# Patient Record
Sex: Male | Born: 1955
Health system: Southern US, Community
[De-identification: ages and names within clinical notes are randomized; demographics above are authoritative.]

## PROBLEM LIST (undated history)

## (undated) DIAGNOSIS — C9000 Multiple myeloma not having achieved remission: Secondary | ICD-10-CM

## (undated) DIAGNOSIS — IMO0002 Reserved for concepts with insufficient information to code with codable children: Secondary | ICD-10-CM

## (undated) DIAGNOSIS — M899 Disorder of bone, unspecified: Secondary | ICD-10-CM

## (undated) DIAGNOSIS — M1990 Unspecified osteoarthritis, unspecified site: Secondary | ICD-10-CM

## (undated) HISTORY — DX: Multiple myeloma not having achieved remission: C90.00

## (undated) HISTORY — DX: Disorder of bone, unspecified: M89.9

## (undated) HISTORY — DX: Unspecified osteoarthritis, unspecified site: M19.90

## (undated) HISTORY — DX: Reserved for concepts with insufficient information to code with codable children: IMO0002

## (undated) HISTORY — PX: NECK SURGERY: SHX720

## (undated) HISTORY — PX: OTHER SURGICAL HISTORY: SHX169

---

## 2012-06-13 ENCOUNTER — Ambulatory Visit: Payer: Self-pay

## 2012-06-13 LAB — DOT URINE DIP
Blood: NEGATIVE
Glucose,UR: NEGATIVE mg/dL (ref 0–75)
Specific Gravity: 1.01 (ref 1.003–1.030)

## 2014-10-05 ENCOUNTER — Ambulatory Visit: Payer: Self-pay

## 2015-10-12 ENCOUNTER — Encounter: Payer: Self-pay | Admitting: Podiatry

## 2015-10-12 ENCOUNTER — Ambulatory Visit (INDEPENDENT_AMBULATORY_CARE_PROVIDER_SITE_OTHER): Payer: BLUE CROSS/BLUE SHIELD | Admitting: Podiatry

## 2015-10-12 VITALS — BP 115/72 | HR 62 | Resp 18

## 2015-10-12 DIAGNOSIS — L603 Nail dystrophy: Secondary | ICD-10-CM | POA: Diagnosis not present

## 2015-10-12 DIAGNOSIS — B353 Tinea pedis: Secondary | ICD-10-CM

## 2015-10-12 DIAGNOSIS — B351 Tinea unguium: Secondary | ICD-10-CM

## 2015-10-12 NOTE — Addendum Note (Signed)
Addended by: Cranford Mon R on: 10/12/2015 04:14 PM   Modules accepted: Orders

## 2015-10-12 NOTE — Progress Notes (Signed)
   Subjective:    Patient ID: Kyle Parker, male    DOB: 1956/03/29, 60 y.o.   MRN: [id number]  HPI  60 year old male presents the operative concerns of bilateral big toenail thickening and discoloration. He states the nails are painful with shoe gear and pressure. He's had no previous treatment for this. He does state his of athlete's foot previously has tried some over-the-counter sprays which help. No other complaints at this time.   Review of Systems  All other systems reviewed and are negative.      Objective:   Physical Exam General: AAO x3, NAD  Dermatological: Bilateral hallux nails are hypertrophic, dystrophic, brittle, discolored, elongated 2. There is no swelling erythema or drainage. There is no tenderness palpation at this time. There does appear to be evidence of mild tinea pedis in the first interspace bilaterally. No open lesions.   Vascular: Dorsalis Pedis artery and Posterior Tibial artery pedal pulses are 2/4 bilateral with immedate capillary fill time. Pedal hair growth present. No varicosities and no lower extremity edema present bilateral. There is no pain with calf compression, swelling, warmth, erythema.   Neruologic: Grossly intact via light touch bilateral. Vibratory intact via tuning fork bilateral. Protective threshold with Semmes Wienstein monofilament intact to all pedal sites bilateral. Patellar and Achilles deep tendon reflexes 2+ bilateral. No Babinski or clonus noted bilateral.   Musculoskeletal: No gross boney pedal deformities bilateral. No pain, crepitus, or limitation noted with foot and ankle range of motion bilateral. Muscular strength 5/5 in all groups tested bilateral.  Gait: Unassisted, Nonantalgic.      Assessment & Plan:  60 year old male bilateral hallux onychodystrophy, likely onychomycosis; tinea pedis -Treatment options discussed including all alternatives, risks, and complications -Etiology of symptoms were discussed -Bilateral hallux  and debrided and sent for biopsy. Discussed treatment options. Likely undergo a name so. Discussed with him risks and effects. -Recommended over-the-counter athlete's foot spray/cream for now. -Follow-up after the nail culture.  Celesta Gentile, DPM

## 2015-10-12 NOTE — Patient Instructions (Signed)
Onychomycosis/Fungal Toenails  WHAT IS IT? An infection that lies within the keratin of your nail plate that is caused by a fungus.  WHY ME? Fungal infections affect all ages, sexes, races, and creeds.  There may be many factors that predispose you to a fungal infection such as age, coexisting medical conditions such as diabetes, or an autoimmune disease; stress, medications, fatigue, genetics, etc.  Bottom line: fungus thrives in a warm, moist environment and your shoes offer such a location.  IS IT CONTAGIOUS? Theoretically, yes.  You do not want to share shoes, nail clippers or files with someone who has fungal toenails.  Walking around barefoot in the same room or sleeping in the same bed is unlikely to transfer the organism.  It is important to realize, however, that fungus can spread easily from one nail to the next on the same foot.  HOW DO WE TREAT THIS?  There are several ways to treat this condition.  Treatment may depend on many factors such as age, medications, pregnancy, liver and kidney conditions, etc.  It is best to ask your doctor which options are available to you.  1. No treatment.   Unlike many other medical concerns, you can live with this condition.  However for many people this can be a painful condition and may lead to ingrown toenails or a bacterial infection.  It is recommended that you keep the nails cut short to help reduce the amount of fungal nail. 2. Topical treatment.  These range from herbal remedies to prescription strength nail lacquers.  About 40-50% effective, topicals require twice daily application for approximately 9 to 12 months or until an entirely new nail has grown out.  The most effective topicals are medical grade medications available through physicians offices. 3. Oral antifungal medications.  With an 80-90% cure rate, the most common oral medication requires 3 to 4 months of therapy and stays in your system for a year as the new nail grows out.  Oral  antifungal medications do require blood work to make sure it is a safe drug for you.  A liver function panel will be performed prior to starting the medication and after the first month of treatment.  It is important to have the blood work performed to avoid any harmful side effects.  In general, this medication safe but blood work is required. 4. Laser Therapy.  This treatment is performed by applying a specialized laser to the affected nail plate.  This therapy is noninvasive, fast, and non-painful.  It is not covered by insurance and is therefore, out of pocket.  The results have been very good with a 80-95% cure rate.  [facility] is the only practice in the area to offer this therapy. Permanent Nail Avulsion.  Removing the entire nail so that a new nail will not grow back.Athlete's Foot Athlete's foot (tinea pedis) is a fungal infection of the skin on the feet. It often occurs on the skin between the toes or underneath the toes. It can also occur on the soles of the feet. Athlete's foot is more likely to occur in hot, humid weather. Not washing your feet or changing your socks often enough can contribute to athlete's foot. The infection can spread from person to person (contagious). CAUSES Athlete's foot is caused by a fungus. This fungus thrives in warm, moist places. Most people get athlete's foot by sharing shower stalls, towels, and wet floors with an infected person. People with weakened immune systems, including  those with diabetes, may be more likely to get athlete's foot. SYMPTOMS  5. Itchy areas between the toes or on the soles of the feet. 6. White, flaky, or scaly areas between the toes or on the soles of the feet. 7. Tiny, intensely itchy blisters between the toes or on the soles of the feet. 8. Tiny cuts on the skin. These cuts can develop a bacterial infection. 9. Thick or discolored toenails. DIAGNOSIS  Your caregiver can usually tell what the problem is by doing a physical  exam. Your caregiver may also take a skin sample from the rash area. The skin sample may be examined under a microscope, or it may be tested to see if fungus will grow in the sample. A sample may also be taken from your toenail for testing. TREATMENT  Over-the-counter and prescription medicines can be used to kill the fungus. These medicines are available as powders or creams. Your caregiver can suggest medicines for you. Fungal infections respond slowly to treatment. You may need to continue using your medicine for several weeks. PREVENTION   Do not share towels.  Wear sandals in wet areas, such as shared locker rooms and shared showers.  Keep your feet dry. Wear shoes that allow air to circulate. Wear cotton or wool socks. HOME CARE INSTRUCTIONS   Take medicines as directed by your caregiver. Do not use steroid creams on athlete's foot.  Keep your feet clean and cool. Wash your feet daily and dry them thoroughly, especially between your toes.  Change your socks every day. Wear cotton or wool socks. In hot climates, you may need to change your socks 2 to 3 times per day.  Wear sandals or canvas tennis shoes with good air circulation.  If you have blisters, soak your feet in Burow's solution or Epsom salts for 20 to 30 minutes, 2 times a day to dry out the blisters. Make sure you dry your feet thoroughly afterward. SEEK MEDICAL CARE IF:   You have a fever.  You have swelling, soreness, warmth, or redness in your foot.  You are not getting better after 7 days of treatment.  You are not completely cured after 30 days.  You have any problems caused by your medicines. MAKE SURE YOU:   Understand these instructions.  Will watch your condition.  Will get help right away if you are not doing well or get worse.   This information is not intended to replace advice given to you by your health care provider. Make sure you discuss any questions you have with your health care provider.    Document Released: 07/21/2000 Document Revised: 10/16/2011 Document Reviewed: 01/25/2015 Elsevier Interactive Patient Education 2016 Elsevier Inc. Terbinafine oral granules What is this medicine? TERBINAFINE (TER bin a feen) is an antifungal medicine. It is used to treat certain kinds of fungal or yeast infections. This medicine may be used for other purposes; ask your health care provider or pharmacist if you have questions. What should I tell my health care provider before I take this medicine? They need to know if you have any of these conditions: -drink alcoholic beverages -kidney disease -liver disease -an unusual or allergic reaction to Terbinafine, other medicines, foods, dyes, or preservatives -pregnant or trying to get pregnant -breast-feeding How should I use this medicine? Take this medicine by mouth. Follow the directions on the prescription label. Hold packet with cut line on top. Shake packet gently to settle contents. Tear packet open along cut line, or use scissors  to cut across line. Carefully pour the entire contents of packet onto a spoonful of a soft food, such as pudding or other soft, non-acidic food such as mashed potatoes (do NOT use applesauce or a fruit-based food). If two packets are required for each dose, you may either sprinkle the content of both packets on one spoonful of non-acidic food, or sprinkle the contents of both packets on two spoonfuls of non-acidic food. Make sure that no granules remain in the packet. Swallow the mxiture of the food and granules without chewing. Take your medicine at regular intervals. Do not take it more often than directed. Take all of your medicine as directed even if you think you are better. Do not skip doses or stop your medicine early. Contact your pediatrician or health care professional regarding the use of this medicine in children. While this medicine may be prescribed for children as young as 4 years for selected conditions,  precautions do apply. Overdosage: If you think you have taken too much of this medicine contact a poison control center or emergency room at once. NOTE: This medicine is only for you. Do not share this medicine with others. What if I miss a dose? If you miss a dose, take it as soon as you can. If it is almost time for your next dose, take only that dose. Do not take double or extra doses. What may interact with this medicine? Do not take this medicine with any of the following medications: -thioridazine This medicine may also interact with the following medications: -beta-blockers -caffeine -cimetidine -cyclosporine -MAOIs like Carbex, Eldepryl, Marplan, Nardil, and Parnate -medicines for fungal infections like fluconazole and ketoconazole -medicines for irregular heartbeat like amiodarone, flecainide and propafenone -rifampin -SSRIs like citalopram, escitalopram, fluoxetine, fluvoxamine, paroxetine and sertraline -tricyclic antidepressants like amitriptyline, clomipramine, desipramine, imipramine, nortriptyline, and others -warfarin This list may not describe all possible interactions. Give your health care provider a list of all the medicines, herbs, non-prescription drugs, or dietary supplements you use. Also tell them if you smoke, drink alcohol, or use illegal drugs. Some items may interact with your medicine. What should I watch for while using this medicine? Your doctor may monitor your liver function. Tell your doctor right away if you have nausea or vomiting, loss of appetite, stomach pain on your right upper side, yellow skin, dark urine, light stools, or are over tired. You need to take this medicine for 6 weeks or longer to cure the fungal infection. Take your medicine regularly for as long as your doctor or health care professional tells you to. What side effects may I notice from receiving this medicine? Side effects that you should report to your doctor or health care  professional as soon as possible: -allergic reactions like skin rash or hives, swelling of the face, lips, or tongue -change in vision -dark urine -fever or infection -general ill feeling or flu-like symptoms -light-colored stools -loss of appetite, nausea -redness, blistering, peeling or loosening of the skin, including inside the mouth -right upper belly pain -unusually weak or tired -yellowing of the eyes or skin Side effects that usually do not require medical attention (report to your doctor or health care professional if they continue or are bothersome): -changes in taste -diarrhea -hair loss -muscle or joint pain -stomach upset This list may not describe all possible side effects. Call your doctor for medical advice about side effects. You may report side effects to FDA at 1-800-FDA-1088. Where should I keep my medicine? Keep out  of the reach of children. Store at room temperature between 15 and 30 degrees C (59 and 86 degrees F). Throw away any unused medicine after the expiration date. NOTE: This sheet is a summary. It may not cover all possible information. If you have questions about this medicine, talk to your doctor, pharmacist, or health care provider.    2016, Elsevier/Gold Standard. (2007-10-04 17:25:48)

## 2015-11-12 ENCOUNTER — Encounter: Payer: Self-pay | Admitting: Podiatry

## 2015-12-06 ENCOUNTER — Telehealth: Payer: Self-pay | Admitting: *Deleted

## 2015-12-06 DIAGNOSIS — Z79899 Other long term (current) drug therapy: Secondary | ICD-10-CM

## 2015-12-06 NOTE — Telephone Encounter (Signed)
I discussed with him lamisil. OK to go ahead and do 30 days of lamisil but he needs to come by to pick up lab work for a CBC with diff and liver function tests. He should not start the medication until I call him with the results of the blood work. Follow-up with me 4 weeks after starting lamisil.

## 2015-12-06 NOTE — Telephone Encounter (Addendum)
Pt states he had a fungal culture in March and had not heard the results.  I informed pt's results from Dr. Leigh Aurora review, as + and that pt should schedule for an appt.  Pt states Dr. Jacqualyn Posey said he would not have to come back that Dr. Jacqualyn Posey would prescribe and instruct over the phone.  Pt asked how long the results had been in and I told him 10/30/2015 and explained the results had come into [place] office and had been reviewed there and Dr. Leigh Aurora results and instruction should have been called to me or faxed.  I apologized and told pt I would inform Dr. Jacqualyn Posey and call with instructions.  12/07/2015-I informed pt, Dr. Jacqualyn Posey states have blood work performed and we would call with results and then he may be cleared to begin Lamisil for #30 doses, and will need to be seen in office after 30 doses to discuss pt's status and further orders.  Pt states understanding. Orders for [facility] at [facility] faxed.  12/13/2015-Informed pt of Dr. Leigh Aurora orders.

## 2015-12-09 MED ORDER — TERBINAFINE HCL 250 MG PO TABS: 250.0000 mg | ORAL_TABLET | Freq: Every day | ORAL | Status: DC

## 2015-12-09 NOTE — Addendum Note (Signed)
Addended by: Cranford Mon R on: 12/09/2015 05:06 PM   Modules accepted: Orders

## 2015-12-10 DIAGNOSIS — B351 Tinea unguium: Secondary | ICD-10-CM | POA: Diagnosis not present

## 2015-12-11 LAB — CBC WITH DIFFERENTIAL/PLATELET
BASOS ABS: 0 10*3/uL (ref 0.0–0.2)
Basos: 0 %
EOS (ABSOLUTE): 0.1 10*3/uL (ref 0.0–0.4)
Eos: 2 %
HEMOGLOBIN: 13.4 g/dL (ref 12.6–17.7)
Hematocrit: 38.9 % (ref 37.5–51.0)
IMMATURE GRANS (ABS): 0 10*3/uL (ref 0.0–0.1)
IMMATURE GRANULOCYTES: 0 %
Lymphocytes Absolute: 2.7 10*3/uL (ref 0.7–3.1)
Lymphs: 48 %
MCH: 31.6 pg (ref 26.6–33.0)
MCHC: 34.4 g/dL (ref 31.5–35.7)
MCV: 92 fL (ref 79–97)
MONOCYTES: 10 %
Monocytes Absolute: 0.5 10*3/uL (ref 0.1–0.9)
NEUTROS PCT: 40 %
Neutrophils Absolute: 2.3 10*3/uL (ref 1.4–7.0)
PLATELETS: 224 10*3/uL (ref 150–379)
RBC: 4.24 x10E6/uL (ref 4.14–5.80)
RDW: 14.3 % (ref 12.3–15.4)
WBC: 5.6 10*3/uL (ref 3.4–10.8)

## 2015-12-11 LAB — HEPATIC FUNCTION PANEL
ALK PHOS: 76 IU/L (ref 39–117)
ALT: 14 IU/L (ref 0–44)
AST: 14 IU/L (ref 0–40)
Albumin: 4.3 g/dL (ref 3.5–5.5)
BILIRUBIN, DIRECT: 0.12 mg/dL (ref 0.00–0.40)
Bilirubin Total: 0.4 mg/dL (ref 0.0–1.2)
TOTAL PROTEIN: 7.3 g/dL (ref 6.0–8.5)

## 2015-12-13 NOTE — Telephone Encounter (Signed)
-----   Message from Trula Slade, DPM sent at 12/13/2015  7:22 AM EDT ----- Can you please let him know it is OK to start lamisil.

## 2016-01-20 ENCOUNTER — Encounter: Payer: Self-pay | Admitting: Podiatry

## 2016-01-20 ENCOUNTER — Ambulatory Visit (INDEPENDENT_AMBULATORY_CARE_PROVIDER_SITE_OTHER): Payer: BLUE CROSS/BLUE SHIELD | Admitting: Podiatry

## 2016-01-20 DIAGNOSIS — B351 Tinea unguium: Secondary | ICD-10-CM

## 2016-01-20 DIAGNOSIS — Z79899 Other long term (current) drug therapy: Secondary | ICD-10-CM

## 2016-01-20 MED ORDER — TERBINAFINE HCL 250 MG PO TABS: 250.0000 mg | ORAL_TABLET | Freq: Every day | ORAL | Status: DC

## 2016-01-20 NOTE — Patient Instructions (Signed)

## 2016-01-23 DIAGNOSIS — B351 Tinea unguium: Secondary | ICD-10-CM | POA: Insufficient documentation

## 2016-01-23 NOTE — Progress Notes (Signed)
Patient ID: Kyle Parker, male   DOB: 1956-01-02, 60 y.o.   MRN: [id number]  Subjective: 60 year old male presents the office today 4 weeks after starting Lamisil. He denies any side effects the medicine. He believes that he has noticed some change in the big toenail on the left side. Denies any pain to the toenails and denies any redness or drainage. Denies any systemic complaints such as fevers, chills, nausea, vomiting. No acute changes since last appointment, and no other complaints at this time.   Objective: AAO x3, NAD DP/PT pulses palpable bilaterally, CRT less than 3 seconds Follow hallux nails continue be hypertrophic, dystrophic, brittle, discolored with some evidence of clearing along the proximal nail border the left hallux toenail. There is no tenderness the nails there is no surrounding redness or drainage. No areas of pinpoint bony tenderness or pain with vibratory sensation. MMT 5/5, ROM WNL. No edema, erythema, increase in warmth to bilateral lower extremities.  No open lesions or pre-ulcerative lesions.  No pain with calf compression, swelling, warmth, erythema  Assessment: Onychomycosis bilateral hallux  Plan: -All treatment options discussed with the patient including all alternatives, risks, complications.  -Nails were debrided 2 without complications or bleeding. -Refilled Lamisil today. Continue for 60 more days. This was refilled today. Repeat blood work today. Follow-up in 2 months and may continue for fourth month if needed. Monitor side effects to call the office should any occur. -Patient encouraged to call the office with any questions, concerns, change in symptoms.   Celesta Gentile, DPM

## 2016-02-07 ENCOUNTER — Ambulatory Visit (INDEPENDENT_AMBULATORY_CARE_PROVIDER_SITE_OTHER): Payer: BLUE CROSS/BLUE SHIELD | Admitting: Family Medicine

## 2016-02-07 VITALS — BP 116/62 | HR 67 | Temp 98.3°F | Resp 16 | Ht 73.0 in | Wt 289.2 lb

## 2016-02-07 DIAGNOSIS — S46811A Strain of other muscles, fascia and tendons at shoulder and upper arm level, right arm, initial encounter: Secondary | ICD-10-CM

## 2016-02-07 DIAGNOSIS — H938X3 Other specified disorders of ear, bilateral: Secondary | ICD-10-CM

## 2016-02-07 DIAGNOSIS — H9313 Tinnitus, bilateral: Secondary | ICD-10-CM

## 2016-02-07 DIAGNOSIS — Z79899 Other long term (current) drug therapy: Secondary | ICD-10-CM | POA: Diagnosis not present

## 2016-02-07 MED ORDER — CYCLOBENZAPRINE HCL 10 MG PO TABS: 10.0000 mg | ORAL_TABLET | Freq: Every day | ORAL | Status: DC

## 2016-02-07 MED ORDER — PSEUDOEPHEDRINE HCL 30 MG PO TABS: 30.0000 mg | ORAL_TABLET | Freq: Three times a day (TID) | ORAL | Status: DC | PRN

## 2016-02-07 MED ORDER — FLUTICASONE PROPIONATE 50 MCG/ACT NA SUSP: 2.0000 | Freq: Every day | NASAL | Status: DC

## 2016-02-07 NOTE — Patient Instructions (Signed)
Neck pain: I think you have pulled a muscle. Take 3 OTC advil 2-3 times daily for about 2 weeks to help with pain. Take your muscle relaxant flexeril at night to help with pain. Apply heat and massage to area to help with comfort.   Ear fullness: Could be congestion or fluid in ear. Try taking sudafed evry 8 hours for a day or 2 to see if that helps with congestion. Start flonase 2 sprays into the nose daily to help with ear fullness. Do this for about 2 weeks to see if it helps.

## 2016-02-07 NOTE — Progress Notes (Signed)
Subjective:    Patient ID: Kyle Parker, male    DOB: 07-19-1956, 60 y.o.   MRN: [id number]  HPI: Kyle Parker is a 60 y.o. male presenting on 02/07/2016 for Neck Pain   HPI  Neck pain x 2-3 weeks. R sided pain starting mid shoulder and radiating up neck. Bothers him at night. Sometimes the head hurts. Aggravated by golf. Home treatment: Used icy hot- mild relief. Took ibuprofen yesterday.  Pt also reporting ears feel clogged. Ringing in both ears. Present for many years. Has been evaluated by ENT before.   No past medical history on file.  Current Outpatient Prescriptions on File Prior to Visit  Medication Sig  . terbinafine (LAMISIL) 250 MG tablet Take 1 tablet (250 mg total) by mouth daily.   No current facility-administered medications on file prior to visit.    Review of Systems  Constitutional: Negative for fever and chills.  HENT: Positive for tinnitus.        Ear congestion.  Respiratory: Negative for chest tightness, shortness of breath and wheezing.   Cardiovascular: Negative for chest pain, palpitations and leg swelling.  Gastrointestinal: Negative for nausea, vomiting and abdominal pain.  Endocrine: Negative.   Genitourinary: Negative for dysuria, urgency, discharge, penile pain and testicular pain.  Musculoskeletal: Positive for myalgias and neck pain. Negative for back pain, joint swelling, arthralgias and neck stiffness.  Skin: Negative.   Neurological: Negative for dizziness, weakness, numbness and headaches.  Psychiatric/Behavioral: Negative for sleep disturbance and dysphoric mood.   Per HPI unless specifically indicated above     Objective:    BP 116/62 mmHg  Pulse 67  Temp(Src) 98.3 F (36.8 C) (Oral)  Resp 16  Ht 6\' 1"  (1.854 m)  Wt 289 lb 3.2 oz (131.18 kg)  BMI 38.16 kg/m2  Wt Readings from Last 3 Encounters:  02/07/16 289 lb 3.2 oz (131.18 kg)    Physical Exam  Constitutional: He appears well-developed and well-nourished. No distress.    HENT:  Head: Normocephalic and atraumatic.  Right Ear: Hearing normal. No swelling or tenderness. Tympanic membrane is not erythematous, not retracted and not bulging. A middle ear effusion is present.  Left Ear: Hearing normal. Tympanic membrane is scarred. Tympanic membrane is not erythematous, not retracted and not bulging.  Nose: Right sinus exhibits no maxillary sinus tenderness and no frontal sinus tenderness. Left sinus exhibits no maxillary sinus tenderness and no frontal sinus tenderness.  Boggy turbinates.   Neck: Neck supple. Muscular tenderness present. No spinous process tenderness present. No erythema present. No Brudzinski's sign and no Kernig's sign noted.  Full ROM but pain is reported when turning the the R and full extension of the neck.   Cardiovascular: Normal rate and regular rhythm.  Exam reveals no gallop and no friction rub.   No murmur heard. Pulmonary/Chest: Effort normal and breath sounds normal.  Musculoskeletal: He exhibits no edema.       Right shoulder: He exhibits tenderness. He exhibits normal range of motion, no pain and no spasm.       Arms: Skin: Skin is warm. No rash noted. He is not diaphoretic. No erythema.  Psychiatric: He has a normal mood and affect. His behavior is normal. Judgment and thought content normal.   Results for orders placed or performed in visit on 10/12/15  CBC with Differential/Platelet  Result Value Ref Range   WBC 5.6 3.4 - 10.8 x10E3/uL   RBC 4.24 4.14 - 5.80 x10E6/uL   Hemoglobin 13.4 12.6 -  17.7 g/dL   Hematocrit 38.9 37.5 - 51.0 %   MCV 92 79 - 97 fL   MCH 31.6 26.6 - 33.0 pg   MCHC 34.4 31.5 - 35.7 g/dL   RDW 14.3 12.3 - 15.4 %   Platelets 224 150 - 379 x10E3/uL   Neutrophils 40 %   Lymphs 48 %   Monocytes 10 %   Eos 2 %   Basos 0 %   Neutrophils Absolute 2.3 1.4 - 7.0 x10E3/uL   Lymphocytes Absolute 2.7 0.7 - 3.1 x10E3/uL   Monocytes Absolute 0.5 0.1 - 0.9 x10E3/uL   EOS (ABSOLUTE) 0.1 0.0 - 0.4 x10E3/uL    Basophils Absolute 0.0 0.0 - 0.2 x10E3/uL   Immature Granulocytes 0 %   Immature Grans (Abs) 0.0 0.0 - 0.1 x10E3/uL  Hepatic Function Panel  Result Value Ref Range   Total Protein 7.3 6.0 - 8.5 g/dL   Albumin 4.3 3.5 - 5.5 g/dL   Bilirubin Total 0.4 0.0 - 1.2 mg/dL   Bilirubin, Direct 0.12 0.00 - 0.40 mg/dL   Alkaline Phosphatase 76 39 - 117 IU/L   AST 14 0 - 40 IU/L   ALT 14 0 - 44 IU/L      Assessment & Plan:   Problem List Items Addressed This Visit      Other   Tinnitus of both ears    Chronic issues. Not exacerbated today. Pt has been seen by ENT in the past. Does not want further evaluation at this time.        Other Visit Diagnoses    Ear fullness, bilateral    -  Primary    Likely 2/2 sinus congestion- will try treating with flonase and sudafed to determine if it helps symptoms.     Relevant Medications    pseudoephedrine (SUDAFED) 30 MG tablet    Trapezius muscle strain, right, initial encounter        Muscle strain. Supportive care at home. Pt prefers to use OTC advil. Heat and massage. Flexeril PRN for severe pain. Return if not improving.     Relevant Medications    cyclobenzaprine (FLEXERIL) 10 MG tablet       Meds ordered this encounter  Medications  . fluticasone (FLONASE) 50 MCG/ACT nasal spray    Sig: Place 2 sprays into both nostrils daily.    Dispense:  16 g    Refill:  11  . pseudoephedrine (SUDAFED) 30 MG tablet    Sig: Take 1 tablet (30 mg total) by mouth every 8 (eight) hours as needed for congestion.    Dispense:  20 tablet    Refill:  0  . cyclobenzaprine (FLEXERIL) 10 MG tablet    Sig: Take 1 tablet (10 mg total) by mouth at bedtime.    Dispense:  30 tablet    Refill:  0      Follow up plan: Return if symptoms worsen or fail to improve.

## 2016-02-07 NOTE — Assessment & Plan Note (Signed)
Chronic issues. Not exacerbated today. Pt has been seen by ENT in the past. Does not want further evaluation at this time.

## 2016-02-08 LAB — CBC WITH DIFFERENTIAL
BASOS: 0 %
Basophils Absolute: 0 10*3/uL (ref 0.0–0.2)
EOS (ABSOLUTE): 0.1 10*3/uL (ref 0.0–0.4)
Eos: 3 %
Hematocrit: 36.7 % — ABNORMAL LOW (ref 37.5–51.0)
Hemoglobin: 12.7 g/dL (ref 12.6–17.7)
Immature Grans (Abs): 0 10*3/uL (ref 0.0–0.1)
Immature Granulocytes: 0 %
LYMPHS ABS: 2.2 10*3/uL (ref 0.7–3.1)
Lymphs: 48 %
MCH: 32.2 pg (ref 26.6–33.0)
MCHC: 34.6 g/dL (ref 31.5–35.7)
MCV: 93 fL (ref 79–97)
MONOS ABS: 0.5 10*3/uL (ref 0.1–0.9)
Monocytes: 11 %
NEUTROS PCT: 38 %
Neutrophils Absolute: 1.8 10*3/uL (ref 1.4–7.0)
RBC: 3.95 x10E6/uL — AB (ref 4.14–5.80)
RDW: 14.3 % (ref 12.3–15.4)
WBC: 4.6 10*3/uL (ref 3.4–10.8)

## 2016-02-08 LAB — HEPATIC FUNCTION PANEL
ALT: 16 IU/L (ref 0–44)
AST: 21 IU/L (ref 0–40)
Albumin: 4.1 g/dL (ref 3.5–5.5)
Alkaline Phosphatase: 73 IU/L (ref 39–117)
Bilirubin Total: 0.3 mg/dL (ref 0.0–1.2)
Bilirubin, Direct: 0.08 mg/dL (ref 0.00–0.40)
Total Protein: 7.7 g/dL (ref 6.0–8.5)

## 2016-02-14 ENCOUNTER — Telehealth: Payer: Self-pay | Admitting: *Deleted

## 2016-02-14 NOTE — Telephone Encounter (Addendum)
-----   Message from Trula Slade, DPM sent at 02/14/2016  7:05 AM EDT ----- Please let him know his blood work is normal and can continue lamisil followup in 2 months. Left message informing pt of Dr. Leigh Aurora orders.

## 2016-02-22 DIAGNOSIS — M531 Cervicobrachial syndrome: Secondary | ICD-10-CM | POA: Diagnosis not present

## 2016-02-22 DIAGNOSIS — M9901 Segmental and somatic dysfunction of cervical region: Secondary | ICD-10-CM | POA: Diagnosis not present

## 2016-02-22 DIAGNOSIS — M436 Torticollis: Secondary | ICD-10-CM | POA: Diagnosis not present

## 2016-02-29 DIAGNOSIS — M9901 Segmental and somatic dysfunction of cervical region: Secondary | ICD-10-CM | POA: Diagnosis not present

## 2016-02-29 DIAGNOSIS — M531 Cervicobrachial syndrome: Secondary | ICD-10-CM | POA: Diagnosis not present

## 2016-02-29 DIAGNOSIS — M436 Torticollis: Secondary | ICD-10-CM | POA: Diagnosis not present

## 2016-02-29 DIAGNOSIS — M25512 Pain in left shoulder: Secondary | ICD-10-CM | POA: Diagnosis not present

## 2016-03-07 ENCOUNTER — Ambulatory Visit (INDEPENDENT_AMBULATORY_CARE_PROVIDER_SITE_OTHER): Payer: BLUE CROSS/BLUE SHIELD | Admitting: Family Medicine

## 2016-03-07 ENCOUNTER — Encounter: Payer: Self-pay | Admitting: Family Medicine

## 2016-03-07 DIAGNOSIS — M6248 Contracture of muscle, other site: Secondary | ICD-10-CM | POA: Diagnosis not present

## 2016-03-07 DIAGNOSIS — M62838 Other muscle spasm: Secondary | ICD-10-CM | POA: Insufficient documentation

## 2016-03-07 MED ORDER — MELOXICAM 15 MG PO TABS: 15.0000 mg | ORAL_TABLET | Freq: Every day | ORAL | 3 refills | Status: DC

## 2016-03-07 MED ORDER — METAXALONE 800 MG PO TABS: 800.0000 mg | ORAL_TABLET | Freq: Three times a day (TID) | ORAL | 3 refills | Status: DC

## 2016-03-07 NOTE — Progress Notes (Signed)
Name: Kyle Parker   MRN: [id number]    DOB: 1956/02/25   Date:03/07/2016       Progress Note  Subjective  Chief Complaint  Chief Complaint  Patient presents with  . Neck Pain    HPI Her for f/u of neck pain.  Started in R shoulder trap and now in bilat shoulder  Traps and radiates to bilateral neck traps with certain movements.  Chiropractic not help.  Flexeril cannot take during day due to drowsiness.  Takes sub-therapeutric dose of Ibuprofen  No problem-specific Assessment & Plan notes found for this encounter.   History reviewed. No pertinent past medical history.  History reviewed. No pertinent surgical history.  History reviewed. No pertinent family history.  Social History   Social History  . Marital status: Married    Spouse name: N/A  . Number of children: N/A  . Years of education: N/A   Occupational History  . Not on file.   Social History Main Topics  . Smoking status: Never Smoker  . Smokeless tobacco: Never Used  . Alcohol use No  . Drug use: No  . Sexual activity: Not on file   Other Topics Concern  . Not on file   Social History Narrative  . No narrative on file     Current Outpatient Prescriptions:  .  cyclobenzaprine (FLEXERIL) 10 MG tablet, Take 1 tablet (10 mg total) by mouth at bedtime., Disp: 30 tablet, Rfl: 0 .  pseudoephedrine (SUDAFED) 30 MG tablet, Take 1 tablet (30 mg total) by mouth every 8 (eight) hours as needed for congestion., Disp: 20 tablet, Rfl: 0 .  terbinafine (LAMISIL) 250 MG tablet, Take 1 tablet (250 mg total) by mouth daily., Disp: 60 tablet, Rfl: 0 .  meloxicam (MOBIC) 15 MG tablet, Take 1 tablet (15 mg total) by mouth daily., Disp: 30 tablet, Rfl: 3 .  metaxalone (SKELAXIN) 800 MG tablet, Take 1 tablet (800 mg total) by mouth 3 (three) times daily. For muscle spasm, Disp: 30 tablet, Rfl: 3  Not on File   Review of Systems  Constitutional: Negative for chills, fever, malaise/fatigue and weight loss.  HENT: Negative  for hearing loss.   Eyes: Negative for blurred vision and double vision.  Respiratory: Negative for cough, shortness of breath and wheezing.   Cardiovascular: Negative for chest pain, palpitations and leg swelling.  Gastrointestinal: Negative for abdominal pain, blood in stool and heartburn.  Genitourinary: Negative for dysuria, frequency and urgency.  Musculoskeletal: Positive for neck pain.  Skin: Negative for rash.  Neurological: Negative for dizziness, tremors, weakness and headaches.      Objective  Vitals:   03/07/16 1557  BP: (!) 119/53  Pulse: 75  Resp: 16  Temp: 98.5 F (36.9 C)  TempSrc: Oral  Weight: 289 lb (131.1 kg)  Height: 6\' 1"  (1.854 m)    Physical Exam  Constitutional: He is oriented to person, place, and time and well-developed, well-nourished, and in no distress. No distress.  HENT:  Head: Normocephalic and atraumatic.  Neck: Neck supple. Muscular tenderness present. Spinous process tenderness: bilateral neck and shoulder traps. Carotid bruit is not present. No thyromegaly present.    Cardiovascular: Normal rate, regular rhythm and normal heart sounds.   Pulmonary/Chest: Effort normal and breath sounds normal.  Lymphadenopathy:    He has cervical adenopathy.  Neurological: He is alert and oriented to person, place, and time.  Vitals reviewed.      Recent Results (from the past 2160 hour(s))  CBC with  Differential/Platelet     Status: None   Collection Time: 12/10/15  1:59 PM  Result Value Ref Range   WBC 5.6 3.4 - 10.8 x10E3/uL   RBC 4.24 4.14 - 5.80 x10E6/uL   Hemoglobin 13.4 12.6 - 17.7 g/dL   Hematocrit 38.9 37.5 - 51.0 %   MCV 92 79 - 97 fL   MCH 31.6 26.6 - 33.0 pg   MCHC 34.4 31.5 - 35.7 g/dL   RDW 14.3 12.3 - 15.4 %   Platelets 224 150 - 379 x10E3/uL   Neutrophils 40 %   Lymphs 48 %   Monocytes 10 %   Eos 2 %   Basos 0 %   Neutrophils Absolute 2.3 1.4 - 7.0 x10E3/uL   Lymphocytes Absolute 2.7 0.7 - 3.1 x10E3/uL   Monocytes  Absolute 0.5 0.1 - 0.9 x10E3/uL   EOS (ABSOLUTE) 0.1 0.0 - 0.4 x10E3/uL   Basophils Absolute 0.0 0.0 - 0.2 x10E3/uL   Immature Granulocytes 0 %   Immature Grans (Abs) 0.0 0.0 - 0.1 x10E3/uL  Hepatic Function Panel     Status: None   Collection Time: 12/10/15  1:59 PM  Result Value Ref Range   Total Protein 7.3 6.0 - 8.5 g/dL   Albumin 4.3 3.5 - 5.5 g/dL   Bilirubin Total 0.4 0.0 - 1.2 mg/dL   Bilirubin, Direct 0.12 0.00 - 0.40 mg/dL   Alkaline Phosphatase 76 39 - 117 IU/L   AST 14 0 - 40 IU/L   ALT 14 0 - 44 IU/L  CBC With Differential     Status: Abnormal   Collection Time: 02/07/16  9:50 AM  Result Value Ref Range   WBC 4.6 3.4 - 10.8 x10E3/uL   RBC 3.95 (L) 4.14 - 5.80 x10E6/uL   Hemoglobin 12.7 12.6 - 17.7 g/dL   Hematocrit 36.7 (L) 37.5 - 51.0 %   MCV 93 79 - 97 fL   MCH 32.2 26.6 - 33.0 pg   MCHC 34.6 31.5 - 35.7 g/dL   RDW 14.3 12.3 - 15.4 %   Neutrophils 38 %   Lymphs 48 %   Monocytes 11 %   Eos 3 %   Basos 0 %   Neutrophils Absolute 1.8 1.4 - 7.0 x10E3/uL   Lymphocytes Absolute 2.2 0.7 - 3.1 x10E3/uL   Monocytes Absolute 0.5 0.1 - 0.9 x10E3/uL   EOS (ABSOLUTE) 0.1 0.0 - 0.4 x10E3/uL   Basophils Absolute 0.0 0.0 - 0.2 x10E3/uL   Immature Granulocytes 0 %   Immature Grans (Abs) 0.0 0.0 - 0.1 x10E3/uL  Hepatic function panel     Status: None   Collection Time: 02/07/16  9:50 AM  Result Value Ref Range   Total Protein 7.7 6.0 - 8.5 g/dL   Albumin 4.1 3.5 - 5.5 g/dL   Bilirubin Total 0.3 0.0 - 1.2 mg/dL   Bilirubin, Direct 0.08 0.00 - 0.40 mg/dL   Alkaline Phosphatase 73 39 - 117 IU/L   AST 21 0 - 40 IU/L   ALT 16 0 - 44 IU/L     Assessment & Plan  Problem List Items Addressed This Visit      Musculoskeletal and Integument   Muscle spasms of neck   Relevant Medications   meloxicam (MOBIC) 15 MG tablet   metaxalone (SKELAXIN) 800 MG tablet   Other Relevant Orders   Ambulatory referral to Physical Therapy    Other Visit Diagnoses   None.     Meds  ordered this encounter  Medications  .  meloxicam (MOBIC) 15 MG tablet    Sig: Take 1 tablet (15 mg total) by mouth daily.    Dispense:  30 tablet    Refill:  3  . metaxalone (SKELAXIN) 800 MG tablet    Sig: Take 1 tablet (800 mg total) by mouth 3 (three) times daily. For muscle spasm    Dispense:  30 tablet    Refill:  3   1. Muscle spasms of neck  - meloxicam (MOBIC) 15 MG tablet; Take 1 tablet (15 mg total) by mouth daily.  Dispense: 30 tablet; Refill: 3 - metaxalone (SKELAXIN) 800 MG tablet; Take 1 tablet (800 mg total) by mouth 3 (three) times daily. For muscle spasm  Dispense: 30 tablet; Refill: 3 - Ambulatory referral to Physical Therapy

## 2016-03-12 ENCOUNTER — Emergency Department: Payer: BLUE CROSS/BLUE SHIELD

## 2016-03-12 ENCOUNTER — Emergency Department
Admission: EM | Admit: 2016-03-12 | Discharge: 2016-03-12 | Disposition: A | Discharge status: Home / Self Care | Payer: BLUE CROSS/BLUE SHIELD | Attending: Emergency Medicine | Admitting: Emergency Medicine

## 2016-03-12 ENCOUNTER — Encounter: Payer: Self-pay | Admitting: Emergency Medicine

## 2016-03-12 DIAGNOSIS — S129XXA Fracture of neck, unspecified, initial encounter: Secondary | ICD-10-CM | POA: Diagnosis not present

## 2016-03-12 DIAGNOSIS — S12200A Unspecified displaced fracture of third cervical vertebra, initial encounter for closed fracture: Secondary | ICD-10-CM | POA: Insufficient documentation

## 2016-03-12 DIAGNOSIS — W01198A Fall on same level from slipping, tripping and stumbling with subsequent striking against other object, initial encounter: Secondary | ICD-10-CM | POA: Insufficient documentation

## 2016-03-12 DIAGNOSIS — Y9389 Activity, other specified: Secondary | ICD-10-CM | POA: Insufficient documentation

## 2016-03-12 DIAGNOSIS — F17290 Nicotine dependence, other tobacco product, uncomplicated: Secondary | ICD-10-CM | POA: Insufficient documentation

## 2016-03-12 DIAGNOSIS — Y929 Unspecified place or not applicable: Secondary | ICD-10-CM | POA: Diagnosis not present

## 2016-03-12 DIAGNOSIS — M542 Cervicalgia: Secondary | ICD-10-CM | POA: Diagnosis not present

## 2016-03-12 DIAGNOSIS — Y999 Unspecified external cause status: Secondary | ICD-10-CM | POA: Insufficient documentation

## 2016-03-12 LAB — BASIC METABOLIC PANEL
ANION GAP: 6 (ref 5–15)
BUN: 14 mg/dL (ref 6–20)
CALCIUM: 9.3 mg/dL (ref 8.9–10.3)
CO2: 22 mmol/L (ref 22–32)
Chloride: 109 mmol/L (ref 101–111)
Creatinine, Ser: 0.96 mg/dL (ref 0.61–1.24)
GFR calc non Af Amer: >60 mL/min (ref >60)
GLUCOSE: 123 mg/dL — AB (ref 65–99)
POTASSIUM: 4 mmol/L (ref 3.5–5.1)
Sodium: 137 mmol/L (ref 135–145)

## 2016-03-12 MED ORDER — HYDROMORPHONE HCL 1 MG/ML IJ SOLN
1.0000 mg | Freq: Once | INTRAMUSCULAR | Status: AC
  Administered 2016-03-12: 1 mg via INTRAVENOUS
  Filled 2016-03-12: qty 1

## 2016-03-12 MED ORDER — HYDROCODONE-ACETAMINOPHEN 5-325 MG PO TABS: 1.0000 | ORAL_TABLET | Freq: Four times a day (QID) | ORAL | 0 refills | Status: DC | PRN

## 2016-03-12 MED ORDER — HYDROCODONE-ACETAMINOPHEN 5-325 MG PO TABS
1.0000 | ORAL_TABLET | Freq: Once | ORAL | Status: AC
Start: 2016-03-12 — End: 2016-03-12
  Administered 2016-03-12: 1 via ORAL

## 2016-03-12 MED ORDER — HYDROCODONE-ACETAMINOPHEN 5-325 MG PO TABS
ORAL_TABLET | ORAL | Status: AC
  Filled 2016-03-12: qty 1

## 2016-03-12 MED ORDER — GADOBENATE DIMEGLUMINE 529 MG/ML IV SOLN
20.0000 mL | Freq: Once | INTRAVENOUS | Status: AC | PRN
  Administered 2016-03-12: 20 mL via INTRAVENOUS
  Filled 2016-03-12: qty 20

## 2016-03-12 NOTE — ED Provider Notes (Signed)
[facility]  I accepted care from Randel Pigg PA  I examined this patient myself. Patient is here for neck pain.  Physical Exam: No acute distress Head normocephalic and atraumatic. Normal extraocular movements. Posterior cervical spine pain to palpation. No respiratory distress, no retractions. Obese and nontender abdomen. Neuro:  No weakness or numbness. Alert and oriented 3.  ____________________________________________   RADIOLOGY All xrays were viewed by me. Imaging interpreted by radiologist.  MRI cervical spine with and without contrast:  IMPRESSION: Cervical and clival lesions consistent with malignant/metastatic process. Large C3 body deposit with pathologic fracture and C3-4 right foraminal infiltration. Posterior bowing of the C3 body contacts the cord without mass effect.  ____________________________________________   PROCEDURES  Procedure(s) performed: None  Critical Care performed: None  ____________________________________________   INITIAL IMPRESSION / ASSESSMENT AND PLAN / ED COURSE   Pertinent labs & imaging results that were available during my care of the patient were reviewed by me and considered in my medical decision making (see chart for details).  I accepted care for this patient from [facility]. Awaiting MRI results.  I did see this patient myself.  I did phone call from the radiologist indicating likely metastatic cancer with bony metastasis and pathologic fracture at C3 abutting the spinal cord, but no mass effect to the spinal cord.  I examined the patient myself, no neurologic deficits on exam or by history.  I spoke with both the patient, his wife, and his sister, and gave them this news about the pathologic fracture and likely tumor which is likely metastatic from an unknown primary.  I did write for norco for pain control for acute cervical fracture  After speaking with the neurosurgeon at [facility], who  also reviewed the images, this current fracture appears to be stable. The patient is not having any neurologic symptoms. He was recommended to wear a cervical collar. He was recommended to follow up at [facility] neurosurgery.   CONSULTATIONS: 1:  Dr. Cyndy Freeze, neurosurgeon at [facility] who was able to review the MRI scan and the clinical history with me. He recommended no indication of acute neurosurgical intervention at this point time, however if it became surgical, the patient would likely need to be at a tertiary/academic center and recommends [facility] for the patient to follow up as an outpatient. He did recommend for the patient to go ahead and preventatively where an aspen collar.  2.  Oncology, Dr. Rogue Bussing, will see patient in the next 3 days in the office. Patient is to call and make an appointment for Tuesday or Wednesday for evaluation and workup    Patient / Family / Caregiver informed of clinical course, medical decision-making process, and agree with plan.   I discussed return precautions, follow-up instructions, and discharged instructions with patient and/or family.     ____________________________________________   FINAL CLINICAL IMPRESSION(S) / ED DIAGNOSES  Final diagnoses:  C3 cervical fracture, closed, initial encounter        Lisa Roca, MD 03/12/16 2003

## 2016-03-12 NOTE — Discharge Instructions (Signed)
You were found to have fracture of the C3 vertebrae in your neck, along with mass suspected to be tumor in the same C3 vertebrae. Please work collar at all times to help prevent any worsening of the fracture.  Return to the emergency department immediately for any worsening pain, any weakness or numbness, or any trouble breathing. Return for any altered mental status or confusion, seizure, fever, or any other symptoms concerning to you.  As we discussed you need to call Oncologist Office for Dr. Rogue Bussing  tomorrow for appointment Tuesday or Wednesday.  They will help coordinate additional blood work, as well as scans which might include a CT scan and PET scan, amongst other diagnostic testing. He will also need to be seen by [facility] neurosurgery, you may call yourself, or discussed with the oncologist arrangement for this referral.

## 2016-03-12 NOTE — ED Triage Notes (Signed)
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 BIB EMS from home. Pt reports neck pain off and on for about 4-5 weeks. Sees a Restaurant manager, fast food. Plans to start physical therapy on Thursday. Given Toradol 30mg  IV by EMS at 1305. Rates pain in neck 4/ 10 in neck

## 2016-03-12 NOTE — ED Notes (Signed)
Phila collar applied   Dr Reita Cliche in to speak to pt and family

## 2016-03-12 NOTE — ED Notes (Signed)
Back from MRI.

## 2016-03-12 NOTE — ED Provider Notes (Signed)
[facility] Emergency Department Provider Note   ____________________________________________   First MD Initiated Contact with Patient 03/12/16 1342     (approximate)  I have reviewed the triage vital signs and the nursing notes.   HISTORY  Chief Complaint Neck Pain    HPI Kyle Parker is a 60 y.o. male patient complaining of neck pain for 4-5 weeks. Patient thought it was muscular in nature and went to see a chiropractor 4 days ago.Patient state he is scheduled for therapy early next week. 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 Lateral movement of the neck increases his pain. Patient arrived via EMS and was given 30 mg of Toradol IV which stated has not helped his pain. Patient is concerned because no imaging done prior to being scheduled physical therapy. Patient relates no recent injury but stated approximately 2 years ago he was currently large pipe behind his neck. Patient say slipped and fell and hit his head. Patient said of headache but no loss of consciousness. Patient had no neck pain from the incident. Patient is currently rates his pain as a 4/10. Patient denies any radicular component to this pain. Patient points to the area of C4 complain of pain. No other palliative measures for this complaint.  History reviewed. No pertinent past medical history.  Patient Active Problem List   Diagnosis Date Noted  . Muscle spasms of neck 03/07/2016  . Tinnitus of both ears 02/07/2016  . Onychomycosis 01/23/2016    History reviewed. No pertinent surgical history.  Prior to Admission medications   Medication Sig Start Date End Date Taking? Authorizing Provider  cyclobenzaprine (FLEXERIL) 10 MG tablet Take 1 tablet (10 mg total) by mouth at bedtime. 02/07/16   Amy Overton Mam, NP  meloxicam (MOBIC) 15 MG tablet Take 1 tablet (15 mg total) by mouth daily. 03/07/16   Arlis Porta., MD  metaxalone (SKELAXIN) 800 MG tablet  Take 1 tablet (800 mg total) by mouth 3 (three) times daily. For muscle spasm 03/07/16   Arlis Porta., MD  pseudoephedrine (SUDAFED) 30 MG tablet Take 1 tablet (30 mg total) by mouth every 8 (eight) hours as needed for congestion. 02/07/16   Amy Overton Mam, NP  terbinafine (LAMISIL) 250 MG tablet Take 1 tablet (250 mg total) by mouth daily. 01/20/16   Trula Slade, DPM    Allergies Review of patient's allergies indicates no known allergies.  History reviewed. No pertinent family history.  Social History Social History  Substance Use Topics  . Smoking status: Current Every Day Smoker  . Smokeless tobacco: Current User    Types: Chew  . Alcohol use No    Review of Systems Constitutional: No fever/chills Eyes: No visual changes. ENT: No sore throat. Cardiovascular: Denies chest pain. Respiratory: Denies shortness of breath. Gastrointestinal: No abdominal pain.  No nausea, no vomiting.  No diarrhea.  No constipation. Genitourinary: Negative for dysuria. Musculoskeletal: Positive for neck pain  Skin: Negative for rash. Neurological: Negative for headaches, focal weakness or numbness.    ____________________________________________   PHYSICAL EXAM:  VITAL SIGNS: ED Triage Vitals  Enc Vitals Group     BP 03/12/16 1327 (!) 149/81     Pulse Rate 03/12/16 1327 76     Resp 03/12/16 1327 20     Temp 03/12/16 1327 97.8 F (36.6 C)     Temp Source 03/12/16 1327 Oral     SpO2 03/12/16 1327 96 %  Weight 03/12/16 1327 280 lb (127 kg)     Height 03/12/16 1327 6' (1.829 m)     Head Circumference --      Peak Flow --      Pain Score 03/12/16 1331 4     Pain Loc --      Pain Edu? --      Excl. in [place]? --     Constitutional: Alert and oriented. Well appearing and in no acute distress. Eyes: Conjunctivae are normal. PERRL. EOMI. Head: Atraumatic. Nose: No congestion/rhinnorhea. Mouth/Throat: Mucous membranes are moist.  Oropharynx non-erythematous. Neck: No stridor.   No cervical spine tenderness to palpation. Hematological/Lymphatic/Immunilogical: No cervical lymphadenopathy. Cardiovascular: Normal rate, regular rhythm. Grossly normal heart sounds.  Good peripheral circulation. Respiratory: Normal respiratory effort.  No retractions. Lungs CTAB. Gastrointestinal: Soft and nontender. No distention. No abdominal bruits. No CVA tenderness. Musculoskeletal: No obvious deformity to cervical spine. Patient has decreased range of motion with flexion and lateral movements. Extension seems to be full and equal. Patient has some moderate guarding palpation C3 and 4.  Neurologic:  Normal speech and language. No gross focal neurologic deficits are appreciated. No gait instability. Skin:  Skin is warm, dry and intact. No rash noted. Psychiatric: Mood and affect are normal. Speech and behavior are normal.  ____________________________________________   LABS (all labs ordered are listed, but only abnormal results are displayed)  Labs Reviewed  BASIC METABOLIC PANEL   ____________________________________________  EKG   ____________________________________________  RADIOLOGY  L-spine x-ray shows age indeterminate lucency and flattening of C3 vertebral body. Radial status might be chronic in nature but cannot rule out a pathological fracture. This imaging with follow-up with a contrast enhanced cervical spine MRI. ____________________________________________   PROCEDURES  Procedure(s) performed: None  Procedures  Critical Care performed: No  ____________________________________________   INITIAL IMPRESSION / ASSESSMENT AND PLAN / ED COURSE  Pertinent labs & imaging results that were available during my care of the patient were reviewed by me and considered in my medical decision making (see chart for details).  Acute neck pain. Patient care will return to over to Dr. Reita Cliche.  Clinical Course      ____________________________________________   FINAL CLINICAL IMPRESSION(S) / ED DIAGNOSES  Final diagnoses:  None      NEW MEDICATIONS STARTED DURING THIS VISIT:  New Prescriptions   No medications on file     Note:  This document was prepared using Dragon voice recognition software and may include unintentional dictation errors.    Sable Feil, PA-C 03/12/16 [place], PA-C 03/12/16 1520    Lavonia Drafts, MD 03/13/16 [id number]

## 2016-03-13 ENCOUNTER — Other Ambulatory Visit: Payer: Self-pay | Admitting: Internal Medicine

## 2016-03-14 ENCOUNTER — Inpatient Hospital Stay: Payer: BLUE CROSS/BLUE SHIELD

## 2016-03-14 ENCOUNTER — Encounter: Payer: Self-pay | Admitting: Internal Medicine

## 2016-03-14 ENCOUNTER — Inpatient Hospital Stay: Payer: BLUE CROSS/BLUE SHIELD | Attending: Internal Medicine | Admitting: Internal Medicine

## 2016-03-14 DIAGNOSIS — M129 Arthropathy, unspecified: Secondary | ICD-10-CM | POA: Diagnosis not present

## 2016-03-14 DIAGNOSIS — Z8051 Family history of malignant neoplasm of kidney: Secondary | ICD-10-CM | POA: Insufficient documentation

## 2016-03-14 DIAGNOSIS — R221 Localized swelling, mass and lump, neck: Secondary | ICD-10-CM | POA: Diagnosis not present

## 2016-03-14 DIAGNOSIS — C801 Malignant (primary) neoplasm, unspecified: Secondary | ICD-10-CM

## 2016-03-14 DIAGNOSIS — Z79899 Other long term (current) drug therapy: Secondary | ICD-10-CM | POA: Diagnosis not present

## 2016-03-14 DIAGNOSIS — M4852XS Collapsed vertebra, not elsewhere classified, cervical region, sequela of fracture: Secondary | ICD-10-CM | POA: Insufficient documentation

## 2016-03-14 DIAGNOSIS — M50322 Other cervical disc degeneration at C5-C6 level: Secondary | ICD-10-CM | POA: Diagnosis not present

## 2016-03-14 DIAGNOSIS — M542 Cervicalgia: Secondary | ICD-10-CM | POA: Diagnosis not present

## 2016-03-14 DIAGNOSIS — N888 Other specified noninflammatory disorders of cervix uteri: Secondary | ICD-10-CM

## 2016-03-14 LAB — LACTATE DEHYDROGENASE: LDH: 207 U/L — AB (ref 98–192)

## 2016-03-14 MED ORDER — DEXAMETHASONE 4 MG PO TABS: 4.0000 mg | ORAL_TABLET | Freq: Three times a day (TID) | ORAL | 0 refills | Status: DC

## 2016-03-14 MED ORDER — OXYCODONE-ACETAMINOPHEN 10-325 MG PO TABS: 1.0000 | ORAL_TABLET | ORAL | 0 refills | Status: DC | PRN

## 2016-03-14 NOTE — Progress Notes (Signed)
Patient here today as new evaluation regarding bone lesion.  Referred by Dr. Reita Cliche - ED.

## 2016-03-14 NOTE — Progress Notes (Signed)
[place] NOTE  Patient Care Team: Arlis Porta., MD as PCP - General (Family Medicine)  CHIEF COMPLAINTS/PURPOSE OF CONSULTATION: Neck mass  #   No history exists.     HISTORY OF PRESENTING ILLNESS:  Kyle Parker 60 y.o.  male with no significant past medical history- noted to have pain in his neck in the last 4-6 weeks; especially exacerbated by movement-while driving and playing golf. It was progressively getting worse. Patient denies any weakness in his legs or arms.  He was in the emergency room- had a MRI of the neck that showed pathologic compression fracture of C3/soft tissue mass- other findings as discussed above. He has been referred to [place] for further evaluation and recommendations.   Patient denies any weight loss. Denies any unusual shortness of breath. Denies any chest pain. Denies any cough. Denies any constipation or blood in stools black red stools. He does not remember having PSA drawn; he has not had a screening colonoscopy.   Patient's pain is currently not well controlled on Percocet 7.5. Our  ROS: A complete 10 point review of system is done which is negative except mentioned above in history of present illness  MEDICAL HISTORY:  Past Medical History:  Diagnosis Date  . Arthritis     SURGICAL HISTORY: No past surgical history on file.  SOCIAL HISTORY: lives in [place]; shows tobacco smoking. No alcohol. Lives with his wife at home. Works for [employer]  . Marital status: Married    Spouse name: N/A  . Number of children: N/A  . Years of education: N/A   Occupational History  . Not on file.   Social History Main Topics  . Smoking status: Never Smoker  . Smokeless tobacco: Current User    Types: Chew  . Alcohol use No  . Drug use: Unknown  . Sexual activity: Not on file   Other Topics Concern  . Not on file   Social History Narrative  . No narrative on file     FAMILY HISTORY: No family history on file. Father had kidney cancer.  ALLERGIES:  has No Known Allergies.  MEDICATIONS:  Current Outpatient Prescriptions  Medication Sig Dispense Refill  . cyclobenzaprine (FLEXERIL) 10 MG tablet Take 1 tablet (10 mg total) by mouth at bedtime. 30 tablet 0  . HYDROcodone-acetaminophen (NORCO/VICODIN) 5-325 MG tablet Take 1-2 tablets by mouth every 6 (six) hours as needed for moderate pain. 15 tablet 0  . meloxicam (MOBIC) 15 MG tablet Take 1 tablet (15 mg total) by mouth daily. 30 tablet 3  . metaxalone (SKELAXIN) 800 MG tablet Take 1 tablet (800 mg total) by mouth 3 (three) times daily. For muscle spasm 30 tablet 3  . pseudoephedrine (SUDAFED) 30 MG tablet Take 1 tablet (30 mg total) by mouth every 8 (eight) hours as needed for congestion. 20 tablet 0  . terbinafine (LAMISIL) 250 MG tablet Take 1 tablet (250 mg total) by mouth daily. 60 tablet 0  . dexamethasone (DECADRON) 4 MG tablet Take 1 tablet (4 mg total) by mouth 3 (three) times daily. Take it with food. 20 tablet 0  . oxyCODONE-acetaminophen (PERCOCET) 10-325 MG tablet Take 1 tablet by mouth every 4 (four) hours as needed for pain. 60 tablet 0   No current facility-administered medications for this visit.       Marland Kitchen  PHYSICAL EXAMINATION: ECOG PERFORMANCE STATUS: 1 - Symptomatic but completely ambulatory  Vitals:  03/14/16 1138  BP: (!) 146/80  Pulse: 76  Resp: 18  Temp: 98 F (36.7 C)   Filed Weights   03/14/16 1138  Weight: 290 lb 2 oz (131.6 kg)    GENERAL: Well-nourished well-developed; Alert, no distress and comfortable.  He is accompanied by his wife. EYES: no pallor or icterus OROPHARYNX: no thrush or ulceration; good dentition  NECK: supple, no masses felt LYMPH:  no palpable lymphadenopathy in the cervical, axillary or inguinal regions LUNGS: clear to auscultation and  No wheeze or crackles HEART/CVS: regular rate & rhythm and no murmurs; No lower extremity  edema ABDOMEN: abdomen soft, non-tender and normal bowel sounds Musculoskeletal:no cyanosis of digits and no clubbing  PSYCH: alert & oriented x 3 with fluent speech NEURO: no focal motor/sensory deficits SKIN:  no rashes or significant lesions  LABORATORY DATA:  I have reviewed the data as listed Lab Results  Component Value Date   WBC 4.6 02/07/2016   HCT 36.7 (L) 02/07/2016   MCV 93 02/07/2016   PLT 224 12/10/2015    Recent Labs  12/10/15 1359 02/07/16 0950 03/12/16 1454  NA  --   --  137  K  --   --  4.0  CL  --   --  109  CO2  --   --  22  GLUCOSE  --   --  123*  BUN  --   --  14  CREATININE  --   --  0.96  CALCIUM  --   --  9.3  GFRNONAA  --   --  >60  GFRAA  --   --  >60  PROT 7.3 7.7  --   ALBUMIN 4.3 4.1  --   AST 14 21  --   ALT 14 16  --   ALKPHOS 76 73  --   BILITOT 0.4 0.3  --   BILIDIR 0.12 0.08  --     RADIOGRAPHIC STUDIES: I have personally reviewed the radiological images as listed and agreed with the findings in the report. Dg Cervical Spine Complete  Result Date: 03/12/2016 CLINICAL DATA:  Patient with neck tightness and pain with neck extension. Initial encounter. No known injury. EXAM: CERVICAL SPINE - COMPLETE 4+ VIEW COMPARISON:  None. FINDINGS: There is lucency and flattening of the C3 vertebral body. This is age indeterminate. Straightening of the normal cervical lordosis. Degenerative disc disease most pronounced C5-6. Lung apices are clear. Lateral masses articulate appropriately with the dens. IMPRESSION: Age-indeterminate lucency and flattening of the C3 vertebral body which may potentially be chronic in etiology however lesion with associated pathologic fracture is a consideration. This needs dedicated evaluation with contrast-enhanced cervical spine MRI. These results were called by telephone at the time of interpretation on 03/12/2016 at 2:43 pm to Dr. Marcene Brawn, who verbally acknowledged these results. Electronically Signed   By: Lovey Newcomer M.D.    On: 03/12/2016 14:44   Mr Cervical Spine W Wo Contrast  Result Date: 03/12/2016 CLINICAL DATA:  Neck pain. Abnormal radiography with concern for pathologic lesion. EXAM: MRI CERVICAL SPINE WITHOUT AND WITH CONTRAST TECHNIQUE: Multiplanar and multiecho pulse sequences of the cervical spine, to include the craniocervical junction and cervicothoracic junction, were obtained without and with intravenous contrast. CONTRAST:  49mL MULTIHANCE GADOBENATE DIMEGLUMINE 529 MG/ML IV SOLN COMPARISON:  Radiography from earlier today FINDINGS: Alignment: Physiologic. Vertebrae: The marrow is diffusely abnormally hypointense on T1 weighted imaging. Discrete enhancing lesions also seen in the C3 body, C5 posterior body, clivus,and  right T1 articular process. Pathologic fracture at the C3 level with rightward extraosseous tumor infiltrating the inferior right C3-4 foramen and prevertebral musculature. The right vertebral artery is displaced but patent. There is posterior bowing of the cortex at this level which contacts the ventral cord without compression. Maximal height loss at C3 is up to 50%. Cord: Normal signal and morphology. No abnormal intrathecal enhancement. Posterior Fossa, vertebral arteries, paraspinal tissues: Negative for adenopathy. Tumoral mass effect on the right vertebral artery without evidence of narrowing. Disc levels: C6-7 degenerative disc narrowing and bulging with mild bilateral foraminal stenosis. IMPRESSION: Cervical and clival lesions consistent with malignant/metastatic process. Large C3 body deposit with pathologic fracture and C3-4 right foraminal infiltration. Posterior bowing of the C3 body contacts the cord without mass effect. Electronically Signed   By: Monte Fantasia M.D.   On: 03/12/2016 17:47    ASSESSMENT & PLAN:   Cancer with unknown primary site ([place]) C3 vertebral compression fracture/soft tissue mass; and other cervical vertebral lesions- highly concerning for malignancy. The  etiology is unclear.  # Check CBC CMP and LDH tumor markers- CEA, CA-19-9; beta hCG; AFP. Check CT of the chest abdomen pelvis ASAP with contrast. Also recommend myeloma workup.  # Neck pain- from cervical mass- no neurologic deficits. Recommend dexamethasone 4 mg 3 times a day; and also start patient on percocet 10 every 4 hours as needed.   # also has appt at [facility] in 2 days.   # Discussed Biopsy will be needed to confirm the diagnosis; once diagnosis available we will go over the plan. Encourage the patient to take copies of the images/CD to appt at [facility].  # Patient follow-up with me in approximately 1 week to review the above workup is sooner based upon the results.  Thank you Dr. Reita Cliche for allowing me to participate in the care of your pleasant patient. Please do not hesitate to contact me with questions or concerns in the interim.     All questions were answered. The patient knows to call the clinic with any problems, questions or concerns.       Cammie Sickle, MD 03/14/2016 3:12 PM

## 2016-03-14 NOTE — Assessment & Plan Note (Addendum)
C3 vertebral compression fracture/soft tissue mass; and other cervical vertebral lesions- highly concerning for malignancy. The etiology is unclear.  # Check CBC CMP and LDH tumor markers- CEA, CA-19-9; beta hCG; AFP. Check CT of the chest abdomen pelvis ASAP with contrast. Also recommend myeloma workup.  # Neck pain- from cervical mass- no neurologic deficits. Recommend dexamethasone 4 mg 3 times a day; and also start patient on percocet 10 every 4 hours as needed.   # also has appt at [facility] in 2 days.   # Discussed Biopsy will be needed to confirm the diagnosis; once diagnosis available we will go over the plan. Encourage the patient to take copies of the images/CD to appt at [facility].  # Patient follow-up with me in approximately 1 week to review the above workup is sooner based upon the results.  Thank you Dr. Reita Cliche for allowing me to participate in the care of your pleasant patient. Please do not hesitate to contact me with questions or concerns in the interim.

## 2016-03-15 ENCOUNTER — Telehealth: Payer: Self-pay | Admitting: Internal Medicine

## 2016-03-15 ENCOUNTER — Encounter: Payer: Self-pay | Admitting: *Deleted

## 2016-03-15 ENCOUNTER — Ambulatory Visit
Admission: RE | Admit: 2016-03-15 | Discharge: 2016-03-15 | Disposition: A | Discharge status: Home / Self Care | Payer: BLUE CROSS/BLUE SHIELD | Source: Ambulatory Visit | Attending: Internal Medicine | Admitting: Internal Medicine

## 2016-03-15 ENCOUNTER — Other Ambulatory Visit: Payer: Self-pay | Admitting: Internal Medicine

## 2016-03-15 DIAGNOSIS — C801 Malignant (primary) neoplasm, unspecified: Secondary | ICD-10-CM

## 2016-03-15 DIAGNOSIS — I700 Atherosclerosis of aorta: Secondary | ICD-10-CM | POA: Insufficient documentation

## 2016-03-15 DIAGNOSIS — I2510 Atherosclerotic heart disease of native coronary artery without angina pectoris: Secondary | ICD-10-CM | POA: Diagnosis not present

## 2016-03-15 DIAGNOSIS — M899 Disorder of bone, unspecified: Secondary | ICD-10-CM | POA: Diagnosis not present

## 2016-03-15 DIAGNOSIS — R918 Other nonspecific abnormal finding of lung field: Secondary | ICD-10-CM | POA: Diagnosis not present

## 2016-03-15 DIAGNOSIS — R1909 Other intra-abdominal and pelvic swelling, mass and lump: Secondary | ICD-10-CM | POA: Insufficient documentation

## 2016-03-15 DIAGNOSIS — R590 Localized enlarged lymph nodes: Secondary | ICD-10-CM | POA: Diagnosis not present

## 2016-03-15 DIAGNOSIS — N888 Other specified noninflammatory disorders of cervix uteri: Secondary | ICD-10-CM

## 2016-03-15 DIAGNOSIS — K5730 Diverticulosis of large intestine without perforation or abscess without bleeding: Secondary | ICD-10-CM | POA: Insufficient documentation

## 2016-03-15 LAB — BETA HCG QUANT (REF LAB)

## 2016-03-15 LAB — PSA: PSA: 0.36 ng/mL (ref 0.00–4.00)

## 2016-03-15 LAB — AFP TUMOR MARKER: AFP TUMOR MARKER: 2.4 ng/mL (ref 0.0–8.3)

## 2016-03-15 LAB — CEA: CEA: 0.5 ng/mL (ref 0.0–4.7)

## 2016-03-15 LAB — CANCER ANTIGEN 19-9: CA 19-9: 16 U/mL (ref 0–35)

## 2016-03-15 MED ORDER — IOPAMIDOL (ISOVUE-370) INJECTION 76%
150.0000 mL | Freq: Once | INTRAVENOUS | Status: AC | PRN
  Administered 2016-03-15: 125 mL via INTRAVENOUS

## 2016-03-15 NOTE — Telephone Encounter (Signed)
Patient needs letter confirming he was here for an appointment yesterday (03/14/16) and today for scan and that he will need to be out of this week for his appts. Can you please write this letter for him? If you can do it and fax it to [facility] I might be able to get it to them before they leave from scan. Thank you!

## 2016-03-15 NOTE — Telephone Encounter (Signed)
Spoke to patient's wife/pt regarding the results of the CT scan- high suspicion for multiple myeloma.recommend to keep appt at [facility].  Recommend having blood work done in the morning/ to come to the [facility] in [place] at 8:30.   Heather- please make sure pt has labs drawn tomorrow morning.

## 2016-03-16 ENCOUNTER — Inpatient Hospital Stay: Payer: BLUE CROSS/BLUE SHIELD

## 2016-03-16 DIAGNOSIS — M129 Arthropathy, unspecified: Secondary | ICD-10-CM | POA: Diagnosis not present

## 2016-03-16 DIAGNOSIS — R221 Localized swelling, mass and lump, neck: Secondary | ICD-10-CM | POA: Diagnosis not present

## 2016-03-16 DIAGNOSIS — M4852XS Collapsed vertebra, not elsewhere classified, cervical region, sequela of fracture: Secondary | ICD-10-CM | POA: Diagnosis not present

## 2016-03-16 DIAGNOSIS — Z8051 Family history of malignant neoplasm of kidney: Secondary | ICD-10-CM | POA: Diagnosis not present

## 2016-03-16 DIAGNOSIS — C801 Malignant (primary) neoplasm, unspecified: Secondary | ICD-10-CM

## 2016-03-16 DIAGNOSIS — M542 Cervicalgia: Secondary | ICD-10-CM | POA: Diagnosis not present

## 2016-03-16 DIAGNOSIS — M899 Disorder of bone, unspecified: Secondary | ICD-10-CM | POA: Diagnosis not present

## 2016-03-16 DIAGNOSIS — Z79899 Other long term (current) drug therapy: Secondary | ICD-10-CM | POA: Diagnosis not present

## 2016-03-16 DIAGNOSIS — M50322 Other cervical disc degeneration at C5-C6 level: Secondary | ICD-10-CM | POA: Diagnosis not present

## 2016-03-16 LAB — COMPREHENSIVE METABOLIC PANEL
ALK PHOS: 65 U/L (ref 38–126)
ALT: 15 U/L — ABNORMAL LOW (ref 17–63)
ANION GAP: 6 (ref 5–15)
AST: 29 U/L (ref 15–41)
Albumin: 4.3 g/dL (ref 3.5–5.0)
BUN: 21 mg/dL — ABNORMAL HIGH (ref 6–20)
CALCIUM: 8.9 mg/dL (ref 8.9–10.3)
CO2: 26 mmol/L (ref 22–32)
Chloride: 103 mmol/L (ref 101–111)
Creatinine, Ser: 0.92 mg/dL (ref 0.61–1.24)
GFR calc non Af Amer: >60 mL/min (ref >60)
Glucose, Bld: 92 mg/dL (ref 65–99)
POTASSIUM: 3.6 mmol/L (ref 3.5–5.1)
SODIUM: 135 mmol/L (ref 135–145)
Total Bilirubin: 0.5 mg/dL (ref 0.3–1.2)
Total Protein: 9 g/dL — ABNORMAL HIGH (ref 6.5–8.1)

## 2016-03-16 LAB — CBC WITH DIFFERENTIAL/PLATELET
BASOS PCT: 0 %
Basophils Absolute: 0 10*3/uL (ref 0–0.1)
Eosinophils Absolute: 0 10*3/uL (ref 0–0.7)
Eosinophils Relative: 0 %
HEMATOCRIT: 35.6 % — AB (ref 40.0–52.0)
Hemoglobin: 12.7 g/dL — ABNORMAL LOW (ref 13.0–18.0)
Lymphocytes Relative: 24 %
Lymphs Abs: 1.2 10*3/uL (ref 1.0–3.6)
MCH: 33.3 pg (ref 26.0–34.0)
MCHC: 35.6 g/dL (ref 32.0–36.0)
MCV: 93.4 fL (ref 80.0–100.0)
MONO ABS: 0.5 10*3/uL (ref 0.2–1.0)
MONOS PCT: 10 %
NEUTROS ABS: 3.5 10*3/uL (ref 1.4–6.5)
Neutrophils Relative %: 66 %
PLATELETS: 183 10*3/uL (ref 150–440)
RBC: 3.81 MIL/uL — ABNORMAL LOW (ref 4.40–5.90)
RDW: 15 % — AB (ref 11.5–14.5)
WBC: 5.2 10*3/uL (ref 3.8–10.6)

## 2016-03-16 NOTE — Telephone Encounter (Signed)
msg sent to sch. Team to add lab encounter for today.

## 2016-03-17 ENCOUNTER — Other Ambulatory Visit: Payer: Self-pay | Admitting: Podiatry

## 2016-03-17 LAB — MULTIPLE MYELOMA PANEL, SERUM
ALBUMIN/GLOB SERPL: 0.9 (ref 0.7–1.7)
ALPHA 1: 0.2 g/dL (ref 0.0–0.4)
ALPHA2 GLOB SERPL ELPH-MCNC: 0.8 g/dL (ref 0.4–1.0)
Albumin SerPl Elph-Mcnc: 3.8 g/dL (ref 2.9–4.4)
B-GLOBULIN SERPL ELPH-MCNC: 1 g/dL (ref 0.7–1.3)
GAMMA GLOB SERPL ELPH-MCNC: 2.4 g/dL — AB (ref 0.4–1.8)
GLOBULIN, TOTAL: 4.5 g/dL — AB (ref 2.2–3.9)
IGG (IMMUNOGLOBIN G), SERUM: 2884 mg/dL — AB (ref 700–1600)
IgA: 135 mg/dL (ref 90–386)
IgM, Serum: 28 mg/dL (ref 20–172)
M PROTEIN SERPL ELPH-MCNC: 2.1 g/dL — AB
Total Protein ELP: 8.3 g/dL (ref 6.0–8.5)

## 2016-03-17 LAB — BETA 2 MICROGLOBULIN, SERUM: Beta-2 Microglobulin: 2.1 mg/L (ref 0.6–2.4)

## 2016-03-17 LAB — KAPPA/LAMBDA LIGHT CHAINS
KAPPA FREE LGHT CHN: 207.6 mg/L — AB (ref 3.3–19.4)
Kappa, lambda light chain ratio: 26.96 — ABNORMAL HIGH (ref 0.26–1.65)
Lambda free light chains: 7.7 mg/L (ref 5.7–26.3)

## 2016-03-21 ENCOUNTER — Ambulatory Visit (INDEPENDENT_AMBULATORY_CARE_PROVIDER_SITE_OTHER): Payer: BLUE CROSS/BLUE SHIELD | Admitting: Podiatry

## 2016-03-21 ENCOUNTER — Encounter: Payer: Self-pay | Admitting: Podiatry

## 2016-03-21 ENCOUNTER — Inpatient Hospital Stay (HOSPITAL_BASED_OUTPATIENT_CLINIC_OR_DEPARTMENT_OTHER): Payer: BLUE CROSS/BLUE SHIELD | Admitting: Internal Medicine

## 2016-03-21 VITALS — BP 144/90 | HR 69 | Temp 97.3°F | Resp 18 | Wt 289.7 lb

## 2016-03-21 DIAGNOSIS — M4852XS Collapsed vertebra, not elsewhere classified, cervical region, sequela of fracture: Secondary | ICD-10-CM

## 2016-03-21 DIAGNOSIS — M542 Cervicalgia: Secondary | ICD-10-CM

## 2016-03-21 DIAGNOSIS — B351 Tinea unguium: Secondary | ICD-10-CM | POA: Diagnosis not present

## 2016-03-21 DIAGNOSIS — R221 Localized swelling, mass and lump, neck: Secondary | ICD-10-CM | POA: Diagnosis not present

## 2016-03-21 DIAGNOSIS — M129 Arthropathy, unspecified: Secondary | ICD-10-CM

## 2016-03-21 DIAGNOSIS — Z8051 Family history of malignant neoplasm of kidney: Secondary | ICD-10-CM

## 2016-03-21 DIAGNOSIS — C9000 Multiple myeloma not having achieved remission: Secondary | ICD-10-CM | POA: Insufficient documentation

## 2016-03-21 DIAGNOSIS — Z79899 Other long term (current) drug therapy: Secondary | ICD-10-CM

## 2016-03-21 DIAGNOSIS — M50322 Other cervical disc degeneration at C5-C6 level: Secondary | ICD-10-CM

## 2016-03-21 MED ORDER — ONDANSETRON HCL 8 MG PO TABS: 8.0000 mg | ORAL_TABLET | Freq: Two times a day (BID) | ORAL | 1 refills | Status: DC | PRN

## 2016-03-21 MED ORDER — OXYCODONE-ACETAMINOPHEN 10-325 MG PO TABS: 1.0000 | ORAL_TABLET | Freq: Four times a day (QID) | ORAL | 0 refills | Status: DC | PRN

## 2016-03-21 MED ORDER — LENALIDOMIDE 25 MG PO CAPS: ORAL_CAPSULE | ORAL | 3 refills | Status: DC

## 2016-03-21 MED ORDER — PROCHLORPERAZINE MALEATE 10 MG PO TABS: 10.0000 mg | ORAL_TABLET | Freq: Four times a day (QID) | ORAL | 1 refills | Status: DC | PRN

## 2016-03-21 MED ORDER — OXYCODONE-ACETAMINOPHEN 10-325 MG PO TABS: 1.0000 | ORAL_TABLET | ORAL | 0 refills | Status: DC | PRN

## 2016-03-21 MED ORDER — ACYCLOVIR 400 MG PO TABS: 400.0000 mg | ORAL_TABLET | Freq: Two times a day (BID) | ORAL | 3 refills | Status: DC

## 2016-03-21 MED ORDER — DEXAMETHASONE 4 MG PO TABS: ORAL_TABLET | ORAL | 3 refills | Status: DC

## 2016-03-21 MED ORDER — DEXAMETHASONE 4 MG PO TABS: 4.0000 mg | ORAL_TABLET | Freq: Three times a day (TID) | ORAL | 0 refills | Status: DC

## 2016-03-21 NOTE — Progress Notes (Signed)
Patient states he has intermittent right shoulder pain when he tries to get up from a sitting position. (2/10) - Noticed this over the past week.

## 2016-03-21 NOTE — Assessment & Plan Note (Addendum)
Multiple myeloma- based upon positive monoclonal protein- IgG kappa; abnormal Kappa lambda light chain ratio; with the presence of multiple lytic lesions in the bone as on the CT scan. Recommend a bone marrow biopsy/plan at [facility]- for cytogenetics/tissue confirmation.  # Long discussion with patient and his wife this is incurable- however we will treated/ and would expect a life expectancy in many years. Also discussed multiple treatment options available for multiple myeloma.  # Patient is a transplant candidate; given the absence of significant comorbidities. I would recommend RVD chemotherapy regimen for the patient. Discussed the potential side effects. Recommend starting in the next 7-10 days. I would recommend evaluation at [facility] for myeloma transplant.   # Neck pain/neck mass- I will speak to [facility] regarding his recent neurosurgical evaluation. Given the absence of any Organ dysfunction/crisis- recommend surgical option for his neck mass. Steroids prescription refill. Continue Percocet.   # The above plan of care was discussed with the patient and his wife in detail.   # 40 minutes face-to-face with the patient discussing the above plan of care; more than 50% of time spent on prognosis/ natural history; counseling and coordination.  # Follow-up in one week with labs to discuss initiation of above treatment.

## 2016-03-21 NOTE — Progress Notes (Signed)
[place] NOTE  Patient Care Team: Arlis Porta., MD as PCP - General (Family Medicine)  CHIEF COMPLAINTS/PURPOSE OF CONSULTATION: Neck mass  #  Oncology History   # July- AUG 2017- MULTIPLE MYELOMA [place]- M- 2.1gm; K:L=207/7=29]; Beta-2 micro-2.1 STAGE I- BMBx-pending  # MULTIPLE LYTIC BONE LESIONS; C3 path Fx/soft tissue mass [eval at [facility]]     Multiple myeloma without remission ([place])   03/21/2016 Initial Diagnosis    Multiple myeloma without remission ([place])       HISTORY OF PRESENTING ILLNESS:  Kyle Parker 60 y.o.  male neck pain /MRI of the neck that showed pathologic compression fracture of C3/soft tissue mass- is here to review the results of the blood work/ordered for multiple lytic lesions.  Patient in the interim had a evaluation with neurosurgery at [facility]. He is awaiting further workup including a bone marrow biopsy is planned for August 17.  Patient noted to have significant improvement of his neck pain since being on steroids- dexamethasone 4 mg 3 times a day. He still needing to take Percocet as needed. Patient continues to wear his neck brace.  Patient denies any weight loss. Denies any unusual shortness of breath. Denies any chest pain. Denies any cough.  ROS: A complete 10 point review of system is done which is negative except mentioned above in history of present illness  MEDICAL HISTORY:  Past Medical History:  Diagnosis Date  . Arthritis     SURGICAL HISTORY: No past surgical history on file.  SOCIAL HISTORY: lives in [place]; shows tobacco smoking. No alcohol. Lives with his wife at home. Works for [employer]  . Marital status: Married    Spouse name: N/A  . Number of children: N/A  . Years of education: N/A   Occupational History  . Not on file.   Social History Main Topics  . Smoking status: Never Smoker  . Smokeless tobacco: Current User    Types: Chew  . Alcohol  use No  . Drug use: Unknown  . Sexual activity: Not on file   Other Topics Concern  . Not on file   Social History Narrative  . No narrative on file    FAMILY HISTORY: No family history on file. Father had kidney cancer.  ALLERGIES:  has No Known Allergies.  MEDICATIONS:  Current Outpatient Prescriptions  Medication Sig Dispense Refill  . cyclobenzaprine (FLEXERIL) 10 MG tablet Take 1 tablet (10 mg total) by mouth at bedtime. 30 tablet 0  . HYDROcodone-acetaminophen (NORCO/VICODIN) 5-325 MG tablet Take 1-2 tablets by mouth every 6 (six) hours as needed for moderate pain. 15 tablet 0  . meloxicam (MOBIC) 15 MG tablet Take 1 tablet (15 mg total) by mouth daily. 30 tablet 3  . metaxalone (SKELAXIN) 800 MG tablet Take 1 tablet (800 mg total) by mouth 3 (three) times daily. For muscle spasm 30 tablet 3  . oxyCODONE-acetaminophen (PERCOCET) 10-325 MG tablet Take 1 tablet by mouth every 4 (four) hours as needed for pain. 60 tablet 0  . oxyCODONE-acetaminophen (PERCOCET) 10-325 MG tablet Take 1 tablet by mouth every 6 (six) hours as needed for pain. 60 tablet 0  . pseudoephedrine (SUDAFED) 30 MG tablet Take 1 tablet (30 mg total) by mouth every 8 (eight) hours as needed for congestion. 20 tablet 0  . terbinafine (LAMISIL) 250 MG tablet Take 1 tablet (250 mg total) by mouth daily. 60 tablet 0  . acyclovir (ZOVIRAX)  400 MG tablet Take 1 tablet (400 mg total) by mouth 2 (two) times daily. 60 tablet 3  . dexamethasone (DECADRON) 4 MG tablet Take 1 tablet (4 mg total) by mouth 3 (three) times daily. Take it with food. 30 tablet 0  . dexamethasone (DECADRON) 4 MG tablet Take 10 tablets (40 mg) on days 1, 8, and 15 of chemo. Repeat every 21 days. 30 tablet 3  . lenalidomide (REVLIMID) 25 MG capsule Take one capsule daily on days 1-14 every 21 days. 14 capsule 3  . ondansetron (ZOFRAN) 8 MG tablet Take 1 tablet (8 mg total) by mouth 2 (two) times daily as needed (Nausea or vomiting). 30 tablet 1  .  prochlorperazine (COMPAZINE) 10 MG tablet Take 1 tablet (10 mg total) by mouth every 6 (six) hours as needed (Nausea or vomiting). 30 tablet 1   No current facility-administered medications for this visit.       Marland Kitchen  PHYSICAL EXAMINATION: ECOG PERFORMANCE STATUS: 1 - Symptomatic but completely ambulatory  Vitals:   03/21/16 0839  BP: (!) 144/90  Pulse: 69  Resp: 18  Temp: 97.3 F (36.3 C)   Filed Weights   03/21/16 0839  Weight: 289 lb 11 oz (131.4 kg)    GENERAL: Well-nourished well-developed; Alert, no distress and comfortable.  He is accompanied by his wife. EYES: no pallor or icterus OROPHARYNX: no thrush or ulceration; good dentition  NECK: supple, no masses felt; in neck brace. LYMPH:  no palpable lymphadenopathy in the cervical, axillary or inguinal regions LUNGS: clear to auscultation and  No wheeze or crackles HEART/CVS: regular rate & rhythm and no murmurs; No lower extremity edema ABDOMEN: abdomen soft, non-tender and normal bowel sounds Musculoskeletal:no cyanosis of digits and no clubbing  PSYCH: alert & oriented x 3 with fluent speech NEURO: no focal motor/sensory deficits SKIN:  no rashes or significant lesions  LABORATORY DATA:  I have reviewed the data as listed Lab Results  Component Value Date   WBC 5.2 03/16/2016   HGB 12.7 (L) 03/16/2016   HCT 35.6 (L) 03/16/2016   MCV 93.4 03/16/2016   PLT 183 03/16/2016    Recent Labs  12/10/15 1359 02/07/16 0950 03/12/16 1454 03/16/16 0838  NA  --   --  137 135  K  --   --  4.0 3.6  CL  --   --  109 103  CO2  --   --  22 26  GLUCOSE  --   --  123* 92  BUN  --   --  14 21*  CREATININE  --   --  0.96 0.92  CALCIUM  --   --  9.3 8.9  GFRNONAA  --   --  >60 >60  GFRAA  --   --  >60 >60  PROT 7.3 7.7  --  9.0*  ALBUMIN 4.3 4.1  --  4.3  AST 14 21  --  29  ALT 14 16  --  15*  ALKPHOS 76 73  --  65  BILITOT 0.4 0.3  --  0.5  BILIDIR 0.12 0.08  --   --     RADIOGRAPHIC STUDIES: I have personally  reviewed the radiological images as listed and agreed with the findings in the report. Dg Cervical Spine Complete  Result Date: 03/12/2016 CLINICAL DATA:  Patient with neck tightness and pain with neck extension. Initial encounter. No known injury. EXAM: CERVICAL SPINE - COMPLETE 4+ VIEW COMPARISON:  None. FINDINGS: There is lucency  and flattening of the C3 vertebral body. This is age indeterminate. Straightening of the normal cervical lordosis. Degenerative disc disease most pronounced C5-6. Lung apices are clear. Lateral masses articulate appropriately with the dens. IMPRESSION: Age-indeterminate lucency and flattening of the C3 vertebral body which may potentially be chronic in etiology however lesion with associated pathologic fracture is a consideration. This needs dedicated evaluation with contrast-enhanced cervical spine MRI. These results were called by telephone at the time of interpretation on 03/12/2016 at 2:43 pm to Dr. Marcene Brawn, who verbally acknowledged these results. Electronically Signed   By: Lovey Newcomer M.D.   On: 03/12/2016 14:44   Ct Chest W Contrast  Result Date: 03/15/2016 CLINICAL DATA:  60 year old male with recent diagnosis of pathologic C3 vertebral body fracture and additional suspicious bone lesions in the C5 vertebral body and clivus on recent cervical spine MRI. Metastatic / malignant bone lesions are suspected. Patient presents for staging evaluation. EXAM: CT CHEST, ABDOMEN, AND PELVIS WITH CONTRAST TECHNIQUE: Multidetector CT imaging of the chest, abdomen and pelvis was performed following the standard protocol during bolus administration of intravenous contrast. CONTRAST:  125 cc Isovue 370 IV. COMPARISON:  None. FINDINGS: CT CHEST FINDINGS Mediastinum/Nodes: Normal heart size. No significant pericardial fluid/thickening. Left anterior descending, left circumflex and right coronary atherosclerosis. Great vessels are normal in course and caliber. No central pulmonary emboli. No  discrete thyroid nodules. Unremarkable esophagus. No pathologically enlarged axillary, mediastinal or hilar lymph nodes. Lungs/Pleura: No pneumothorax. No pleural effusion. No acute consolidative airspace disease lung masses. There are tiny 3 mm subpleural pulmonary nodules in the basilar right upper lobe (series 5/image 78) and peripheral left lower lobe (series 5/ image 111). No additional significant pulmonary nodules. Musculoskeletal: There are innumerable small scattered lytic bone lesions throughout the thoracic vertebral bodies (largest at T10 on series 603/image 88), sternum, bilateral ribs and bilateral shoulder girdles. No appreciable extraosseous soft tissue components to the lytic bone lesions. CT ABDOMEN PELVIS FINDINGS Hepatobiliary: Normal liver with no liver mass. Normal gallbladder with no radiopaque cholelithiasis. No biliary ductal dilatation. Pancreas: Normal, with no mass or duct dilation. Spleen: Normal size. No mass. Adrenals/Urinary Tract: Normal adrenals. No hydronephrosis. Simple renal cysts are present in both kidneys measuring up to 8.7 cm in the lower right kidney and 6.7 cm in the posterior interpolar left kidney. Additional too small to characterize subcentimeter hypodense renal cortical lesions throughout both kidneys, which do not require further follow-up. Normal bladder. Stomach/Bowel: Grossly normal stomach. Normal caliber small bowel with no small bowel wall thickening. Normal appendix. Mild sigmoid diverticulosis, with no large bowel wall thickening or pericolonic fat stranding. Vascular/Lymphatic: Mildly atherosclerotic nonaneurysmal abdominal aorta. Patent portal, splenic, hepatic and renal veins. No pathologically enlarged lymph nodes in the abdomen. Mildly enlarged 1.1 cm left external iliac node (series 2/image 121). No additional pathologically enlarged pelvic nodes. Reproductive: Normal size prostate. Other: No pneumoperitoneum, ascites or focal fluid collection.  Musculoskeletal: There are several scattered lytic bone lesions throughout the lumbar vertebral bodies, largest in the anterior L1 vertebral body (series 603/ image 87). Tiny lytic bone lesions are seen scattered in the sacrum, bilateral iliac bones and proximal femora. IMPRESSION: 1. Innumerable scattered small lytic bone lesions throughout the axial and proximal appendicular skeleton. No primary neoplasm is evident in the chest, abdomen or pelvis. Considerations include multiple myeloma or lytic osseous metastases from an occult primary. 2. Solitary mildly enlarged left external iliac lymph node. No additional sites of lymphadenopathy. 3. Two tiny 3 mm subpleural pulmonary nodules,  probably benign. Consider attention on follow-up chest CT in 3-6 months depending on the results of the work-up for the bone lesions. 4. Aortic atherosclerosis.  Three-vessel coronary atherosclerosis. 5. Mild sigmoid diverticulosis. Electronically Signed   By: Ilona Sorrel M.D.   On: 03/15/2016 13:08   Mr Cervical Spine W Wo Contrast  Result Date: 03/12/2016 CLINICAL DATA:  Neck pain. Abnormal radiography with concern for pathologic lesion. EXAM: MRI CERVICAL SPINE WITHOUT AND WITH CONTRAST TECHNIQUE: Multiplanar and multiecho pulse sequences of the cervical spine, to include the craniocervical junction and cervicothoracic junction, were obtained without and with intravenous contrast. CONTRAST:  45m MULTIHANCE GADOBENATE DIMEGLUMINE 529 MG/ML IV SOLN COMPARISON:  Radiography from earlier today FINDINGS: Alignment: Physiologic. Vertebrae: The marrow is diffusely abnormally hypointense on T1 weighted imaging. Discrete enhancing lesions also seen in the C3 body, C5 posterior body, clivus,and right T1 articular process. Pathologic fracture at the C3 level with rightward extraosseous tumor infiltrating the inferior right C3-4 foramen and prevertebral musculature. The right vertebral artery is displaced but patent. There is posterior  bowing of the cortex at this level which contacts the ventral cord without compression. Maximal height loss at C3 is up to 50%. Cord: Normal signal and morphology. No abnormal intrathecal enhancement. Posterior Fossa, vertebral arteries, paraspinal tissues: Negative for adenopathy. Tumoral mass effect on the right vertebral artery without evidence of narrowing. Disc levels: C6-7 degenerative disc narrowing and bulging with mild bilateral foraminal stenosis. IMPRESSION: Cervical and clival lesions consistent with malignant/metastatic process. Large C3 body deposit with pathologic fracture and C3-4 right foraminal infiltration. Posterior bowing of the C3 body contacts the cord without mass effect. Electronically Signed   By: JMonte FantasiaM.D.   On: 03/12/2016 17:47   Ct Abdomen Pelvis W Contrast  Result Date: 03/15/2016 CLINICAL DATA:  60year old male with recent diagnosis of pathologic C3 vertebral body fracture and additional suspicious bone lesions in the C5 vertebral body and clivus on recent cervical spine MRI. Metastatic / malignant bone lesions are suspected. Patient presents for staging evaluation. EXAM: CT CHEST, ABDOMEN, AND PELVIS WITH CONTRAST TECHNIQUE: Multidetector CT imaging of the chest, abdomen and pelvis was performed following the standard protocol during bolus administration of intravenous contrast. CONTRAST:  125 cc Isovue 370 IV. COMPARISON:  None. FINDINGS: CT CHEST FINDINGS Mediastinum/Nodes: Normal heart size. No significant pericardial fluid/thickening. Left anterior descending, left circumflex and right coronary atherosclerosis. Great vessels are normal in course and caliber. No central pulmonary emboli. No discrete thyroid nodules. Unremarkable esophagus. No pathologically enlarged axillary, mediastinal or hilar lymph nodes. Lungs/Pleura: No pneumothorax. No pleural effusion. No acute consolidative airspace disease lung masses. There are tiny 3 mm subpleural pulmonary nodules in the  basilar right upper lobe (series 5/image 78) and peripheral left lower lobe (series 5/ image 111). No additional significant pulmonary nodules. Musculoskeletal: There are innumerable small scattered lytic bone lesions throughout the thoracic vertebral bodies (largest at T10 on series 603/image 88), sternum, bilateral ribs and bilateral shoulder girdles. No appreciable extraosseous soft tissue components to the lytic bone lesions. CT ABDOMEN PELVIS FINDINGS Hepatobiliary: Normal liver with no liver mass. Normal gallbladder with no radiopaque cholelithiasis. No biliary ductal dilatation. Pancreas: Normal, with no mass or duct dilation. Spleen: Normal size. No mass. Adrenals/Urinary Tract: Normal adrenals. No hydronephrosis. Simple renal cysts are present in both kidneys measuring up to 8.7 cm in the lower right kidney and 6.7 cm in the posterior interpolar left kidney. Additional too small to characterize subcentimeter hypodense renal cortical lesions throughout both  kidneys, which do not require further follow-up. Normal bladder. Stomach/Bowel: Grossly normal stomach. Normal caliber small bowel with no small bowel wall thickening. Normal appendix. Mild sigmoid diverticulosis, with no large bowel wall thickening or pericolonic fat stranding. Vascular/Lymphatic: Mildly atherosclerotic nonaneurysmal abdominal aorta. Patent portal, splenic, hepatic and renal veins. No pathologically enlarged lymph nodes in the abdomen. Mildly enlarged 1.1 cm left external iliac node (series 2/image 121). No additional pathologically enlarged pelvic nodes. Reproductive: Normal size prostate. Other: No pneumoperitoneum, ascites or focal fluid collection. Musculoskeletal: There are several scattered lytic bone lesions throughout the lumbar vertebral bodies, largest in the anterior L1 vertebral body (series 603/ image 87). Tiny lytic bone lesions are seen scattered in the sacrum, bilateral iliac bones and proximal femora. IMPRESSION: 1.  Innumerable scattered small lytic bone lesions throughout the axial and proximal appendicular skeleton. No primary neoplasm is evident in the chest, abdomen or pelvis. Considerations include multiple myeloma or lytic osseous metastases from an occult primary. 2. Solitary mildly enlarged left external iliac lymph node. No additional sites of lymphadenopathy. 3. Two tiny 3 mm subpleural pulmonary nodules, probably benign. Consider attention on follow-up chest CT in 3-6 months depending on the results of the work-up for the bone lesions. 4. Aortic atherosclerosis.  Three-vessel coronary atherosclerosis. 5. Mild sigmoid diverticulosis. Electronically Signed   By: Ilona Sorrel M.D.   On: 03/15/2016 13:08    ASSESSMENT & PLAN:   Multiple myeloma without remission ([facility]) Multiple myeloma- based upon positive monoclonal protein- IgG kappa; abnormal Kappa lambda light chain ratio; with the presence of multiple lytic lesions in the bone as on the CT scan. Recommend a bone marrow biopsy/plan at [facility]- for cytogenetics/tissue confirmation.  # Long discussion with patient and his wife this is incurable- however we will treated/ and would expect a life expectancy in many years. Also discussed multiple treatment options available for multiple myeloma.  # Patient is a transplant candidate; given the absence of significant comorbidities. I would recommend RVD chemotherapy regimen for the patient. Discussed the potential side effects. Recommend starting in the next 7-10 days. I would recommend evaluation at [facility] for myeloma transplant.   # Neck pain/neck mass- I will speak to [facility] regarding his recent neurosurgical evaluation. Given the absence of any Organ dysfunction/crisis- recommend surgical option for his neck mass. Steroids prescription refill. Continue Percocet.   # The above plan of care was discussed with the patient and his wife in detail.   # 40 minutes face-to-face with the patient discussing the above plan  of care; more than 50% of time spent on prognosis/ natural history; counseling and coordination.   All questions were answered. The patient knows to call the clinic with any problems, questions or concerns.     Cammie Sickle, MD 03/21/2016 7:08 PM

## 2016-03-22 ENCOUNTER — Telehealth: Payer: Self-pay | Admitting: *Deleted

## 2016-03-22 NOTE — Progress Notes (Signed)
Patient ID: Kyle Parker, male   DOB: Apr 24, 1956, 60 y.o.   MRN: [id number]  Subjective: 60 year old male presents the office today after completing Lamisil. He states his nails are significantly better. He still some discoloration to the end of the toenails but they're much better. Denies any drainage or redness or swelling. He is also continue with Fungi-Nail. Last appointment he has been diagnosed with multiple myeloma.  Objective: AAO x3, NAD DP/PT pulses palpable bilaterally, CRT less than 3 seconds Bilateral hallux nails appear to be significantly improved. There is slight discoloration and dystrophy to the distal aspect of the toenail but evidence of clearing proximally. No edema, erythema, drainage or any signs of infection. No open lesions or pre-ulcerative lesions. No edema, erythema, increase in warmth to bilateral lower extremities.  No open lesions or pre-ulcerative lesions.  No pain with calf compression, swelling, warmth, erythema  Assessment: Onychomycosis bilateral hallux, resolving   Plan: -All treatment options discussed with the patient including all alternatives, risks, complications.  -At this time continue with topical treatment. Continue with the toenails corral. The nails by the hallux were debrided without complications or bleeding. Follow up in a couple months if there is not any improvement or if there is any reoccurrence.  Celesta Gentile, DPM

## 2016-03-22 NOTE — Telephone Encounter (Signed)
Called patient and LVM that FMLA paperwork is ready to be picked up.

## 2016-03-23 ENCOUNTER — Telehealth: Payer: Self-pay | Admitting: Internal Medicine

## 2016-03-23 DIAGNOSIS — M899 Disorder of bone, unspecified: Secondary | ICD-10-CM | POA: Diagnosis not present

## 2016-03-23 DIAGNOSIS — C9030 Solitary plasmacytoma not having achieved remission: Secondary | ICD-10-CM | POA: Diagnosis not present

## 2016-03-23 DIAGNOSIS — C801 Malignant (primary) neoplasm, unspecified: Secondary | ICD-10-CM | POA: Diagnosis not present

## 2016-03-23 HISTORY — PX: OTHER SURGICAL HISTORY: SHX169

## 2016-03-23 NOTE — Telephone Encounter (Signed)
Spoke to [place] - neurosurgery at [facility]; await Bmbx planned on 8/17 at [facility]; will be in touch re: next plan of care. If Multiple myeloma plan from NSx would be screws/ to support the neck/ plan RT; and proceed with subsequent systemic therapy. Will also inform Dr.Chrystal from radiation oncology.

## 2016-03-28 ENCOUNTER — Other Ambulatory Visit: Payer: Self-pay

## 2016-03-28 ENCOUNTER — Inpatient Hospital Stay: Payer: BLUE CROSS/BLUE SHIELD

## 2016-03-28 ENCOUNTER — Inpatient Hospital Stay (HOSPITAL_BASED_OUTPATIENT_CLINIC_OR_DEPARTMENT_OTHER): Payer: BLUE CROSS/BLUE SHIELD | Admitting: Internal Medicine

## 2016-03-28 ENCOUNTER — Other Ambulatory Visit: Payer: Self-pay | Admitting: Internal Medicine

## 2016-03-28 ENCOUNTER — Encounter: Payer: Self-pay | Admitting: Internal Medicine

## 2016-03-28 VITALS — BP 129/77 | HR 74 | Temp 97.9°F | Resp 17 | Ht 72.0 in | Wt 293.2 lb

## 2016-03-28 DIAGNOSIS — M542 Cervicalgia: Secondary | ICD-10-CM | POA: Diagnosis not present

## 2016-03-28 DIAGNOSIS — Z8051 Family history of malignant neoplasm of kidney: Secondary | ICD-10-CM

## 2016-03-28 DIAGNOSIS — M50322 Other cervical disc degeneration at C5-C6 level: Secondary | ICD-10-CM | POA: Diagnosis not present

## 2016-03-28 DIAGNOSIS — R221 Localized swelling, mass and lump, neck: Secondary | ICD-10-CM | POA: Diagnosis not present

## 2016-03-28 DIAGNOSIS — C9000 Multiple myeloma not having achieved remission: Secondary | ICD-10-CM

## 2016-03-28 DIAGNOSIS — Z79899 Other long term (current) drug therapy: Secondary | ICD-10-CM

## 2016-03-28 DIAGNOSIS — M129 Arthropathy, unspecified: Secondary | ICD-10-CM | POA: Diagnosis not present

## 2016-03-28 DIAGNOSIS — M4852XS Collapsed vertebra, not elsewhere classified, cervical region, sequela of fracture: Secondary | ICD-10-CM

## 2016-03-28 LAB — COMPREHENSIVE METABOLIC PANEL
ALT: 20 U/L (ref 17–63)
ANION GAP: 7 (ref 5–15)
AST: 13 U/L — ABNORMAL LOW (ref 15–41)
Albumin: 3.5 g/dL (ref 3.5–5.0)
Alkaline Phosphatase: 75 U/L (ref 38–126)
BUN: 25 mg/dL — ABNORMAL HIGH (ref 6–20)
CHLORIDE: 107 mmol/L (ref 101–111)
CO2: 24 mmol/L (ref 22–32)
Calcium: 8.6 mg/dL — ABNORMAL LOW (ref 8.9–10.3)
Creatinine, Ser: 0.79 mg/dL (ref 0.61–1.24)
GFR calc non Af Amer: >60 mL/min (ref >60)
Glucose, Bld: 99 mg/dL (ref 65–99)
Potassium: 4.3 mmol/L (ref 3.5–5.1)
SODIUM: 138 mmol/L (ref 135–145)
Total Bilirubin: 0.8 mg/dL (ref 0.3–1.2)
Total Protein: 6.5 g/dL (ref 6.5–8.1)

## 2016-03-28 LAB — CBC WITH DIFFERENTIAL/PLATELET
BASOS PCT: 0 %
Basophils Absolute: 0 10*3/uL (ref 0–0.1)
EOS ABS: 0 10*3/uL (ref 0–0.7)
Eosinophils Relative: 0 %
HCT: 42.3 % (ref 40.0–52.0)
HEMOGLOBIN: 14.4 g/dL (ref 13.0–18.0)
LYMPHS ABS: 1 10*3/uL (ref 1.0–3.6)
Lymphocytes Relative: 9 %
MCH: 33 pg (ref 26.0–34.0)
MCHC: 34.1 g/dL (ref 32.0–36.0)
MCV: 96.8 fL (ref 80.0–100.0)
MONO ABS: 1.2 10*3/uL — AB (ref 0.2–1.0)
MONOS PCT: 11 %
NEUTROS ABS: 9.2 10*3/uL — AB (ref 1.4–6.5)
Neutrophils Relative %: 80 %
PLATELETS: 237 10*3/uL (ref 150–440)
RBC: 4.37 MIL/uL — ABNORMAL LOW (ref 4.40–5.90)
RDW: 16.2 % — AB (ref 11.5–14.5)
WBC: 11.5 10*3/uL — ABNORMAL HIGH (ref 3.8–10.6)

## 2016-03-28 MED ORDER — OXYCODONE-ACETAMINOPHEN 10-325 MG PO TABS: 1.0000 | ORAL_TABLET | ORAL | 0 refills | Status: DC | PRN

## 2016-03-28 NOTE — Progress Notes (Signed)
[place] NOTE  Patient Care Team: Arlis Porta., MD as PCP - General (Family Medicine)  CHIEF COMPLAINTS/PURPOSE OF CONSULTATION: Neck mass  #  Oncology History   # July- AUG 2017- MULTIPLE MYELOMA Lucas- M- 2.1gm; K:L=207/7=29]; Beta-2 micro-2.1 STAGE I- BMBx-pending  # MULTIPLE LYTIC BONE LESIONS; C3 path Fx/soft tissue mass [eval at [facility]]     Multiple myeloma without remission ([place])   03/21/2016 Initial Diagnosis    Multiple myeloma without remission ([place])        HISTORY OF PRESENTING ILLNESS:  Kyle Parker 60 y.o.  male  most likely multiple myeloma involving the C3/compression fracture and multiple bone lesions is here for follow-up.  In the interim patient underwent a CT scan of the neck at [facility]; and also a biopsy of the vertebral body lesion-at [facility] awaiting pathology.  Patient noted to have significant improvement of his neck pain since being on steroids- dexamethasone 4 mg 3 times a day. He still needing to take Percocet as needed. Patient continues to wear his neck brace. He is also on muscle relaxant/and Mobic.  Patient denies any weight loss. Denies any unusual shortness of breath. Denies any chest pain. Denies any cough.  ROS: A complete 10 point review of system is done which is negative except mentioned above in history of present illness  MEDICAL HISTORY:  Past Medical History:  Diagnosis Date  . Arthritis     SURGICAL HISTORY: No past surgical history on file.  SOCIAL HISTORY: lives in [place]; shows tobacco smoking. No alcohol. Lives with his wife at home. Works for [employer]  . Marital status: Married    Spouse name: N/A  . Number of children: N/A  . Years of education: N/A   Occupational History  . Not on file.   Social History Main Topics  . Smoking status: Never Smoker  . Smokeless tobacco: Current User    Types: Chew  . Alcohol use No  . Drug use: No  . Sexual  activity: Not on file   Other Topics Concern  . Not on file   Social History Narrative  . No narrative on file    FAMILY HISTORY: No family history on file. Father had kidney cancer.  ALLERGIES:  has No Known Allergies.  MEDICATIONS:  Current Outpatient Prescriptions  Medication Sig Dispense Refill  . cyclobenzaprine (FLEXERIL) 10 MG tablet Take 1 tablet (10 mg total) by mouth at bedtime. 30 tablet 0  . meloxicam (MOBIC) 15 MG tablet Take 1 tablet (15 mg total) by mouth daily. 30 tablet 3  . metaxalone (SKELAXIN) 800 MG tablet Take 1 tablet (800 mg total) by mouth 3 (three) times daily. For muscle spasm 30 tablet 3  . acyclovir (ZOVIRAX) 400 MG tablet Take 1 tablet (400 mg total) by mouth 2 (two) times daily. (Patient not taking: Reported on 03/28/2016) 60 tablet 3  . dexamethasone (DECADRON) 4 MG tablet Take 1 tablet (4 mg total) by mouth 3 (three) times daily. Take it with food. 30 tablet 0  . dexamethasone (DECADRON) 4 MG tablet Take 10 tablets (40 mg) on days 1, 8, and 15 of chemo. Repeat every 21 days. 30 tablet 3  . HYDROcodone-acetaminophen (NORCO/VICODIN) 5-325 MG tablet Take 1-2 tablets by mouth every 6 (six) hours as needed for moderate pain. (Patient not taking: Reported on 03/28/2016) 15 tablet 0  . lenalidomide (REVLIMID) 25 MG capsule Take one capsule daily on days  1-14 every 21 days. (Patient not taking: Reported on 03/28/2016) 14 capsule 3  . ondansetron (ZOFRAN) 8 MG tablet Take 1 tablet (8 mg total) by mouth 2 (two) times daily as needed (Nausea or vomiting). (Patient not taking: Reported on 03/28/2016) 30 tablet 1  . oxyCODONE-acetaminophen (PERCOCET) 10-325 MG tablet Take 1 tablet by mouth every 4 (four) hours as needed for pain. 60 tablet 0  . prochlorperazine (COMPAZINE) 10 MG tablet Take 1 tablet (10 mg total) by mouth every 6 (six) hours as needed (Nausea or vomiting). (Patient not taking: Reported on 03/28/2016) 30 tablet 1   No current facility-administered  medications for this visit.       Marland Kitchen  PHYSICAL EXAMINATION: ECOG PERFORMANCE STATUS: 1 - Symptomatic but completely ambulatory  Vitals:   03/28/16 1445  BP: 129/77  Pulse: 74  Resp: 17  Temp: 97.9 F (36.6 C)   Filed Weights   03/28/16 1445  Weight: 293 lb 3.4 oz (133 kg)    GENERAL: Well-nourished well-developed; Alert, no distress and comfortable.  He is accompanied by his wife. EYES: no pallor or icterus OROPHARYNX: no thrush or ulceration; good dentition  NECK: supple, no masses felt; in neck brace. LYMPH:  no palpable lymphadenopathy in the cervical, axillary or inguinal regions LUNGS: clear to auscultation and  No wheeze or crackles HEART/CVS: regular rate & rhythm and no murmurs; No lower extremity edema ABDOMEN: abdomen soft, non-tender and normal bowel sounds Musculoskeletal:no cyanosis of digits and no clubbing  PSYCH: alert & oriented x 3 with fluent speech NEURO: no focal motor/sensory deficits SKIN:  no rashes or significant lesions  LABORATORY DATA:  I have reviewed the data as listed Lab Results  Component Value Date   WBC 11.5 (H) 03/28/2016   HGB 14.4 03/28/2016   HCT 42.3 03/28/2016   MCV 96.8 03/28/2016   PLT 237 03/28/2016    Recent Labs  12/10/15 1359 02/07/16 0950 03/12/16 1454 03/16/16 0838 03/28/16 1435  NA  --   --  137 135 138  K  --   --  4.0 3.6 4.3  CL  --   --  109 103 107  CO2  --   --  22 26 24   GLUCOSE  --   --  123* 92 99  BUN  --   --  14 21* 25*  CREATININE  --   --  0.96 0.92 0.79  CALCIUM  --   --  9.3 8.9 8.6*  GFRNONAA  --   --  >60 >60 >60  GFRAA  --   --  >60 >60 >60  PROT 7.3 7.7  --  9.0* 6.5  ALBUMIN 4.3 4.1  --  4.3 3.5  AST 14 21  --  29 13*  ALT 14 16  --  15* 20  ALKPHOS 76 73  --  65 75  BILITOT 0.4 0.3  --  0.5 0.8  BILIDIR 0.12 0.08  --   --   --     RADIOGRAPHIC STUDIES: I have personally reviewed the radiological images as listed and agreed with the findings in the report. Dg Cervical Spine  Complete  Result Date: 03/12/2016 CLINICAL DATA:  Patient with neck tightness and pain with neck extension. Initial encounter. No known injury. EXAM: CERVICAL SPINE - COMPLETE 4+ VIEW COMPARISON:  None. FINDINGS: There is lucency and flattening of the C3 vertebral body. This is age indeterminate. Straightening of the normal cervical lordosis. Degenerative disc disease most pronounced C5-6. Lung  apices are clear. Lateral masses articulate appropriately with the dens. IMPRESSION: Age-indeterminate lucency and flattening of the C3 vertebral body which may potentially be chronic in etiology however lesion with associated pathologic fracture is a consideration. This needs dedicated evaluation with contrast-enhanced cervical spine MRI. These results were called by telephone at the time of interpretation on 03/12/2016 at 2:43 pm to Dr. Marcene Brawn, who verbally acknowledged these results. Electronically Signed   By: Lovey Newcomer M.D.   On: 03/12/2016 14:44   Ct Chest W Contrast  Result Date: 03/15/2016 CLINICAL DATA:  60 year old male with recent diagnosis of pathologic C3 vertebral body fracture and additional suspicious bone lesions in the C5 vertebral body and clivus on recent cervical spine MRI. Metastatic / malignant bone lesions are suspected. Patient presents for staging evaluation. EXAM: CT CHEST, ABDOMEN, AND PELVIS WITH CONTRAST TECHNIQUE: Multidetector CT imaging of the chest, abdomen and pelvis was performed following the standard protocol during bolus administration of intravenous contrast. CONTRAST:  125 cc Isovue 370 IV. COMPARISON:  None. FINDINGS: CT CHEST FINDINGS Mediastinum/Nodes: Normal heart size. No significant pericardial fluid/thickening. Left anterior descending, left circumflex and right coronary atherosclerosis. Great vessels are normal in course and caliber. No central pulmonary emboli. No discrete thyroid nodules. Unremarkable esophagus. No pathologically enlarged axillary, mediastinal or hilar  lymph nodes. Lungs/Pleura: No pneumothorax. No pleural effusion. No acute consolidative airspace disease lung masses. There are tiny 3 mm subpleural pulmonary nodules in the basilar right upper lobe (series 5/image 78) and peripheral left lower lobe (series 5/ image 111). No additional significant pulmonary nodules. Musculoskeletal: There are innumerable small scattered lytic bone lesions throughout the thoracic vertebral bodies (largest at T10 on series 603/image 88), sternum, bilateral ribs and bilateral shoulder girdles. No appreciable extraosseous soft tissue components to the lytic bone lesions. CT ABDOMEN PELVIS FINDINGS Hepatobiliary: Normal liver with no liver mass. Normal gallbladder with no radiopaque cholelithiasis. No biliary ductal dilatation. Pancreas: Normal, with no mass or duct dilation. Spleen: Normal size. No mass. Adrenals/Urinary Tract: Normal adrenals. No hydronephrosis. Simple renal cysts are present in both kidneys measuring up to 8.7 cm in the lower right kidney and 6.7 cm in the posterior interpolar left kidney. Additional too small to characterize subcentimeter hypodense renal cortical lesions throughout both kidneys, which do not require further follow-up. Normal bladder. Stomach/Bowel: Grossly normal stomach. Normal caliber small bowel with no small bowel wall thickening. Normal appendix. Mild sigmoid diverticulosis, with no large bowel wall thickening or pericolonic fat stranding. Vascular/Lymphatic: Mildly atherosclerotic nonaneurysmal abdominal aorta. Patent portal, splenic, hepatic and renal veins. No pathologically enlarged lymph nodes in the abdomen. Mildly enlarged 1.1 cm left external iliac node (series 2/image 121). No additional pathologically enlarged pelvic nodes. Reproductive: Normal size prostate. Other: No pneumoperitoneum, ascites or focal fluid collection. Musculoskeletal: There are several scattered lytic bone lesions throughout the lumbar vertebral bodies, largest in  the anterior L1 vertebral body (series 603/ image 87). Tiny lytic bone lesions are seen scattered in the sacrum, bilateral iliac bones and proximal femora. IMPRESSION: 1. Innumerable scattered small lytic bone lesions throughout the axial and proximal appendicular skeleton. No primary neoplasm is evident in the chest, abdomen or pelvis. Considerations include multiple myeloma or lytic osseous metastases from an occult primary. 2. Solitary mildly enlarged left external iliac lymph node. No additional sites of lymphadenopathy. 3. Two tiny 3 mm subpleural pulmonary nodules, probably benign. Consider attention on follow-up chest CT in 3-6 months depending on the results of the work-up for the bone lesions. 4. Aortic  atherosclerosis.  Three-vessel coronary atherosclerosis. 5. Mild sigmoid diverticulosis. Electronically Signed   By: Ilona Sorrel M.D.   On: 03/15/2016 13:08   Mr Cervical Spine W Wo Contrast  Result Date: 03/12/2016 CLINICAL DATA:  Neck pain. Abnormal radiography with concern for pathologic lesion. EXAM: MRI CERVICAL SPINE WITHOUT AND WITH CONTRAST TECHNIQUE: Multiplanar and multiecho pulse sequences of the cervical spine, to include the craniocervical junction and cervicothoracic junction, were obtained without and with intravenous contrast. CONTRAST:  79m MULTIHANCE GADOBENATE DIMEGLUMINE 529 MG/ML IV SOLN COMPARISON:  Radiography from earlier today FINDINGS: Alignment: Physiologic. Vertebrae: The marrow is diffusely abnormally hypointense on T1 weighted imaging. Discrete enhancing lesions also seen in the C3 body, C5 posterior body, clivus,and right T1 articular process. Pathologic fracture at the C3 level with rightward extraosseous tumor infiltrating the inferior right C3-4 foramen and prevertebral musculature. The right vertebral artery is displaced but patent. There is posterior bowing of the cortex at this level which contacts the ventral cord without compression. Maximal height loss at C3 is up  to 50%. Cord: Normal signal and morphology. No abnormal intrathecal enhancement. Posterior Fossa, vertebral arteries, paraspinal tissues: Negative for adenopathy. Tumoral mass effect on the right vertebral artery without evidence of narrowing. Disc levels: C6-7 degenerative disc narrowing and bulging with mild bilateral foraminal stenosis. IMPRESSION: Cervical and clival lesions consistent with malignant/metastatic process. Large C3 body deposit with pathologic fracture and C3-4 right foraminal infiltration. Posterior bowing of the C3 body contacts the cord without mass effect. Electronically Signed   By: JMonte FantasiaM.D.   On: 03/12/2016 17:47   Ct Abdomen Pelvis W Contrast  Result Date: 03/15/2016 CLINICAL DATA:  60year old male with recent diagnosis of pathologic C3 vertebral body fracture and additional suspicious bone lesions in the C5 vertebral body and clivus on recent cervical spine MRI. Metastatic / malignant bone lesions are suspected. Patient presents for staging evaluation. EXAM: CT CHEST, ABDOMEN, AND PELVIS WITH CONTRAST TECHNIQUE: Multidetector CT imaging of the chest, abdomen and pelvis was performed following the standard protocol during bolus administration of intravenous contrast. CONTRAST:  125 cc Isovue 370 IV. COMPARISON:  None. FINDINGS: CT CHEST FINDINGS Mediastinum/Nodes: Normal heart size. No significant pericardial fluid/thickening. Left anterior descending, left circumflex and right coronary atherosclerosis. Great vessels are normal in course and caliber. No central pulmonary emboli. No discrete thyroid nodules. Unremarkable esophagus. No pathologically enlarged axillary, mediastinal or hilar lymph nodes. Lungs/Pleura: No pneumothorax. No pleural effusion. No acute consolidative airspace disease lung masses. There are tiny 3 mm subpleural pulmonary nodules in the basilar right upper lobe (series 5/image 78) and peripheral left lower lobe (series 5/ image 111). No additional  significant pulmonary nodules. Musculoskeletal: There are innumerable small scattered lytic bone lesions throughout the thoracic vertebral bodies (largest at T10 on series 603/image 88), sternum, bilateral ribs and bilateral shoulder girdles. No appreciable extraosseous soft tissue components to the lytic bone lesions. CT ABDOMEN PELVIS FINDINGS Hepatobiliary: Normal liver with no liver mass. Normal gallbladder with no radiopaque cholelithiasis. No biliary ductal dilatation. Pancreas: Normal, with no mass or duct dilation. Spleen: Normal size. No mass. Adrenals/Urinary Tract: Normal adrenals. No hydronephrosis. Simple renal cysts are present in both kidneys measuring up to 8.7 cm in the lower right kidney and 6.7 cm in the posterior interpolar left kidney. Additional too small to characterize subcentimeter hypodense renal cortical lesions throughout both kidneys, which do not require further follow-up. Normal bladder. Stomach/Bowel: Grossly normal stomach. Normal caliber small bowel with no small bowel wall thickening. Normal  appendix. Mild sigmoid diverticulosis, with no large bowel wall thickening or pericolonic fat stranding. Vascular/Lymphatic: Mildly atherosclerotic nonaneurysmal abdominal aorta. Patent portal, splenic, hepatic and renal veins. No pathologically enlarged lymph nodes in the abdomen. Mildly enlarged 1.1 cm left external iliac node (series 2/image 121). No additional pathologically enlarged pelvic nodes. Reproductive: Normal size prostate. Other: No pneumoperitoneum, ascites or focal fluid collection. Musculoskeletal: There are several scattered lytic bone lesions throughout the lumbar vertebral bodies, largest in the anterior L1 vertebral body (series 603/ image 87). Tiny lytic bone lesions are seen scattered in the sacrum, bilateral iliac bones and proximal femora. IMPRESSION: 1. Innumerable scattered small lytic bone lesions throughout the axial and proximal appendicular skeleton. No primary  neoplasm is evident in the chest, abdomen or pelvis. Considerations include multiple myeloma or lytic osseous metastases from an occult primary. 2. Solitary mildly enlarged left external iliac lymph node. No additional sites of lymphadenopathy. 3. Two tiny 3 mm subpleural pulmonary nodules, probably benign. Consider attention on follow-up chest CT in 3-6 months depending on the results of the work-up for the bone lesions. 4. Aortic atherosclerosis.  Three-vessel coronary atherosclerosis. 5. Mild sigmoid diverticulosis. Electronically Signed   By: Ilona Sorrel M.D.   On: 03/15/2016 13:08    ASSESSMENT & PLAN:   Multiple myeloma without remission ([facility]) Multiple myeloma- monoclonal protein- IgG kappa; abnormal Kappa lambda light chain ratio; with the presence of multiple lytic lesions in the bone as on the CT scan. Bone biopsy/aspiration done at [facility] as is pending.  # Patient is a transplant candidate; given the absence of significant comorbidities. I would recommend RVD chemotherapy regimen for the patient. I discussed the multiple side effects with of each drug.   # Plan Zometa in 2 weeks/next visit  # Neck pain/neck mass- recommend dex 4 mg one/day; until surgery; and then stop post surgery. Recommend avoiding NSAIDs. New prescription for Percocet given.  # spoke to Dr.Goodwin; still awaiting on Biopsy results; planned surgery/stabilization of the neck later this week for next week. We'll also need radiation. We'll discuss with Dr. Donella Stade.  # Follow-up in 2 week with labs to discuss initiation of above treatment/ plan to start Zometa.   All questions were answered. The patient knows to call the clinic with any problems, questions or concerns.    Cammie Sickle, MD 03/28/2016 4:20 PM

## 2016-03-28 NOTE — Assessment & Plan Note (Addendum)
Multiple myeloma- monoclonal protein- IgG kappa; abnormal Kappa lambda light chain ratio; with the presence of multiple lytic lesions in the bone as on the CT scan. Bone biopsy/aspiration done at [facility] as is pending.  # Patient is a transplant candidate; given the absence of significant comorbidities. I would recommend RVD chemotherapy regimen for the patient. I discussed the multiple side effects with of each drug.   # Plan Zometa in 2 weeks/next visit  # Neck pain/neck mass- recommend dex 4 mg one/day; until surgery; and then stop post surgery. Recommend avoiding NSAIDs. New prescription for Percocet given.  # spoke to Dr.Goodwin; still awaiting on Biopsy results; planned surgery/stabilization of the neck later this week for next week. We'll also need radiation. We'll discuss with Dr. Donella Stade.  # Follow-up in 2 week with labs to discuss initiation of above treatment/ plan to start Zometa.

## 2016-04-03 ENCOUNTER — Telehealth: Payer: Self-pay | Admitting: Internal Medicine

## 2016-04-03 NOTE — Telephone Encounter (Signed)
Can he get replacement pads for his neck brace? They don't know what the procedure would be to get these. She went on website and it said they don't sell to pts directly and have to get them through provider. PleaseDS:[id number].

## 2016-04-04 NOTE — Telephone Encounter (Signed)
Rn spoke with wife. She will go to the drug store, [employer], [employer] or local [facility] to determine if the cervical collar foam would fit in her husband's cervical collar.  If she is unable to size this direction, she will go to the local medical supply store to determine the size she needs to pick up that would properly fit the collar size.  She will call our clinic back at a later time if she needs any additional assistance with this.

## 2016-04-05 DIAGNOSIS — I517 Cardiomegaly: Secondary | ICD-10-CM | POA: Diagnosis not present

## 2016-04-07 DIAGNOSIS — M4802 Spinal stenosis, cervical region: Secondary | ICD-10-CM | POA: Diagnosis not present

## 2016-04-07 DIAGNOSIS — C9020 Extramedullary plasmacytoma not having achieved remission: Secondary | ICD-10-CM | POA: Diagnosis not present

## 2016-04-07 DIAGNOSIS — D497 Neoplasm of unspecified behavior of endocrine glands and other parts of nervous system: Secondary | ICD-10-CM | POA: Diagnosis not present

## 2016-04-07 DIAGNOSIS — F17220 Nicotine dependence, chewing tobacco, uncomplicated: Secondary | ICD-10-CM | POA: Diagnosis not present

## 2016-04-07 DIAGNOSIS — C9030 Solitary plasmacytoma not having achieved remission: Secondary | ICD-10-CM | POA: Diagnosis not present

## 2016-04-07 DIAGNOSIS — M8458XA Pathological fracture in neoplastic disease, other specified site, initial encounter for fracture: Secondary | ICD-10-CM | POA: Diagnosis not present

## 2016-04-11 ENCOUNTER — Encounter: Payer: Self-pay | Admitting: *Deleted

## 2016-04-11 ENCOUNTER — Inpatient Hospital Stay: Payer: BLUE CROSS/BLUE SHIELD

## 2016-04-11 ENCOUNTER — Inpatient Hospital Stay (HOSPITAL_BASED_OUTPATIENT_CLINIC_OR_DEPARTMENT_OTHER): Payer: BLUE CROSS/BLUE SHIELD | Admitting: Internal Medicine

## 2016-04-11 ENCOUNTER — Inpatient Hospital Stay: Payer: BLUE CROSS/BLUE SHIELD | Attending: Internal Medicine

## 2016-04-11 VITALS — BP 111/87 | HR 103 | Temp 98.1°F | Resp 16 | Ht 72.0 in | Wt 297.3 lb

## 2016-04-11 DIAGNOSIS — N281 Cyst of kidney, acquired: Secondary | ICD-10-CM | POA: Diagnosis not present

## 2016-04-11 DIAGNOSIS — M8448XS Pathological fracture, other site, sequela: Secondary | ICD-10-CM | POA: Insufficient documentation

## 2016-04-11 DIAGNOSIS — C9000 Multiple myeloma not having achieved remission: Secondary | ICD-10-CM

## 2016-04-11 DIAGNOSIS — I700 Atherosclerosis of aorta: Secondary | ICD-10-CM | POA: Diagnosis not present

## 2016-04-11 DIAGNOSIS — M129 Arthropathy, unspecified: Secondary | ICD-10-CM | POA: Insufficient documentation

## 2016-04-11 DIAGNOSIS — Z79899 Other long term (current) drug therapy: Secondary | ICD-10-CM

## 2016-04-11 DIAGNOSIS — K5730 Diverticulosis of large intestine without perforation or abscess without bleeding: Secondary | ICD-10-CM | POA: Diagnosis not present

## 2016-04-11 DIAGNOSIS — C9030 Solitary plasmacytoma not having achieved remission: Secondary | ICD-10-CM | POA: Insufficient documentation

## 2016-04-11 DIAGNOSIS — Z8051 Family history of malignant neoplasm of kidney: Secondary | ICD-10-CM

## 2016-04-11 DIAGNOSIS — I2510 Atherosclerotic heart disease of native coronary artery without angina pectoris: Secondary | ICD-10-CM

## 2016-04-11 LAB — CBC WITH DIFFERENTIAL/PLATELET
BASOS PCT: 1 %
Basophils Absolute: 0 10*3/uL (ref 0–0.1)
Eosinophils Absolute: 0.2 10*3/uL (ref 0–0.7)
Eosinophils Relative: 2 %
HEMATOCRIT: 42.9 % (ref 40.0–52.0)
HEMOGLOBIN: 14.4 g/dL (ref 13.0–18.0)
LYMPHS ABS: 2 10*3/uL (ref 1.0–3.6)
LYMPHS PCT: 23 %
MCH: 33.1 pg (ref 26.0–34.0)
MCHC: 33.6 g/dL (ref 32.0–36.0)
MCV: 98.7 fL (ref 80.0–100.0)
MONO ABS: 0.8 10*3/uL (ref 0.2–1.0)
MONOS PCT: 10 %
NEUTROS ABS: 5.4 10*3/uL (ref 1.4–6.5)
NEUTROS PCT: 64 %
Platelets: 152 10*3/uL (ref 150–440)
RBC: 4.34 MIL/uL — ABNORMAL LOW (ref 4.40–5.90)
RDW: 15.6 % — AB (ref 11.5–14.5)
WBC: 8.4 10*3/uL (ref 3.8–10.6)

## 2016-04-11 LAB — COMPREHENSIVE METABOLIC PANEL
ALBUMIN: 3.3 g/dL — AB (ref 3.5–5.0)
ALK PHOS: 78 U/L (ref 38–126)
ALT: 18 U/L (ref 17–63)
ANION GAP: 6 (ref 5–15)
AST: 14 U/L — ABNORMAL LOW (ref 15–41)
BILIRUBIN TOTAL: 0.4 mg/dL (ref 0.3–1.2)
BUN: 19 mg/dL (ref 6–20)
CALCIUM: 8.7 mg/dL — AB (ref 8.9–10.3)
CO2: 30 mmol/L (ref 22–32)
Chloride: 102 mmol/L (ref 101–111)
Creatinine, Ser: 1.23 mg/dL (ref 0.61–1.24)
GFR calc Af Amer: >60 mL/min (ref >60)
GLUCOSE: 95 mg/dL (ref 65–99)
POTASSIUM: 3.9 mmol/L (ref 3.5–5.1)
Sodium: 138 mmol/L (ref 135–145)
TOTAL PROTEIN: 6.3 g/dL — AB (ref 6.5–8.1)

## 2016-04-11 MED ORDER — ZOLEDRONIC ACID 4 MG/100ML IV SOLN
4.0000 mg | Freq: Once | INTRAVENOUS | Status: AC
  Administered 2016-04-11: 4 mg via INTRAVENOUS
  Filled 2016-04-11: qty 100

## 2016-04-11 MED ORDER — SODIUM CHLORIDE 0.9 % IV SOLN
Freq: Once | INTRAVENOUS | Status: AC
  Administered 2016-04-11: 12:00:00 via INTRAVENOUS
  Filled 2016-04-11: qty 1000

## 2016-04-11 NOTE — Progress Notes (Signed)
celgene consent and revlimid consent signed in clinic today. Pt provided with verbal and written patient education on revlmiid and velcade. Pt given copy of consent form and literature from [facility]/cele[employer] [facility] program.  Reviewed side effects of revlimid, when to contact md, drug interactions, reviewed mail order pharmacy ordering process. I asked patient to take the revlimid at regular intervals and not to take it more often than directed. I asked him not to stop taking except on the doctor's advice and to report any missed dosages.  I reviewed with patient never to share drug with anyone, never donate blood while taking this drug, to use latex condom protection during sexual intercourse. He is not sexually active currently with anyone that could be potentially pregnant. Discussed that drug could cause birth defects.

## 2016-04-11 NOTE — Patient Instructions (Signed)
Please notify the doctor if you have any dental or jaw pain.

## 2016-04-11 NOTE — Progress Notes (Signed)
Since last visit had neck surgery.. No concerns, reports healing well although continues to have pain at incision site.

## 2016-04-11 NOTE — Patient Instructions (Signed)
Bortezomib injection What is this medicine? BORTEZOMIB (bor TEZ oh mib) is a medicine that targets proteins in cancer cells and stops the cancer cells from growing. It is used to treat multiple myeloma and mantle-cell lymphoma. This medicine may be used for other purposes; ask your health care provider or pharmacist if you have questions. What should I tell my health care provider before I take this medicine? They need to know if you have any of these conditions: -diabetes -heart disease -irregular heartbeat -liver disease -on hemodialysis -low blood counts, like low white blood cells, platelets, or hemoglobin -peripheral neuropathy -taking medicine for blood pressure -an unusual or allergic reaction to bortezomib, mannitol, boron, other medicines, foods, dyes, or preservatives -pregnant or trying to get pregnant -breast-feeding How should I use this medicine? This medicine is for injection into a vein or for injection under the skin. It is given by a health care professional in a hospital or clinic setting. Talk to your pediatrician regarding the use of this medicine in children. Special care may be needed. Overdosage: If you think you have taken too much of this medicine contact a poison control center or emergency room at once. NOTE: This medicine is only for you. Do not share this medicine with others. What if I miss a dose? It is important not to miss your dose. Call your doctor or health care professional if you are unable to keep an appointment. What may interact with this medicine? This medicine may interact with the following medications: -ketoconazole -rifampin -ritonavir -St. John's Wort This list may not describe all possible interactions. Give your health care provider a list of all the medicines, herbs, non-prescription drugs, or dietary supplements you use. Also tell them if you smoke, drink alcohol, or use illegal drugs. Some items may interact with your medicine. What  should I watch for while using this medicine? Visit your doctor for checks on your progress. This drug may make you feel generally unwell. This is not uncommon, as chemotherapy can affect healthy cells as well as cancer cells. Report any side effects. Continue your course of treatment even though you feel ill unless your doctor tells you to stop. You may get drowsy or dizzy. Do not drive, use machinery, or do anything that needs mental alertness until you know how this medicine affects you. Do not stand or sit up quickly, especially if you are an older patient. This reduces the risk of dizzy or fainting spells. In some cases, you may be given additional medicines to help with side effects. Follow all directions for their use. Call your doctor or health care professional for advice if you get a fever, chills or sore throat, or other symptoms of a cold or flu. Do not treat yourself. This drug decreases your body's ability to fight infections. Try to avoid being around people who are sick. This medicine may increase your risk to bruise or bleed. Call your doctor or health care professional if you notice any unusual bleeding. You may need blood work done while you are taking this medicine. In some patients, this medicine may cause a serious brain infection that may cause death. If you have any problems seeing, thinking, speaking, walking, or standing, tell your doctor right away. If you cannot reach your doctor, urgently seek other source of medical care. Do not become pregnant while taking this medicine. Women should inform their doctor if they wish to become pregnant or think they might be pregnant. There is a potential for serious  side effects to an unborn child. Talk to your health care professional or pharmacist for more information. Do not breast-feed an infant while taking this medicine. Check with your doctor or health care professional if you get an attack of severe diarrhea, nausea and vomiting, or if  you sweat a lot. The loss of too much body fluid can make it dangerous for you to take this medicine. What side effects may I notice from receiving this medicine? Side effects that you should report to your doctor or health care professional as soon as possible: -allergic reactions like skin rash, itching or hives, swelling of the face, lips, or tongue -breathing problems -changes in hearing -changes in vision -fast, irregular heartbeat -feeling faint or lightheaded, falls -pain, tingling, numbness in the hands or feet -right upper belly pain -seizures -swelling of the ankles, feet, hands -unusual bleeding or bruising -unusually weak or tired -vomiting -yellowing of the eyes or skin Side effects that usually do not require medical attention (report to your doctor or health care professional if they continue or are bothersome): -changes in emotions or moods -constipation -diarrhea -loss of appetite -headache -irritation at site where injected -nausea This list may not describe all possible side effects. Call your doctor for medical advice about side effects. You may report side effects to FDA at 1-800-FDA-1088. Where should I keep my medicine? This drug is given in a hospital or clinic and will not be stored at home. NOTE: This sheet is a summary. It may not cover all possible information. If you have questions about this medicine, talk to your doctor, pharmacist, or health care provider.    2016, [employer]. (2014-09-22 14:47:04)       Lenalidomide Oral Capsules What is this medicine? LENALIDOMIDE (len a LID oh mide) is a chemotherapy drug that targets specific proteins within cancer cells and stops the cancer cell from growing. It is used to treat multiple myeloma, mantle cell lymphoma, and some myelodysplastic syndromes that cause severe anemia requiring blood transfusions. This medicine may be used for other purposes; ask your health care provider or pharmacist  if you have questions. What should I tell my health care provider before I take this medicine? They need to know if you have any of these conditions: -blood clots in the legs or the lungs -high blood pressure -high cholesterol -infection -irregular monthly periods or menstrual cycles -kidney disease -liver disease -smoke tobacco -thyroid disease -an unusual or allergic reaction to lenalidomide, other medicines, foods, dyes, or preservatives -pregnant or trying to get pregnant -breast-feeding How should I use this medicine? Take this medicine by mouth with a glass of water. Follow the directions on the prescription label. Do not cut, crush, or chew this medicine. Take your medicine at regular intervals. Do not take it more often than directed. Do not stop taking except on your doctor's advice. A MedGuide will be given with each prescription and refill. Read this guide carefully each time. The MedGuide may change frequently. Talk to your pediatrician regarding the use of this medicine in children. Special care may be needed. Overdosage: If you think you have taken too much of this medicine contact a poison control center or emergency room at once. NOTE: This medicine is only for you. Do not share this medicine with others. What if I miss a dose? If you miss a dose, take it as soon as you can. If your next dose is to be taken in less than 12 hours, then do not take  the missed dose. Take the next dose at your regular time. Do not take double or extra doses. What may interact with this medicine? This medicine may interact with the following medications: -digoxin -medicines that increase the risk of thrombosis like estrogens or erythropoietic agents (e.g., epoetin alfa and darbepoetin alfa) -warfarin This list may not describe all possible interactions. Give your health care provider a list of all the medicines, herbs, non-prescription drugs, or dietary supplements you use. Also tell them if  you smoke, drink alcohol, or use illegal drugs. Some items may interact with your medicine. What should I watch for while using this medicine? Visit your doctor for regular check ups. Tell your doctor or healthcare professional if your symptoms do not start to get better or if they get worse. You will need to have important blood work done while you are taking this medicine. This medicine is available only through a special program. Doctors, pharmacies, and patients must meet all of the conditions of the program. Your health care provider will help you get signed up with the program if you need this medicine. Through the program you will only receive up to a 28 day supply of the medicine at one time. You will need a new prescription for each refill. This medicine can cause birth defects. Do not get pregnant while taking this drug. Females with child-bearing potential will need to have 2 negative pregnancy tests before starting this medicine. Pregnancy testing must be done every 2 to 4 weeks as directed while taking this medicine. Use 2 reliable forms of birth control together while you are taking this medicine and for 1 month after you stop taking this medicine. If you think that you might be pregnant talk to your doctor right away. Men must use a latex condom during sexual contact with a woman while taking this medicine and for 28 days after you stop taking this medicine. A latex condom is needed even if you have had a vasectomy. Contact your doctor right away if your partner becomes pregnant. Do not donate sperm while taking this medicine and for 28 days after you stop taking this medicine. Do not give blood while taking the medicine and for 1 month after completion of treatment to avoid exposing pregnant women to the medicine through the donated blood. Talk to your doctor about your risk of cancer. You may be more at risk for certain types of cancers if you take this medicine. What side effects may I  notice from receiving this medicine? Side effects that you should report to your doctor or health care professional as soon as possible: -allergic reactions like skin rash, itching or hives, swelling of the face, lips, or tongue -breathing problems -chest pain or tightness -fast, irregular heartbeat -low blood counts - this medicine may decrease the number of white blood cells, red blood cells and platelets. You may be at increased risk for infections and bleeding. -seizures -signs and symptoms of bleeding such as bloody or black, tarry stools; red or dark-brown urine; spitting up blood or brown material that looks like coffee grounds; red spots on the skin; unusual bruising or bleeding from the eye, gums, or nose -signs and symptoms of a blood clot such as breathing problems; changes in vision; chest pain; severe, sudden headache; pain, swelling, warmth in the leg; trouble speaking; sudden numbness or weakness of the face, arm or leg -signs and symptoms of liver injury like dark yellow or brown urine; general ill feeling or flu-like symptoms;  light-colored stools; loss of appetite; nausea; right upper belly pain; unusually weak or tired; yellowing of the eyes or skin -signs and symptoms of a stroke like changes in vision; confusion; trouble speaking or understanding; severe headaches; sudden numbness or weakness of the face, arm or leg; trouble walking; dizziness; loss of balance or coordination -sweating -vomiting Side effects that usually do not require medical attention (report to your doctor or health care professional if they continue or are bothersome): -constipation -cough -diarrhea -tiredness This list may not describe all possible side effects. Call your doctor for medical advice about side effects. You may report side effects to FDA at 1-800-FDA-1088. Where should I keep my medicine? Keep out of the reach of children. Store at room temperature between 15 and 30 degrees C (59 and 86  degrees F). Throw away any unused medicine after the expiration date. NOTE: This sheet is a summary. It may not cover all possible information. If you have questions about this medicine, talk to your doctor, pharmacist, or health care provider.    2016, [employer]. (2013-10-28 18:30:01)

## 2016-04-11 NOTE — Progress Notes (Signed)
[place] NOTE  Patient Care Team: Arlis Porta., MD as PCP - General (Family Medicine)  CHIEF COMPLAINTS/PURPOSE OF CONSULTATION: Neck mass  #  Oncology History   # July- AUG 2017- MULTIPLE MYELOMA [place]- M- 2.1gm; K:L=207/7=29]; Beta-2 micro-2.1 STAGE I-Lumbar spine lesion- Bx- "ANAPLASTIC PLASMCYTOMA" [no cyto/fish]  # MULTIPLE LYTIC BONE LESIONS; C3 path Fx/soft tissue mass [Sep 1st 2017 s/p cervical spine fusion; Dr.Goodwin; [facility]]     Multiple myeloma without remission ([place])   03/21/2016 Initial Diagnosis    Multiple myeloma without remission ([place])        HISTORY OF PRESENTING ILLNESS:  Kyle Parker 60 y.o.  male  most likely multiple myeloma involving the C3/compression fracture and multiple bone lesions is here for follow-up  Cervical spine fusion/stabilization at [facility].  He just got discharged yesterday.   Patient recuperating well from He has mild  From his recent surgery.  Is currently not on steroids. He still needing to take Percocet as needed. Patient continues to wear his neck brace.   Patient denies any weight loss. Denies any unusual shortness of breath. Denies any chest pain. Denies any cough.  No nausea no vomiting.  ROS: A complete 10 point review of system is done which is negative except mentioned above in history of present illness  MEDICAL HISTORY:  Past Medical History:  Diagnosis Date  . Arthritis   . Compression fracture of cervical spine at C2-C3 level   . Lytic bone lesions on xray   . Multiple myeloma without remission ([place])     SURGICAL HISTORY: Past Surgical History:  Procedure Laterality Date  . biopsy of the vertebral body     . CT GUIDED BONE BIOPSY  03/23/2016    SOCIAL HISTORY: lives in [place]; shows tobacco smoking. No alcohol. Lives with his wife at home. Works for [employer]  . Marital status: Married    Spouse name: N/A  . Number of children: N/A  .  Years of education: N/A   Occupational History  . Not on file.   Social History Main Topics  . Smoking status: Never Smoker  . Smokeless tobacco: Current User    Types: Chew  . Alcohol use No  . Drug use: No  . Sexual activity: Not on file   Other Topics Concern  . Not on file   Social History Narrative  . No narrative on file    FAMILY HISTORY: Family History  Problem Relation Age of Onset  . Heart attack Father   . Renal cancer Father   . CAD Mother   . Diabetes Mellitus II Mother    Father had kidney cancer.  ALLERGIES:  has No Known Allergies.  MEDICATIONS:  Current Outpatient Prescriptions  Medication Sig Dispense Refill  . acetaminophen (TYLENOL) 325 MG tablet Take by mouth.    . cyclobenzaprine (FLEXERIL) 10 MG tablet Take 1 tablet (10 mg total) by mouth at bedtime. 30 tablet 0  . metaxalone (SKELAXIN) 800 MG tablet Take 1 tablet (800 mg total) by mouth 3 (three) times daily. For muscle spasm 30 tablet 3  . oxyCODONE (OXY IR/ROXICODONE) 5 MG immediate release tablet Take by mouth.    . polyethylene glycol (MIRALAX / GLYCOLAX) packet Take by mouth.    . senna-docusate (SENOKOT-S) 8.6-50 MG tablet Take by mouth.    Marland Kitchen acyclovir (ZOVIRAX) 400 MG tablet Take 1 tablet (400 mg total) by mouth 2 (two) times daily. (  Patient not taking: Reported on 03/28/2016) 60 tablet 3  . dexamethasone (DECADRON) 4 MG tablet Take 1 tablet (4 mg total) by mouth 3 (three) times daily. Take it with food. (Patient not taking: Reported on 04/11/2016) 30 tablet 0  . dexamethasone (DECADRON) 4 MG tablet Take 10 tablets (40 mg) on days 1, 8, and 15 of chemo. Repeat every 21 days. (Patient not taking: Reported on 04/11/2016) 30 tablet 3  . lenalidomide (REVLIMID) 25 MG capsule Take one capsule daily on days 1-14 every 21 days. (Patient not taking: Reported on 03/28/2016) 14 capsule 3  . ondansetron (ZOFRAN) 8 MG tablet Take 1 tablet (8 mg total) by mouth 2 (two) times daily as needed (Nausea or  vomiting). (Patient not taking: Reported on 03/28/2016) 30 tablet 1  . oxyCODONE-acetaminophen (PERCOCET) 10-325 MG tablet Take 1 tablet by mouth every 4 (four) hours as needed for pain. (Patient not taking: Reported on 04/11/2016) 60 tablet 0  . prochlorperazine (COMPAZINE) 10 MG tablet Take 1 tablet (10 mg total) by mouth every 6 (six) hours as needed (Nausea or vomiting). (Patient not taking: Reported on 03/28/2016) 30 tablet 1   No current facility-administered medications for this visit.       Marland Kitchen  PHYSICAL EXAMINATION: ECOG PERFORMANCE STATUS: 1 - Symptomatic but completely ambulatory  Vitals:   04/11/16 1033  BP: 111/87  Pulse: (!) 103  Resp: 16  Temp: 98.1 F (36.7 C)   Filed Weights   04/11/16 1033  Weight: 297 lb 4.6 oz (134.8 kg)    GENERAL: Well-nourished well-developed; Alert, no distress and comfortable.  He is accompanied by his wife. EYES: no pallor or icterus OROPHARYNX: no thrush or ulceration; good dentition  NECK: supple, no masses felt; in neck brace. LYMPH:  no palpable lymphadenopathy in the cervical, axillary or inguinal regions LUNGS: clear to auscultation and  No wheeze or crackles HEART/CVS: regular rate & rhythm and no murmurs; No lower extremity edema ABDOMEN: abdomen soft, non-tender and normal bowel sounds Musculoskeletal:no cyanosis of digits and no clubbing  PSYCH: alert & oriented x 3 with fluent speech NEURO: no focal motor/sensory deficits SKIN:  no rashes or significant lesions  LABORATORY DATA:  I have reviewed the data as listed Lab Results  Component Value Date   WBC 8.4 04/11/2016   HGB 14.4 04/11/2016   HCT 42.9 04/11/2016   MCV 98.7 04/11/2016   PLT 152 04/11/2016    Recent Labs  12/10/15 1359 02/07/16 0950  03/16/16 0838 03/28/16 1435 04/11/16 1016  NA  --   --   < > 135 138 138  K  --   --   < > 3.6 4.3 3.9  CL  --   --   < > 103 107 102  CO2  --   --   < > 26 24 30   GLUCOSE  --   --   < > 92 99 95  BUN  --   --   <  > 21* 25* 19  CREATININE  --   --   < > 0.92 0.79 1.23  CALCIUM  --   --   < > 8.9 8.6* 8.7*  GFRNONAA  --   --   < > >60 >60 >60  GFRAA  --   --   < > >60 >60 >60  PROT 7.3 7.7  --  9.0* 6.5 6.3*  ALBUMIN 4.3 4.1  --  4.3 3.5 3.3*  AST 14 21  --  29  13* 14*  ALT 14 16  --  15* 20 18  ALKPHOS 76 73  --  65 75 78  BILITOT 0.4 0.3  --  0.5 0.8 0.4  BILIDIR 0.12 0.08  --   --   --   --   < > = values in this interval not displayed.  RADIOGRAPHIC STUDIES: I have personally reviewed the radiological images as listed and agreed with the findings in the report. Ct Chest W Contrast  Result Date: 03/15/2016 CLINICAL DATA:  60 year old male with recent diagnosis of pathologic C3 vertebral body fracture and additional suspicious bone lesions in the C5 vertebral body and clivus on recent cervical spine MRI. Metastatic / malignant bone lesions are suspected. Patient presents for staging evaluation. EXAM: CT CHEST, ABDOMEN, AND PELVIS WITH CONTRAST TECHNIQUE: Multidetector CT imaging of the chest, abdomen and pelvis was performed following the standard protocol during bolus administration of intravenous contrast. CONTRAST:  125 cc Isovue 370 IV. COMPARISON:  None. FINDINGS: CT CHEST FINDINGS Mediastinum/Nodes: Normal heart size. No significant pericardial fluid/thickening. Left anterior descending, left circumflex and right coronary atherosclerosis. Great vessels are normal in course and caliber. No central pulmonary emboli. No discrete thyroid nodules. Unremarkable esophagus. No pathologically enlarged axillary, mediastinal or hilar lymph nodes. Lungs/Pleura: No pneumothorax. No pleural effusion. No acute consolidative airspace disease lung masses. There are tiny 3 mm subpleural pulmonary nodules in the basilar right upper lobe (series 5/image 78) and peripheral left lower lobe (series 5/ image 111). No additional significant pulmonary nodules. Musculoskeletal: There are innumerable small scattered lytic bone  lesions throughout the thoracic vertebral bodies (largest at T10 on series 603/image 88), sternum, bilateral ribs and bilateral shoulder girdles. No appreciable extraosseous soft tissue components to the lytic bone lesions. CT ABDOMEN PELVIS FINDINGS Hepatobiliary: Normal liver with no liver mass. Normal gallbladder with no radiopaque cholelithiasis. No biliary ductal dilatation. Pancreas: Normal, with no mass or duct dilation. Spleen: Normal size. No mass. Adrenals/Urinary Tract: Normal adrenals. No hydronephrosis. Simple renal cysts are present in both kidneys measuring up to 8.7 cm in the lower right kidney and 6.7 cm in the posterior interpolar left kidney. Additional too small to characterize subcentimeter hypodense renal cortical lesions throughout both kidneys, which do not require further follow-up. Normal bladder. Stomach/Bowel: Grossly normal stomach. Normal caliber small bowel with no small bowel wall thickening. Normal appendix. Mild sigmoid diverticulosis, with no large bowel wall thickening or pericolonic fat stranding. Vascular/Lymphatic: Mildly atherosclerotic nonaneurysmal abdominal aorta. Patent portal, splenic, hepatic and renal veins. No pathologically enlarged lymph nodes in the abdomen. Mildly enlarged 1.1 cm left external iliac node (series 2/image 121). No additional pathologically enlarged pelvic nodes. Reproductive: Normal size prostate. Other: No pneumoperitoneum, ascites or focal fluid collection. Musculoskeletal: There are several scattered lytic bone lesions throughout the lumbar vertebral bodies, largest in the anterior L1 vertebral body (series 603/ image 87). Tiny lytic bone lesions are seen scattered in the sacrum, bilateral iliac bones and proximal femora. IMPRESSION: 1. Innumerable scattered small lytic bone lesions throughout the axial and proximal appendicular skeleton. No primary neoplasm is evident in the chest, abdomen or pelvis. Considerations include multiple myeloma or  lytic osseous metastases from an occult primary. 2. Solitary mildly enlarged left external iliac lymph node. No additional sites of lymphadenopathy. 3. Two tiny 3 mm subpleural pulmonary nodules, probably benign. Consider attention on follow-up chest CT in 3-6 months depending on the results of the work-up for the bone lesions. 4. Aortic atherosclerosis.  Three-vessel coronary atherosclerosis. 5. Mild  sigmoid diverticulosis. Electronically Signed   By: Ilona Sorrel M.D.   On: 03/15/2016 13:08   Ct Abdomen Pelvis W Contrast  Result Date: 03/15/2016 CLINICAL DATA:  60 year old male with recent diagnosis of pathologic C3 vertebral body fracture and additional suspicious bone lesions in the C5 vertebral body and clivus on recent cervical spine MRI. Metastatic / malignant bone lesions are suspected. Patient presents for staging evaluation. EXAM: CT CHEST, ABDOMEN, AND PELVIS WITH CONTRAST TECHNIQUE: Multidetector CT imaging of the chest, abdomen and pelvis was performed following the standard protocol during bolus administration of intravenous contrast. CONTRAST:  125 cc Isovue 370 IV. COMPARISON:  None. FINDINGS: CT CHEST FINDINGS Mediastinum/Nodes: Normal heart size. No significant pericardial fluid/thickening. Left anterior descending, left circumflex and right coronary atherosclerosis. Great vessels are normal in course and caliber. No central pulmonary emboli. No discrete thyroid nodules. Unremarkable esophagus. No pathologically enlarged axillary, mediastinal or hilar lymph nodes. Lungs/Pleura: No pneumothorax. No pleural effusion. No acute consolidative airspace disease lung masses. There are tiny 3 mm subpleural pulmonary nodules in the basilar right upper lobe (series 5/image 78) and peripheral left lower lobe (series 5/ image 111). No additional significant pulmonary nodules. Musculoskeletal: There are innumerable small scattered lytic bone lesions throughout the thoracic vertebral bodies (largest at T10 on  series 603/image 88), sternum, bilateral ribs and bilateral shoulder girdles. No appreciable extraosseous soft tissue components to the lytic bone lesions. CT ABDOMEN PELVIS FINDINGS Hepatobiliary: Normal liver with no liver mass. Normal gallbladder with no radiopaque cholelithiasis. No biliary ductal dilatation. Pancreas: Normal, with no mass or duct dilation. Spleen: Normal size. No mass. Adrenals/Urinary Tract: Normal adrenals. No hydronephrosis. Simple renal cysts are present in both kidneys measuring up to 8.7 cm in the lower right kidney and 6.7 cm in the posterior interpolar left kidney. Additional too small to characterize subcentimeter hypodense renal cortical lesions throughout both kidneys, which do not require further follow-up. Normal bladder. Stomach/Bowel: Grossly normal stomach. Normal caliber small bowel with no small bowel wall thickening. Normal appendix. Mild sigmoid diverticulosis, with no large bowel wall thickening or pericolonic fat stranding. Vascular/Lymphatic: Mildly atherosclerotic nonaneurysmal abdominal aorta. Patent portal, splenic, hepatic and renal veins. No pathologically enlarged lymph nodes in the abdomen. Mildly enlarged 1.1 cm left external iliac node (series 2/image 121). No additional pathologically enlarged pelvic nodes. Reproductive: Normal size prostate. Other: No pneumoperitoneum, ascites or focal fluid collection. Musculoskeletal: There are several scattered lytic bone lesions throughout the lumbar vertebral bodies, largest in the anterior L1 vertebral body (series 603/ image 87). Tiny lytic bone lesions are seen scattered in the sacrum, bilateral iliac bones and proximal femora. IMPRESSION: 1. Innumerable scattered small lytic bone lesions throughout the axial and proximal appendicular skeleton. No primary neoplasm is evident in the chest, abdomen or pelvis. Considerations include multiple myeloma or lytic osseous metastases from an occult primary. 2. Solitary mildly  enlarged left external iliac lymph node. No additional sites of lymphadenopathy. 3. Two tiny 3 mm subpleural pulmonary nodules, probably benign. Consider attention on follow-up chest CT in 3-6 months depending on the results of the work-up for the bone lesions. 4. Aortic atherosclerosis.  Three-vessel coronary atherosclerosis. 5. Mild sigmoid diverticulosis. Electronically Signed   By: Ilona Sorrel M.D.   On: 03/15/2016 13:08    ASSESSMENT & PLAN:   Multiple myeloma without remission ([facility]) Multiple myeloma- monoclonal protein- IgG kappa; abnormal Kappa lambda light chain ratio; with the presence of multiple lytic lesions in the bone as on the CT scan. Lumbar fusion biopsy-[facility]  anaplastic plasmacytoma. Discussed with Dr. Mina Marble- cytogenetics FISH not done.  # Patient is a transplant candidate; given the absence of significant comorbidities. I would recommend RVD chemotherapy regimen for the patient. Likely plan starting this next week [9/12]  # Neck pain/neck mass-currently improved. Currently off steroids. Mild-to-moderate postsurgical/incisional pain. Once given clearance from neurosurgery with regards radiation- I will give a break to systemic therapy and then proceed with radiation.   # I will see the patient back on 9/19- to review the results of the bone marrow biopsy.  # Also referred to radiation oncology. I left a message for Dr. Nikki Dom.   All questions were answered. The patient knows to call the clinic with any problems, questions or concerns.    Cammie Sickle, MD 04/11/2016 5:49 PM

## 2016-04-11 NOTE — Assessment & Plan Note (Addendum)
Multiple myeloma- monoclonal protein- IgG kappa; abnormal Kappa lambda light chain ratio; with the presence of multiple lytic lesions in the bone as on the CT scan. Lumbar fusion biopsy-[facility] anaplastic plasmacytoma. Discussed with Dr. Mina Marble- cytogenetics FISH not done.  # Patient is a transplant candidate; given the absence of significant comorbidities. I would recommend RVD chemotherapy regimen for the patient. Likely plan starting this next week [9/12]  # Neck pain/neck mass-currently improved. Currently off steroids. Mild-to-moderate postsurgical/incisional pain. Once given clearance from neurosurgery with regards radiation- I will give a break to systemic therapy and then proceed with radiation.   # I will see the patient back on 9/19- to review the results of the bone marrow biopsy.  # Also referred to radiation oncology. I left a message for Dr. Nikki Dom.

## 2016-04-13 ENCOUNTER — Other Ambulatory Visit: Payer: BLUE CROSS/BLUE SHIELD

## 2016-04-13 ENCOUNTER — Inpatient Hospital Stay: Payer: BLUE CROSS/BLUE SHIELD

## 2016-04-13 ENCOUNTER — Telehealth: Payer: Self-pay | Admitting: Internal Medicine

## 2016-04-13 ENCOUNTER — Ambulatory Visit: Payer: BLUE CROSS/BLUE SHIELD | Admitting: Family Medicine

## 2016-04-13 NOTE — Telephone Encounter (Signed)
Patient has decided instead of going back and forth between [facility] and [facility] that they would like to do everything in one place and will continue with [facility]. They are transferring all care to that facility and asked that we cancel upcoming appts. They did ask that we tell Dr. Rogue Bussing and his nurses and the rest of the staff that they have really appreciated everyone and the care they received here. Thank you.

## 2016-04-13 NOTE — Telephone Encounter (Signed)
Spoke with patient's wife, Amy.  She states that she only had an apt for her husband-Dr. Maudie Mercury in radiation oncology and Dr. Nikki Dom. She states that "we have an appointment on 04/19/16, we can take care of this at that time."  Dr. Rogue Bussing advised pt's wife that her husband "needs an appointment asap with medical oncology and not to wait until 04/19/16 to arrange this appointment.  This will avoid delays in medical care."  On clinical note, pt's rx for revlimid has already been [facility].  Consents signed with [facility]. Msg rcvd yesterday from [facility] that rx for revlimid was transferred to [facility].  At this time, any need for prior auth for revlimid will not be performed as patient has asked to be transferred care to [facility] per Dr. Rogue Bussing.   Patient's wife was instructed that he is not to start on revlmid (if shipment received) until he has an oncologist established. New oncologist at [facility] will need to determine when it is safe to start drug and monitor labs.  Wife gave verbal understanding per Dr. Rogue Bussing.

## 2016-04-14 ENCOUNTER — Telehealth: Payer: Self-pay | Admitting: Internal Medicine

## 2016-04-14 ENCOUNTER — Ambulatory Visit: Admission: RE | Admit: 2016-04-14 | Payer: BLUE CROSS/BLUE SHIELD | Source: Ambulatory Visit

## 2016-04-14 NOTE — Telephone Encounter (Signed)
Patient's wife cancel all appointments with [place]. Plans follow-up at [facility]; appointment for radiation oncology next week. Spoke to patient's wife regarding the importance of making a medical oncology appointment ASAP. She verbalized understanding. Also inform Dr. Nikki Dom; neurosurgery of the above changes in care plan.

## 2016-04-18 ENCOUNTER — Other Ambulatory Visit: Payer: BLUE CROSS/BLUE SHIELD

## 2016-04-19 DIAGNOSIS — C9000 Multiple myeloma not having achieved remission: Secondary | ICD-10-CM | POA: Diagnosis not present

## 2016-04-19 DIAGNOSIS — M8458XS Pathological fracture in neoplastic disease, other specified site, sequela: Secondary | ICD-10-CM | POA: Diagnosis not present

## 2016-04-19 DIAGNOSIS — G9589 Other specified diseases of spinal cord: Secondary | ICD-10-CM | POA: Diagnosis not present

## 2016-04-20 ENCOUNTER — Ambulatory Visit: Admission: RE | Admit: 2016-04-20 | Payer: BLUE CROSS/BLUE SHIELD | Source: Ambulatory Visit

## 2016-04-21 DIAGNOSIS — C9000 Multiple myeloma not having achieved remission: Secondary | ICD-10-CM | POA: Diagnosis not present

## 2016-04-24 DIAGNOSIS — C9000 Multiple myeloma not having achieved remission: Secondary | ICD-10-CM | POA: Diagnosis not present

## 2016-04-25 ENCOUNTER — Other Ambulatory Visit: Payer: BLUE CROSS/BLUE SHIELD

## 2016-04-25 ENCOUNTER — Ambulatory Visit: Payer: BLUE CROSS/BLUE SHIELD

## 2016-04-25 DIAGNOSIS — C9000 Multiple myeloma not having achieved remission: Secondary | ICD-10-CM | POA: Diagnosis not present

## 2016-04-25 DIAGNOSIS — Z79899 Other long term (current) drug therapy: Secondary | ICD-10-CM | POA: Diagnosis not present

## 2016-04-25 DIAGNOSIS — M8448XD Pathological fracture, other site, subsequent encounter for fracture with routine healing: Secondary | ICD-10-CM | POA: Diagnosis not present

## 2016-04-25 DIAGNOSIS — F17220 Nicotine dependence, chewing tobacco, uncomplicated: Secondary | ICD-10-CM | POA: Diagnosis not present

## 2016-04-25 LAB — HIV ANTIBODY (ROUTINE TESTING W REFLEX): HIV 1&2 Ab, 4th Generation: NONREACTIVE

## 2016-04-26 DIAGNOSIS — C9000 Multiple myeloma not having achieved remission: Secondary | ICD-10-CM | POA: Diagnosis not present

## 2016-04-27 DIAGNOSIS — C9000 Multiple myeloma not having achieved remission: Secondary | ICD-10-CM | POA: Diagnosis not present

## 2016-04-28 ENCOUNTER — Ambulatory Visit: Payer: BLUE CROSS/BLUE SHIELD

## 2016-04-28 DIAGNOSIS — M8458XD Pathological fracture in neoplastic disease, other specified site, subsequent encounter for fracture with routine healing: Secondary | ICD-10-CM | POA: Diagnosis not present

## 2016-04-28 DIAGNOSIS — G543 Thoracic root disorders, not elsewhere classified: Secondary | ICD-10-CM | POA: Diagnosis not present

## 2016-04-28 DIAGNOSIS — C9030 Solitary plasmacytoma not having achieved remission: Secondary | ICD-10-CM | POA: Diagnosis not present

## 2016-04-28 DIAGNOSIS — C9000 Multiple myeloma not having achieved remission: Secondary | ICD-10-CM | POA: Diagnosis not present

## 2016-05-01 DIAGNOSIS — C9000 Multiple myeloma not having achieved remission: Secondary | ICD-10-CM | POA: Diagnosis not present

## 2016-05-02 ENCOUNTER — Ambulatory Visit: Payer: BLUE CROSS/BLUE SHIELD

## 2016-05-02 ENCOUNTER — Other Ambulatory Visit: Payer: BLUE CROSS/BLUE SHIELD

## 2016-05-02 DIAGNOSIS — C9000 Multiple myeloma not having achieved remission: Secondary | ICD-10-CM | POA: Diagnosis not present

## 2016-05-03 DIAGNOSIS — C9000 Multiple myeloma not having achieved remission: Secondary | ICD-10-CM | POA: Diagnosis not present

## 2016-05-04 DIAGNOSIS — C9000 Multiple myeloma not having achieved remission: Secondary | ICD-10-CM | POA: Diagnosis not present

## 2016-05-05 ENCOUNTER — Ambulatory Visit: Payer: BLUE CROSS/BLUE SHIELD

## 2016-05-05 DIAGNOSIS — C9000 Multiple myeloma not having achieved remission: Secondary | ICD-10-CM | POA: Diagnosis not present

## 2016-05-08 DIAGNOSIS — C9000 Multiple myeloma not having achieved remission: Secondary | ICD-10-CM | POA: Diagnosis not present

## 2016-05-15 DIAGNOSIS — N179 Acute kidney failure, unspecified: Secondary | ICD-10-CM | POA: Diagnosis not present

## 2016-05-15 DIAGNOSIS — A419 Sepsis, unspecified organism: Secondary | ICD-10-CM | POA: Diagnosis not present

## 2016-05-15 DIAGNOSIS — N170 Acute kidney failure with tubular necrosis: Secondary | ICD-10-CM | POA: Diagnosis not present

## 2016-05-15 DIAGNOSIS — R1312 Dysphagia, oropharyngeal phase: Secondary | ICD-10-CM | POA: Diagnosis not present

## 2016-05-15 DIAGNOSIS — F17220 Nicotine dependence, chewing tobacco, uncomplicated: Secondary | ICD-10-CM | POA: Diagnosis not present

## 2016-05-15 DIAGNOSIS — B370 Candidal stomatitis: Secondary | ICD-10-CM | POA: Diagnosis not present

## 2016-05-15 DIAGNOSIS — R4182 Altered mental status, unspecified: Secondary | ICD-10-CM | POA: Diagnosis not present

## 2016-05-15 DIAGNOSIS — R1310 Dysphagia, unspecified: Secondary | ICD-10-CM | POA: Diagnosis not present

## 2016-05-15 DIAGNOSIS — E870 Hyperosmolality and hypernatremia: Secondary | ICD-10-CM | POA: Diagnosis not present

## 2016-05-15 DIAGNOSIS — E8809 Other disorders of plasma-protein metabolism, not elsewhere classified: Secondary | ICD-10-CM | POA: Diagnosis not present

## 2016-05-15 DIAGNOSIS — R531 Weakness: Secondary | ICD-10-CM | POA: Diagnosis not present

## 2016-05-15 DIAGNOSIS — R918 Other nonspecific abnormal finding of lung field: Secondary | ICD-10-CM | POA: Diagnosis not present

## 2016-05-15 DIAGNOSIS — D696 Thrombocytopenia, unspecified: Secondary | ICD-10-CM | POA: Diagnosis not present

## 2016-05-15 DIAGNOSIS — R509 Fever, unspecified: Secondary | ICD-10-CM | POA: Diagnosis not present

## 2016-05-15 DIAGNOSIS — R404 Transient alteration of awareness: Secondary | ICD-10-CM | POA: Diagnosis not present

## 2016-05-15 DIAGNOSIS — N281 Cyst of kidney, acquired: Secondary | ICD-10-CM | POA: Diagnosis not present

## 2016-05-15 DIAGNOSIS — J690 Pneumonitis due to inhalation of food and vomit: Secondary | ICD-10-CM | POA: Diagnosis not present

## 2016-05-15 DIAGNOSIS — M8448XA Pathological fracture, other site, initial encounter for fracture: Secondary | ICD-10-CM | POA: Diagnosis not present

## 2016-05-15 DIAGNOSIS — C9000 Multiple myeloma not having achieved remission: Secondary | ICD-10-CM | POA: Diagnosis not present

## 2016-05-15 DIAGNOSIS — K208 Other esophagitis: Secondary | ICD-10-CM | POA: Diagnosis not present

## 2016-05-15 DIAGNOSIS — M8458XS Pathological fracture in neoplastic disease, other specified site, sequela: Secondary | ICD-10-CM | POA: Diagnosis not present

## 2016-05-15 DIAGNOSIS — Z981 Arthrodesis status: Secondary | ICD-10-CM | POA: Diagnosis not present

## 2016-05-15 DIAGNOSIS — E860 Dehydration: Secondary | ICD-10-CM | POA: Diagnosis not present

## 2016-05-16 DIAGNOSIS — N281 Cyst of kidney, acquired: Secondary | ICD-10-CM | POA: Diagnosis not present

## 2016-05-16 DIAGNOSIS — Z981 Arthrodesis status: Secondary | ICD-10-CM | POA: Diagnosis not present

## 2016-05-16 DIAGNOSIS — C9000 Multiple myeloma not having achieved remission: Secondary | ICD-10-CM | POA: Diagnosis not present

## 2016-05-16 DIAGNOSIS — M8448XA Pathological fracture, other site, initial encounter for fracture: Secondary | ICD-10-CM | POA: Diagnosis not present

## 2016-05-16 DIAGNOSIS — N179 Acute kidney failure, unspecified: Secondary | ICD-10-CM | POA: Diagnosis not present

## 2016-05-17 DIAGNOSIS — R531 Weakness: Secondary | ICD-10-CM | POA: Diagnosis not present

## 2016-05-17 DIAGNOSIS — R918 Other nonspecific abnormal finding of lung field: Secondary | ICD-10-CM | POA: Diagnosis not present

## 2016-05-17 DIAGNOSIS — C9000 Multiple myeloma not having achieved remission: Secondary | ICD-10-CM | POA: Diagnosis not present

## 2016-05-17 DIAGNOSIS — N179 Acute kidney failure, unspecified: Secondary | ICD-10-CM | POA: Diagnosis not present

## 2016-05-17 DIAGNOSIS — M8458XS Pathological fracture in neoplastic disease, other specified site, sequela: Secondary | ICD-10-CM | POA: Diagnosis not present

## 2016-05-17 DIAGNOSIS — R4182 Altered mental status, unspecified: Secondary | ICD-10-CM | POA: Diagnosis not present

## 2016-05-18 DIAGNOSIS — C9000 Multiple myeloma not having achieved remission: Secondary | ICD-10-CM | POA: Diagnosis not present

## 2016-05-18 DIAGNOSIS — R531 Weakness: Secondary | ICD-10-CM | POA: Diagnosis not present

## 2016-05-18 DIAGNOSIS — R1312 Dysphagia, oropharyngeal phase: Secondary | ICD-10-CM | POA: Diagnosis not present

## 2016-05-18 DIAGNOSIS — R4182 Altered mental status, unspecified: Secondary | ICD-10-CM | POA: Diagnosis not present

## 2016-05-18 DIAGNOSIS — N179 Acute kidney failure, unspecified: Secondary | ICD-10-CM | POA: Diagnosis not present

## 2016-05-19 DIAGNOSIS — N179 Acute kidney failure, unspecified: Secondary | ICD-10-CM | POA: Diagnosis not present

## 2016-05-19 DIAGNOSIS — R4182 Altered mental status, unspecified: Secondary | ICD-10-CM | POA: Diagnosis not present

## 2016-05-19 DIAGNOSIS — R531 Weakness: Secondary | ICD-10-CM | POA: Diagnosis not present

## 2016-05-20 DIAGNOSIS — C9000 Multiple myeloma not having achieved remission: Secondary | ICD-10-CM | POA: Diagnosis not present

## 2016-05-23 DIAGNOSIS — E876 Hypokalemia: Secondary | ICD-10-CM | POA: Diagnosis not present

## 2016-05-23 DIAGNOSIS — Z7189 Other specified counseling: Secondary | ICD-10-CM | POA: Diagnosis not present

## 2016-05-23 DIAGNOSIS — K5900 Constipation, unspecified: Secondary | ICD-10-CM | POA: Diagnosis not present

## 2016-05-23 DIAGNOSIS — Z515 Encounter for palliative care: Secondary | ICD-10-CM | POA: Diagnosis not present

## 2016-05-23 DIAGNOSIS — M542 Cervicalgia: Secondary | ICD-10-CM | POA: Diagnosis not present

## 2016-05-23 DIAGNOSIS — C9000 Multiple myeloma not having achieved remission: Secondary | ICD-10-CM | POA: Diagnosis not present

## 2016-05-23 DIAGNOSIS — Z5112 Encounter for antineoplastic immunotherapy: Secondary | ICD-10-CM | POA: Diagnosis not present

## 2016-05-24 DIAGNOSIS — M8458XS Pathological fracture in neoplastic disease, other specified site, sequela: Secondary | ICD-10-CM | POA: Diagnosis not present

## 2016-05-26 DIAGNOSIS — C9000 Multiple myeloma not having achieved remission: Secondary | ICD-10-CM | POA: Diagnosis not present

## 2016-05-26 DIAGNOSIS — M542 Cervicalgia: Secondary | ICD-10-CM | POA: Diagnosis not present

## 2016-05-29 DIAGNOSIS — M542 Cervicalgia: Secondary | ICD-10-CM | POA: Diagnosis not present

## 2016-05-30 DIAGNOSIS — C9000 Multiple myeloma not having achieved remission: Secondary | ICD-10-CM | POA: Diagnosis not present

## 2016-06-06 DIAGNOSIS — Z5111 Encounter for antineoplastic chemotherapy: Secondary | ICD-10-CM | POA: Diagnosis not present

## 2016-06-06 DIAGNOSIS — K5900 Constipation, unspecified: Secondary | ICD-10-CM | POA: Diagnosis not present

## 2016-06-06 DIAGNOSIS — M545 Low back pain: Secondary | ICD-10-CM | POA: Diagnosis not present

## 2016-06-06 DIAGNOSIS — C9000 Multiple myeloma not having achieved remission: Secondary | ICD-10-CM | POA: Diagnosis not present

## 2016-06-06 DIAGNOSIS — Z79899 Other long term (current) drug therapy: Secondary | ICD-10-CM | POA: Diagnosis not present

## 2016-06-06 DIAGNOSIS — N179 Acute kidney failure, unspecified: Secondary | ICD-10-CM | POA: Diagnosis not present

## 2016-06-09 DIAGNOSIS — C9000 Multiple myeloma not having achieved remission: Secondary | ICD-10-CM | POA: Diagnosis not present

## 2016-06-13 DIAGNOSIS — Z981 Arthrodesis status: Secondary | ICD-10-CM | POA: Diagnosis not present

## 2016-06-13 DIAGNOSIS — C9000 Multiple myeloma not having achieved remission: Secondary | ICD-10-CM | POA: Diagnosis not present

## 2016-06-13 DIAGNOSIS — Z515 Encounter for palliative care: Secondary | ICD-10-CM | POA: Diagnosis not present

## 2016-06-13 DIAGNOSIS — M545 Low back pain: Secondary | ICD-10-CM | POA: Diagnosis not present

## 2016-06-13 DIAGNOSIS — R350 Frequency of micturition: Secondary | ICD-10-CM | POA: Diagnosis not present

## 2016-06-13 DIAGNOSIS — M4852XD Collapsed vertebra, not elsewhere classified, cervical region, subsequent encounter for fracture with routine healing: Secondary | ICD-10-CM | POA: Diagnosis not present

## 2016-06-13 DIAGNOSIS — Z5112 Encounter for antineoplastic immunotherapy: Secondary | ICD-10-CM | POA: Diagnosis not present

## 2016-06-13 DIAGNOSIS — G893 Neoplasm related pain (acute) (chronic): Secondary | ICD-10-CM | POA: Diagnosis not present

## 2016-06-15 DIAGNOSIS — C7951 Secondary malignant neoplasm of bone: Secondary | ICD-10-CM | POA: Diagnosis not present

## 2016-06-15 DIAGNOSIS — M545 Low back pain: Secondary | ICD-10-CM | POA: Diagnosis not present

## 2016-06-15 DIAGNOSIS — C9000 Multiple myeloma not having achieved remission: Secondary | ICD-10-CM | POA: Diagnosis not present

## 2016-06-16 DIAGNOSIS — C9000 Multiple myeloma not having achieved remission: Secondary | ICD-10-CM | POA: Diagnosis not present

## 2016-06-27 DIAGNOSIS — C9000 Multiple myeloma not having achieved remission: Secondary | ICD-10-CM | POA: Diagnosis not present

## 2016-06-30 DIAGNOSIS — C9000 Multiple myeloma not having achieved remission: Secondary | ICD-10-CM | POA: Diagnosis not present

## 2016-07-04 DIAGNOSIS — C9000 Multiple myeloma not having achieved remission: Secondary | ICD-10-CM | POA: Diagnosis not present

## 2016-07-07 DIAGNOSIS — C9000 Multiple myeloma not having achieved remission: Secondary | ICD-10-CM | POA: Diagnosis not present

## 2016-07-12 DIAGNOSIS — C9030 Solitary plasmacytoma not having achieved remission: Secondary | ICD-10-CM | POA: Diagnosis not present

## 2016-07-12 DIAGNOSIS — Z981 Arthrodesis status: Secondary | ICD-10-CM | POA: Diagnosis not present

## 2016-07-12 DIAGNOSIS — R221 Localized swelling, mass and lump, neck: Secondary | ICD-10-CM | POA: Diagnosis not present

## 2016-07-12 DIAGNOSIS — M8458XS Pathological fracture in neoplastic disease, other specified site, sequela: Secondary | ICD-10-CM | POA: Diagnosis not present

## 2016-07-18 DIAGNOSIS — Z5112 Encounter for antineoplastic immunotherapy: Secondary | ICD-10-CM | POA: Diagnosis not present

## 2016-07-18 DIAGNOSIS — C9000 Multiple myeloma not having achieved remission: Secondary | ICD-10-CM | POA: Diagnosis not present

## 2016-07-18 DIAGNOSIS — C9001 Multiple myeloma in remission: Secondary | ICD-10-CM | POA: Diagnosis not present

## 2016-07-21 DIAGNOSIS — C9000 Multiple myeloma not having achieved remission: Secondary | ICD-10-CM | POA: Diagnosis not present

## 2016-07-25 DIAGNOSIS — Z5112 Encounter for antineoplastic immunotherapy: Secondary | ICD-10-CM | POA: Diagnosis not present

## 2016-07-25 DIAGNOSIS — C9000 Multiple myeloma not having achieved remission: Secondary | ICD-10-CM | POA: Diagnosis not present

## 2016-07-28 DIAGNOSIS — C9001 Multiple myeloma in remission: Secondary | ICD-10-CM | POA: Diagnosis not present

## 2016-07-28 DIAGNOSIS — C9000 Multiple myeloma not having achieved remission: Secondary | ICD-10-CM | POA: Diagnosis not present

## 2016-08-15 DIAGNOSIS — Z79899 Other long term (current) drug therapy: Secondary | ICD-10-CM | POA: Diagnosis not present

## 2016-08-15 DIAGNOSIS — R21 Rash and other nonspecific skin eruption: Secondary | ICD-10-CM | POA: Diagnosis not present

## 2016-08-15 DIAGNOSIS — K5900 Constipation, unspecified: Secondary | ICD-10-CM | POA: Diagnosis not present

## 2016-08-15 DIAGNOSIS — Z8249 Family history of ischemic heart disease and other diseases of the circulatory system: Secondary | ICD-10-CM | POA: Diagnosis not present

## 2016-08-15 DIAGNOSIS — C9001 Multiple myeloma in remission: Secondary | ICD-10-CM | POA: Diagnosis not present

## 2016-08-15 DIAGNOSIS — Z8051 Family history of malignant neoplasm of kidney: Secondary | ICD-10-CM | POA: Diagnosis not present

## 2016-08-15 DIAGNOSIS — H9319 Tinnitus, unspecified ear: Secondary | ICD-10-CM | POA: Diagnosis not present

## 2016-08-15 DIAGNOSIS — Z833 Family history of diabetes mellitus: Secondary | ICD-10-CM | POA: Diagnosis not present

## 2016-08-15 DIAGNOSIS — M436 Torticollis: Secondary | ICD-10-CM | POA: Diagnosis not present

## 2016-08-15 DIAGNOSIS — Z5111 Encounter for antineoplastic chemotherapy: Secondary | ICD-10-CM | POA: Diagnosis not present

## 2016-08-15 DIAGNOSIS — R1310 Dysphagia, unspecified: Secondary | ICD-10-CM | POA: Diagnosis not present

## 2016-08-15 DIAGNOSIS — Z981 Arthrodesis status: Secondary | ICD-10-CM | POA: Diagnosis not present

## 2016-08-15 DIAGNOSIS — C9000 Multiple myeloma not having achieved remission: Secondary | ICD-10-CM | POA: Diagnosis not present

## 2016-08-15 DIAGNOSIS — M542 Cervicalgia: Secondary | ICD-10-CM | POA: Diagnosis not present

## 2016-08-17 DIAGNOSIS — C9000 Multiple myeloma not having achieved remission: Secondary | ICD-10-CM | POA: Diagnosis not present

## 2016-08-18 DIAGNOSIS — C9000 Multiple myeloma not having achieved remission: Secondary | ICD-10-CM | POA: Diagnosis not present

## 2016-08-19 DIAGNOSIS — C9000 Multiple myeloma not having achieved remission: Secondary | ICD-10-CM | POA: Diagnosis not present

## 2016-08-21 DIAGNOSIS — C9000 Multiple myeloma not having achieved remission: Secondary | ICD-10-CM | POA: Diagnosis not present

## 2016-08-22 DIAGNOSIS — C9000 Multiple myeloma not having achieved remission: Secondary | ICD-10-CM | POA: Diagnosis not present

## 2016-08-25 DIAGNOSIS — C9000 Multiple myeloma not having achieved remission: Secondary | ICD-10-CM | POA: Diagnosis not present

## 2016-08-29 DIAGNOSIS — C9001 Multiple myeloma in remission: Secondary | ICD-10-CM | POA: Diagnosis not present

## 2016-08-29 DIAGNOSIS — I517 Cardiomegaly: Secondary | ICD-10-CM | POA: Diagnosis not present

## 2016-08-29 DIAGNOSIS — C9000 Multiple myeloma not having achieved remission: Secondary | ICD-10-CM | POA: Diagnosis not present

## 2016-08-29 DIAGNOSIS — Z5111 Encounter for antineoplastic chemotherapy: Secondary | ICD-10-CM | POA: Diagnosis not present

## 2016-08-31 DIAGNOSIS — C9000 Multiple myeloma not having achieved remission: Secondary | ICD-10-CM | POA: Diagnosis not present

## 2016-08-31 DIAGNOSIS — C7951 Secondary malignant neoplasm of bone: Secondary | ICD-10-CM | POA: Diagnosis not present

## 2016-08-31 DIAGNOSIS — J9811 Atelectasis: Secondary | ICD-10-CM | POA: Diagnosis not present

## 2016-09-05 DIAGNOSIS — C9000 Multiple myeloma not having achieved remission: Secondary | ICD-10-CM | POA: Diagnosis not present

## 2016-09-07 DIAGNOSIS — C9000 Multiple myeloma not having achieved remission: Secondary | ICD-10-CM | POA: Diagnosis not present

## 2016-09-07 DIAGNOSIS — Z8579 Personal history of other malignant neoplasms of lymphoid, hematopoietic and related tissues: Secondary | ICD-10-CM | POA: Diagnosis not present

## 2016-09-07 DIAGNOSIS — C9001 Multiple myeloma in remission: Secondary | ICD-10-CM | POA: Diagnosis not present

## 2016-09-12 DIAGNOSIS — T451X5A Adverse effect of antineoplastic and immunosuppressive drugs, initial encounter: Secondary | ICD-10-CM | POA: Diagnosis not present

## 2016-09-12 DIAGNOSIS — C9000 Multiple myeloma not having achieved remission: Secondary | ICD-10-CM | POA: Diagnosis not present

## 2016-09-12 DIAGNOSIS — G620 Drug-induced polyneuropathy: Secondary | ICD-10-CM | POA: Diagnosis not present

## 2016-09-12 DIAGNOSIS — Z5111 Encounter for antineoplastic chemotherapy: Secondary | ICD-10-CM | POA: Diagnosis not present

## 2016-09-13 DIAGNOSIS — G620 Drug-induced polyneuropathy: Secondary | ICD-10-CM | POA: Diagnosis not present

## 2016-09-13 DIAGNOSIS — Z5111 Encounter for antineoplastic chemotherapy: Secondary | ICD-10-CM | POA: Diagnosis not present

## 2016-09-13 DIAGNOSIS — C9000 Multiple myeloma not having achieved remission: Secondary | ICD-10-CM | POA: Diagnosis not present

## 2016-09-14 DIAGNOSIS — G620 Drug-induced polyneuropathy: Secondary | ICD-10-CM | POA: Diagnosis not present

## 2016-09-14 DIAGNOSIS — Z5111 Encounter for antineoplastic chemotherapy: Secondary | ICD-10-CM | POA: Diagnosis not present

## 2016-09-14 DIAGNOSIS — C9000 Multiple myeloma not having achieved remission: Secondary | ICD-10-CM | POA: Diagnosis not present

## 2016-09-15 DIAGNOSIS — C9000 Multiple myeloma not having achieved remission: Secondary | ICD-10-CM | POA: Diagnosis not present

## 2016-09-15 DIAGNOSIS — G620 Drug-induced polyneuropathy: Secondary | ICD-10-CM | POA: Diagnosis not present

## 2016-09-15 DIAGNOSIS — Z5111 Encounter for antineoplastic chemotherapy: Secondary | ICD-10-CM | POA: Diagnosis not present

## 2016-09-16 ENCOUNTER — Other Ambulatory Visit: Payer: Self-pay | Admitting: Internal Medicine

## 2016-09-16 DIAGNOSIS — Z5111 Encounter for antineoplastic chemotherapy: Secondary | ICD-10-CM | POA: Diagnosis not present

## 2016-09-16 DIAGNOSIS — G620 Drug-induced polyneuropathy: Secondary | ICD-10-CM | POA: Diagnosis not present

## 2016-09-16 DIAGNOSIS — C9000 Multiple myeloma not having achieved remission: Secondary | ICD-10-CM

## 2016-09-18 DIAGNOSIS — C9000 Multiple myeloma not having achieved remission: Secondary | ICD-10-CM | POA: Diagnosis not present

## 2016-09-20 DIAGNOSIS — C9000 Multiple myeloma not having achieved remission: Secondary | ICD-10-CM | POA: Diagnosis not present

## 2016-09-22 DIAGNOSIS — Z7982 Long term (current) use of aspirin: Secondary | ICD-10-CM | POA: Diagnosis not present

## 2016-09-22 DIAGNOSIS — D709 Neutropenia, unspecified: Secondary | ICD-10-CM | POA: Diagnosis not present

## 2016-09-22 DIAGNOSIS — N182 Chronic kidney disease, stage 2 (mild): Secondary | ICD-10-CM | POA: Diagnosis not present

## 2016-09-22 DIAGNOSIS — D649 Anemia, unspecified: Secondary | ICD-10-CM | POA: Diagnosis not present

## 2016-09-22 DIAGNOSIS — Z79899 Other long term (current) drug therapy: Secondary | ICD-10-CM | POA: Diagnosis not present

## 2016-09-22 DIAGNOSIS — Z9481 Bone marrow transplant status: Secondary | ICD-10-CM | POA: Diagnosis not present

## 2016-09-22 DIAGNOSIS — Z981 Arthrodesis status: Secondary | ICD-10-CM | POA: Diagnosis not present

## 2016-09-22 DIAGNOSIS — Z8701 Personal history of pneumonia (recurrent): Secondary | ICD-10-CM | POA: Diagnosis not present

## 2016-09-22 DIAGNOSIS — Z87891 Personal history of nicotine dependence: Secondary | ICD-10-CM | POA: Diagnosis not present

## 2016-09-22 DIAGNOSIS — R5383 Other fatigue: Secondary | ICD-10-CM | POA: Diagnosis not present

## 2016-09-22 DIAGNOSIS — R5081 Fever presenting with conditions classified elsewhere: Secondary | ICD-10-CM | POA: Diagnosis not present

## 2016-09-22 DIAGNOSIS — R509 Fever, unspecified: Secondary | ICD-10-CM | POA: Diagnosis not present

## 2016-09-22 DIAGNOSIS — D72819 Decreased white blood cell count, unspecified: Secondary | ICD-10-CM | POA: Diagnosis not present

## 2016-09-22 DIAGNOSIS — C9000 Multiple myeloma not having achieved remission: Secondary | ICD-10-CM | POA: Diagnosis not present

## 2016-09-22 DIAGNOSIS — G629 Polyneuropathy, unspecified: Secondary | ICD-10-CM | POA: Diagnosis not present

## 2016-09-22 DIAGNOSIS — R1310 Dysphagia, unspecified: Secondary | ICD-10-CM | POA: Diagnosis not present

## 2016-09-23 ENCOUNTER — Other Ambulatory Visit: Payer: Self-pay | Admitting: Internal Medicine

## 2016-09-23 DIAGNOSIS — C9000 Multiple myeloma not having achieved remission: Secondary | ICD-10-CM

## 2016-09-24 DIAGNOSIS — R5383 Other fatigue: Secondary | ICD-10-CM | POA: Diagnosis not present

## 2016-09-24 DIAGNOSIS — R509 Fever, unspecified: Secondary | ICD-10-CM | POA: Diagnosis not present

## 2016-09-24 DIAGNOSIS — Z9481 Bone marrow transplant status: Secondary | ICD-10-CM | POA: Diagnosis not present

## 2016-09-25 DIAGNOSIS — D709 Neutropenia, unspecified: Secondary | ICD-10-CM | POA: Diagnosis not present

## 2016-09-25 DIAGNOSIS — R5081 Fever presenting with conditions classified elsewhere: Secondary | ICD-10-CM | POA: Diagnosis not present

## 2016-09-25 DIAGNOSIS — C9000 Multiple myeloma not having achieved remission: Secondary | ICD-10-CM | POA: Diagnosis not present

## 2016-09-26 DIAGNOSIS — C9000 Multiple myeloma not having achieved remission: Secondary | ICD-10-CM | POA: Diagnosis not present

## 2016-09-26 DIAGNOSIS — R5081 Fever presenting with conditions classified elsewhere: Secondary | ICD-10-CM | POA: Diagnosis not present

## 2016-09-26 DIAGNOSIS — D709 Neutropenia, unspecified: Secondary | ICD-10-CM | POA: Diagnosis not present

## 2016-09-26 DIAGNOSIS — D72819 Decreased white blood cell count, unspecified: Secondary | ICD-10-CM | POA: Diagnosis not present

## 2016-09-26 DIAGNOSIS — R509 Fever, unspecified: Secondary | ICD-10-CM | POA: Diagnosis not present

## 2016-09-27 DIAGNOSIS — C9000 Multiple myeloma not having achieved remission: Secondary | ICD-10-CM | POA: Diagnosis not present

## 2016-10-03 DIAGNOSIS — R1310 Dysphagia, unspecified: Secondary | ICD-10-CM | POA: Diagnosis not present

## 2016-10-03 DIAGNOSIS — G629 Polyneuropathy, unspecified: Secondary | ICD-10-CM | POA: Diagnosis not present

## 2016-10-03 DIAGNOSIS — Z9481 Bone marrow transplant status: Secondary | ICD-10-CM | POA: Diagnosis not present

## 2016-10-03 DIAGNOSIS — C9000 Multiple myeloma not having achieved remission: Secondary | ICD-10-CM | POA: Diagnosis not present

## 2016-10-03 DIAGNOSIS — R918 Other nonspecific abnormal finding of lung field: Secondary | ICD-10-CM | POA: Diagnosis not present

## 2016-10-03 DIAGNOSIS — I517 Cardiomegaly: Secondary | ICD-10-CM | POA: Diagnosis not present

## 2016-10-03 DIAGNOSIS — M8448XA Pathological fracture, other site, initial encounter for fracture: Secondary | ICD-10-CM | POA: Diagnosis not present

## 2016-10-03 DIAGNOSIS — Z923 Personal history of irradiation: Secondary | ICD-10-CM | POA: Diagnosis not present

## 2016-10-04 DIAGNOSIS — M954 Acquired deformity of chest and rib: Secondary | ICD-10-CM | POA: Diagnosis not present

## 2016-10-04 DIAGNOSIS — R110 Nausea: Secondary | ICD-10-CM | POA: Diagnosis not present

## 2016-10-04 DIAGNOSIS — R5081 Fever presenting with conditions classified elsewhere: Secondary | ICD-10-CM | POA: Diagnosis not present

## 2016-10-04 DIAGNOSIS — R509 Fever, unspecified: Secondary | ICD-10-CM | POA: Diagnosis not present

## 2016-10-04 DIAGNOSIS — Z9484 Stem cells transplant status: Secondary | ICD-10-CM | POA: Diagnosis not present

## 2016-10-04 DIAGNOSIS — Z9481 Bone marrow transplant status: Secondary | ICD-10-CM | POA: Diagnosis not present

## 2016-10-04 DIAGNOSIS — Z981 Arthrodesis status: Secondary | ICD-10-CM | POA: Diagnosis not present

## 2016-10-04 DIAGNOSIS — M8450XA Pathological fracture in neoplastic disease, unspecified site, initial encounter for fracture: Secondary | ICD-10-CM | POA: Diagnosis not present

## 2016-10-04 DIAGNOSIS — M79671 Pain in right foot: Secondary | ICD-10-CM | POA: Diagnosis not present

## 2016-10-04 DIAGNOSIS — C9000 Multiple myeloma not having achieved remission: Secondary | ICD-10-CM | POA: Diagnosis not present

## 2016-10-04 DIAGNOSIS — M532X9 Spinal instabilities, site unspecified: Secondary | ICD-10-CM | POA: Diagnosis not present

## 2016-10-04 DIAGNOSIS — R5383 Other fatigue: Secondary | ICD-10-CM | POA: Diagnosis not present

## 2016-10-04 DIAGNOSIS — E8770 Fluid overload, unspecified: Secondary | ICD-10-CM | POA: Diagnosis not present

## 2016-10-04 DIAGNOSIS — M47816 Spondylosis without myelopathy or radiculopathy, lumbar region: Secondary | ICD-10-CM | POA: Diagnosis not present

## 2016-10-04 DIAGNOSIS — M8458XS Pathological fracture in neoplastic disease, other specified site, sequela: Secondary | ICD-10-CM | POA: Diagnosis not present

## 2016-10-04 DIAGNOSIS — C9030 Solitary plasmacytoma not having achieved remission: Secondary | ICD-10-CM | POA: Diagnosis not present

## 2016-10-04 DIAGNOSIS — R197 Diarrhea, unspecified: Secondary | ICD-10-CM | POA: Diagnosis not present

## 2016-10-04 DIAGNOSIS — M4852XA Collapsed vertebra, not elsewhere classified, cervical region, initial encounter for fracture: Secondary | ICD-10-CM | POA: Diagnosis not present

## 2016-10-04 DIAGNOSIS — M7989 Other specified soft tissue disorders: Secondary | ICD-10-CM | POA: Diagnosis not present

## 2016-10-04 DIAGNOSIS — J40 Bronchitis, not specified as acute or chronic: Secondary | ICD-10-CM | POA: Diagnosis not present

## 2016-10-04 DIAGNOSIS — D61810 Antineoplastic chemotherapy induced pancytopenia: Secondary | ICD-10-CM | POA: Diagnosis not present

## 2016-10-04 DIAGNOSIS — M722 Plantar fascial fibromatosis: Secondary | ICD-10-CM | POA: Diagnosis not present

## 2016-10-04 DIAGNOSIS — D709 Neutropenia, unspecified: Secondary | ICD-10-CM | POA: Diagnosis not present

## 2016-10-04 DIAGNOSIS — G620 Drug-induced polyneuropathy: Secondary | ICD-10-CM | POA: Diagnosis not present

## 2016-10-04 DIAGNOSIS — G8929 Other chronic pain: Secondary | ICD-10-CM | POA: Diagnosis not present

## 2016-10-11 DIAGNOSIS — R5383 Other fatigue: Secondary | ICD-10-CM | POA: Diagnosis not present

## 2016-10-11 DIAGNOSIS — M532X9 Spinal instabilities, site unspecified: Secondary | ICD-10-CM | POA: Diagnosis not present

## 2016-10-11 DIAGNOSIS — R110 Nausea: Secondary | ICD-10-CM | POA: Diagnosis not present

## 2016-10-11 DIAGNOSIS — Z981 Arthrodesis status: Secondary | ICD-10-CM | POA: Diagnosis not present

## 2016-10-11 DIAGNOSIS — C9030 Solitary plasmacytoma not having achieved remission: Secondary | ICD-10-CM | POA: Diagnosis not present

## 2016-10-11 DIAGNOSIS — M47816 Spondylosis without myelopathy or radiculopathy, lumbar region: Secondary | ICD-10-CM | POA: Diagnosis not present

## 2016-10-11 DIAGNOSIS — M4852XA Collapsed vertebra, not elsewhere classified, cervical region, initial encounter for fracture: Secondary | ICD-10-CM | POA: Diagnosis not present

## 2016-10-11 DIAGNOSIS — M8450XA Pathological fracture in neoplastic disease, unspecified site, initial encounter for fracture: Secondary | ICD-10-CM | POA: Diagnosis not present

## 2016-10-11 DIAGNOSIS — M8458XS Pathological fracture in neoplastic disease, other specified site, sequela: Secondary | ICD-10-CM | POA: Diagnosis not present

## 2016-10-18 DIAGNOSIS — D709 Neutropenia, unspecified: Secondary | ICD-10-CM | POA: Diagnosis not present

## 2016-10-18 DIAGNOSIS — Z9481 Bone marrow transplant status: Secondary | ICD-10-CM | POA: Diagnosis not present

## 2016-10-18 DIAGNOSIS — C9000 Multiple myeloma not having achieved remission: Secondary | ICD-10-CM | POA: Diagnosis not present

## 2016-10-18 DIAGNOSIS — R5081 Fever presenting with conditions classified elsewhere: Secondary | ICD-10-CM | POA: Diagnosis not present

## 2016-10-18 DIAGNOSIS — M954 Acquired deformity of chest and rib: Secondary | ICD-10-CM | POA: Diagnosis not present

## 2016-10-19 DIAGNOSIS — R5081 Fever presenting with conditions classified elsewhere: Secondary | ICD-10-CM | POA: Diagnosis not present

## 2016-10-19 DIAGNOSIS — D709 Neutropenia, unspecified: Secondary | ICD-10-CM | POA: Diagnosis not present

## 2016-10-19 DIAGNOSIS — Z9481 Bone marrow transplant status: Secondary | ICD-10-CM | POA: Diagnosis not present

## 2016-10-19 DIAGNOSIS — C9000 Multiple myeloma not having achieved remission: Secondary | ICD-10-CM | POA: Diagnosis not present

## 2016-10-20 DIAGNOSIS — Z9481 Bone marrow transplant status: Secondary | ICD-10-CM | POA: Diagnosis not present

## 2016-10-20 DIAGNOSIS — R5081 Fever presenting with conditions classified elsewhere: Secondary | ICD-10-CM | POA: Diagnosis not present

## 2016-10-20 DIAGNOSIS — C9000 Multiple myeloma not having achieved remission: Secondary | ICD-10-CM | POA: Diagnosis not present

## 2016-10-20 DIAGNOSIS — D61810 Antineoplastic chemotherapy induced pancytopenia: Secondary | ICD-10-CM | POA: Diagnosis not present

## 2016-10-20 DIAGNOSIS — D709 Neutropenia, unspecified: Secondary | ICD-10-CM | POA: Diagnosis not present

## 2016-10-21 DIAGNOSIS — Z9481 Bone marrow transplant status: Secondary | ICD-10-CM | POA: Diagnosis not present

## 2016-10-21 DIAGNOSIS — D709 Neutropenia, unspecified: Secondary | ICD-10-CM | POA: Diagnosis not present

## 2016-10-21 DIAGNOSIS — M79671 Pain in right foot: Secondary | ICD-10-CM | POA: Diagnosis not present

## 2016-10-21 DIAGNOSIS — R5081 Fever presenting with conditions classified elsewhere: Secondary | ICD-10-CM | POA: Diagnosis not present

## 2016-10-21 DIAGNOSIS — C9000 Multiple myeloma not having achieved remission: Secondary | ICD-10-CM | POA: Diagnosis not present

## 2016-10-22 DIAGNOSIS — D709 Neutropenia, unspecified: Secondary | ICD-10-CM | POA: Diagnosis not present

## 2016-10-22 DIAGNOSIS — Z9481 Bone marrow transplant status: Secondary | ICD-10-CM | POA: Diagnosis not present

## 2016-10-22 DIAGNOSIS — R5081 Fever presenting with conditions classified elsewhere: Secondary | ICD-10-CM | POA: Diagnosis not present

## 2016-10-22 DIAGNOSIS — C9000 Multiple myeloma not having achieved remission: Secondary | ICD-10-CM | POA: Diagnosis not present

## 2016-10-23 DIAGNOSIS — M79671 Pain in right foot: Secondary | ICD-10-CM | POA: Diagnosis not present

## 2016-10-23 DIAGNOSIS — G620 Drug-induced polyneuropathy: Secondary | ICD-10-CM | POA: Diagnosis not present

## 2016-10-23 DIAGNOSIS — Z9481 Bone marrow transplant status: Secondary | ICD-10-CM | POA: Diagnosis not present

## 2016-10-23 DIAGNOSIS — C9000 Multiple myeloma not having achieved remission: Secondary | ICD-10-CM | POA: Diagnosis not present

## 2016-10-23 DIAGNOSIS — J40 Bronchitis, not specified as acute or chronic: Secondary | ICD-10-CM | POA: Diagnosis not present

## 2016-10-23 DIAGNOSIS — M7989 Other specified soft tissue disorders: Secondary | ICD-10-CM | POA: Diagnosis not present

## 2016-10-30 DIAGNOSIS — Z9481 Bone marrow transplant status: Secondary | ICD-10-CM | POA: Diagnosis not present

## 2016-10-30 DIAGNOSIS — Z452 Encounter for adjustment and management of vascular access device: Secondary | ICD-10-CM | POA: Diagnosis not present

## 2016-11-14 DIAGNOSIS — C9000 Multiple myeloma not having achieved remission: Secondary | ICD-10-CM | POA: Diagnosis not present

## 2016-11-14 DIAGNOSIS — Z9484 Stem cells transplant status: Secondary | ICD-10-CM | POA: Diagnosis not present

## 2016-11-14 DIAGNOSIS — C9001 Multiple myeloma in remission: Secondary | ICD-10-CM | POA: Diagnosis not present

## 2016-11-28 DIAGNOSIS — Z79899 Other long term (current) drug therapy: Secondary | ICD-10-CM | POA: Diagnosis not present

## 2016-11-28 DIAGNOSIS — Z9484 Stem cells transplant status: Secondary | ICD-10-CM | POA: Diagnosis not present

## 2016-11-28 DIAGNOSIS — R0602 Shortness of breath: Secondary | ICD-10-CM | POA: Diagnosis not present

## 2016-11-28 DIAGNOSIS — C9000 Multiple myeloma not having achieved remission: Secondary | ICD-10-CM | POA: Diagnosis not present

## 2016-11-28 DIAGNOSIS — C9001 Multiple myeloma in remission: Secondary | ICD-10-CM | POA: Diagnosis not present

## 2016-11-28 DIAGNOSIS — G629 Polyneuropathy, unspecified: Secondary | ICD-10-CM | POA: Diagnosis not present

## 2016-11-28 DIAGNOSIS — Z9481 Bone marrow transplant status: Secondary | ICD-10-CM | POA: Diagnosis not present

## 2016-12-19 DIAGNOSIS — Z9484 Stem cells transplant status: Secondary | ICD-10-CM | POA: Diagnosis not present

## 2016-12-19 DIAGNOSIS — Z9481 Bone marrow transplant status: Secondary | ICD-10-CM | POA: Diagnosis not present

## 2016-12-19 DIAGNOSIS — C9000 Multiple myeloma not having achieved remission: Secondary | ICD-10-CM | POA: Diagnosis not present

## 2016-12-27 DIAGNOSIS — C9001 Multiple myeloma in remission: Secondary | ICD-10-CM | POA: Diagnosis not present

## 2016-12-27 DIAGNOSIS — Z8579 Personal history of other malignant neoplasms of lymphoid, hematopoietic and related tissues: Secondary | ICD-10-CM | POA: Diagnosis not present

## 2016-12-27 DIAGNOSIS — C9000 Multiple myeloma not having achieved remission: Secondary | ICD-10-CM | POA: Diagnosis not present

## 2016-12-28 DIAGNOSIS — C9000 Multiple myeloma not having achieved remission: Secondary | ICD-10-CM | POA: Diagnosis not present

## 2017-01-02 DIAGNOSIS — G629 Polyneuropathy, unspecified: Secondary | ICD-10-CM | POA: Diagnosis not present

## 2017-01-02 DIAGNOSIS — Z9484 Stem cells transplant status: Secondary | ICD-10-CM | POA: Diagnosis not present

## 2017-01-02 DIAGNOSIS — Z9481 Bone marrow transplant status: Secondary | ICD-10-CM | POA: Diagnosis not present

## 2017-01-02 DIAGNOSIS — C9001 Multiple myeloma in remission: Secondary | ICD-10-CM | POA: Diagnosis not present

## 2017-01-02 DIAGNOSIS — C9000 Multiple myeloma not having achieved remission: Secondary | ICD-10-CM | POA: Diagnosis not present

## 2017-01-23 DIAGNOSIS — C9001 Multiple myeloma in remission: Secondary | ICD-10-CM | POA: Diagnosis not present

## 2017-01-23 DIAGNOSIS — Z7982 Long term (current) use of aspirin: Secondary | ICD-10-CM | POA: Diagnosis not present

## 2017-01-23 DIAGNOSIS — G629 Polyneuropathy, unspecified: Secondary | ICD-10-CM | POA: Diagnosis not present

## 2017-01-23 DIAGNOSIS — Z9481 Bone marrow transplant status: Secondary | ICD-10-CM | POA: Diagnosis not present

## 2017-01-23 DIAGNOSIS — C9000 Multiple myeloma not having achieved remission: Secondary | ICD-10-CM | POA: Diagnosis not present

## 2017-01-23 DIAGNOSIS — Z9484 Stem cells transplant status: Secondary | ICD-10-CM | POA: Diagnosis not present

## 2017-02-20 DIAGNOSIS — C9001 Multiple myeloma in remission: Secondary | ICD-10-CM | POA: Diagnosis not present

## 2017-03-20 DIAGNOSIS — C9001 Multiple myeloma in remission: Secondary | ICD-10-CM | POA: Diagnosis not present

## 2017-04-17 DIAGNOSIS — Z23 Encounter for immunization: Secondary | ICD-10-CM | POA: Diagnosis not present

## 2017-04-17 DIAGNOSIS — G629 Polyneuropathy, unspecified: Secondary | ICD-10-CM | POA: Diagnosis not present

## 2017-04-17 DIAGNOSIS — C9001 Multiple myeloma in remission: Secondary | ICD-10-CM | POA: Diagnosis not present

## 2017-05-15 DIAGNOSIS — C9001 Multiple myeloma in remission: Secondary | ICD-10-CM | POA: Diagnosis not present

## 2017-06-12 DIAGNOSIS — C9001 Multiple myeloma in remission: Secondary | ICD-10-CM | POA: Diagnosis not present

## 2017-07-10 DIAGNOSIS — G629 Polyneuropathy, unspecified: Secondary | ICD-10-CM | POA: Diagnosis not present

## 2017-07-10 DIAGNOSIS — Z79899 Other long term (current) drug therapy: Secondary | ICD-10-CM | POA: Diagnosis not present

## 2017-07-10 DIAGNOSIS — Z9484 Stem cells transplant status: Secondary | ICD-10-CM | POA: Diagnosis not present

## 2017-07-10 DIAGNOSIS — Z9181 History of falling: Secondary | ICD-10-CM | POA: Diagnosis not present

## 2017-07-10 DIAGNOSIS — C9001 Multiple myeloma in remission: Secondary | ICD-10-CM | POA: Diagnosis not present

## 2017-08-09 DIAGNOSIS — R05 Cough: Secondary | ICD-10-CM | POA: Diagnosis not present

## 2017-08-09 DIAGNOSIS — C9001 Multiple myeloma in remission: Secondary | ICD-10-CM | POA: Diagnosis not present

## 2017-08-09 DIAGNOSIS — J209 Acute bronchitis, unspecified: Secondary | ICD-10-CM | POA: Diagnosis not present

## 2017-08-09 DIAGNOSIS — C969 Malignant neoplasm of lymphoid, hematopoietic and related tissue, unspecified: Secondary | ICD-10-CM | POA: Diagnosis not present

## 2017-08-17 ENCOUNTER — Ambulatory Visit: Payer: BLUE CROSS/BLUE SHIELD | Admitting: Family Medicine

## 2017-09-04 DIAGNOSIS — C9001 Multiple myeloma in remission: Secondary | ICD-10-CM | POA: Diagnosis not present

## 2017-09-07 ENCOUNTER — Encounter: Payer: Self-pay | Admitting: Family Medicine

## 2017-09-07 ENCOUNTER — Ambulatory Visit (INDEPENDENT_AMBULATORY_CARE_PROVIDER_SITE_OTHER): Payer: BLUE CROSS/BLUE SHIELD | Admitting: Family Medicine

## 2017-09-07 VITALS — BP 115/83 | HR 72 | Temp 98.5°F | Resp 16 | Ht 72.0 in | Wt 276.0 lb

## 2017-09-07 DIAGNOSIS — J019 Acute sinusitis, unspecified: Secondary | ICD-10-CM

## 2017-09-07 DIAGNOSIS — N138 Other obstructive and reflux uropathy: Secondary | ICD-10-CM

## 2017-09-07 DIAGNOSIS — N401 Enlarged prostate with lower urinary tract symptoms: Secondary | ICD-10-CM | POA: Diagnosis not present

## 2017-09-07 DIAGNOSIS — C9000 Multiple myeloma not having achieved remission: Secondary | ICD-10-CM

## 2017-09-07 DIAGNOSIS — J069 Acute upper respiratory infection, unspecified: Secondary | ICD-10-CM

## 2017-09-07 DIAGNOSIS — Z23 Encounter for immunization: Secondary | ICD-10-CM | POA: Diagnosis not present

## 2017-09-07 DIAGNOSIS — Z7689 Persons encountering health services in other specified circumstances: Secondary | ICD-10-CM | POA: Diagnosis not present

## 2017-09-07 DIAGNOSIS — Z1211 Encounter for screening for malignant neoplasm of colon: Secondary | ICD-10-CM | POA: Diagnosis not present

## 2017-09-07 DIAGNOSIS — M25511 Pain in right shoulder: Secondary | ICD-10-CM

## 2017-09-07 MED ORDER — IPRATROPIUM BROMIDE 0.06 % NA SOLN: 2.0000 | Freq: Four times a day (QID) | NASAL | 0 refills | Status: DC

## 2017-09-07 MED ORDER — SILODOSIN 4 MG PO CAPS: 4.0000 mg | ORAL_CAPSULE | Freq: Every day | ORAL | 5 refills | Status: DC

## 2017-09-07 MED ORDER — BENZONATATE 100 MG PO CAPS: 100.0000 mg | ORAL_CAPSULE | Freq: Three times a day (TID) | ORAL | 0 refills | Status: DC | PRN

## 2017-09-07 NOTE — Progress Notes (Signed)
Subjective:    Patient ID: Kyle Parker, male    DOB: 1956-02-21, 62 y.o.   MRN: [id number]  Kyle Parker is a 62 y.o. male presenting on 09/07/2017 for Establish Care (cancer, shoulder pain obtw cough onset 2-3 week)  Previously followed by Dr Luan Pulling here at [facility] Last visit 2017. Here to reestablish with new PCP.  HPI   Multiple Myeloma Followed by [facility]. Initial dx Since 2017 August, he has been in remission until recently had concern with abnormal labs from [place] now requested PET Scan, Bone Marrow, pending.  General History: - He was asked to establish with PCP prior to further testing, requested other routine screening colonoscopy, blood work and flu shot - No personal history of DM, HTN, HLD. Fam history of DM in mother  R Shoulder Pain Prior history of bursitis in past, was more severe in past. Now 2.5 weeks mild soreness in shoulder only if certain movements away from body and lifting, seems gradually improved. Not taking medicines for it. Denies injury or trauma  BPH He was treated with Flomax in past, did well unsure why stopped. Would like to reconsider a med for BPH. Last PSA 0.36, in 2017.  AUA BPH Symptom Score over past 1 month 1. Sensation of not emptying bladder post void - 0 2. Urinate less than 2 hour after finish last void - 3 3. Start/Stop several times during void - 0 4. Difficult to postpone urination - 1 5. Weak urinary stream - 0 6. Push or strain urination - 0 7. Nocturia - 2-3 times  Total Score: 6-7 (Mild BPH symptoms)   URI Reports some recent URI symptoms, was worse now seems improved, still lingering congestion and some sneezing drainage.  Health Maintenance: Due for Flu Shot, considered today - will return next week due to residual URI symptoms, afebrile, will get with labs  Colon CA Screening: Never had colonoscopy. Currently asymptomatic. No known family history of colon CA. Due for screening test age >42, also requested by  [place] with history of MM. Waiting for patient request for new referral to GI - local options given   Depression screen [facility] 2/9 09/07/2017 03/07/2016  Decreased Interest 0 0  Down, Depressed, Hopeless 0 0  PHQ - 2 Score 0 0    Past Medical History:  Diagnosis Date  . Arthritis   . Compression fracture of cervical spine at C2-C3 level   . Lytic bone lesions on xray   . Multiple myeloma without remission [facility])    Past Surgical History:  Procedure Laterality Date  . biopsy of the vertebral body     . CT GUIDED BONE BIOPSY  03/23/2016  . NECK SURGERY     Social History   Socioeconomic History  . Marital status: Married    Spouse name: Not on file  . Number of children: Not on file  . Years of education: Not on file  . Highest education level: Not on file  Social Needs  . Financial resource strain: Not on file  . Food insecurity - worry: Not on file  . Food insecurity - inability: Not on file  . Transportation needs - medical: Not on file  . Transportation needs - non-medical: Not on file  Occupational History  . Not on file  Tobacco Use  . Smoking status: Never Smoker  . Smokeless tobacco: Current User    Types: Chew  Substance and Sexual Activity  . Alcohol use: No  Alcohol/week: 0.0 oz  . Drug use: No  . Sexual activity: Not on file  Other Topics Concern  . Not on file  Social History Narrative  . Not on file   Family History  Problem Relation Age of Onset  . Heart attack Father   . Renal cancer Father   . CAD Mother   . Diabetes Mellitus II Mother   . Heart disease Mother   . Diabetes Mother   . Skin cancer Mother        non melanoma  . Prostate cancer Neg Hx   . Colon cancer Neg Hx    Current Outpatient Medications on File Prior to Visit  Medication Sig  . acyclovir (ZOVIRAX) 400 MG tablet Take 1 tablet (400 mg total) by mouth 2 (two) times daily.  Marland Kitchen gabapentin (NEURONTIN) 300 MG capsule Take by mouth.  . lenalidomide (REVLIMID) 25 MG capsule  Take one capsule daily on days 1-14 every 21 days.  Marland Kitchen aspirin EC 81 MG tablet Take by mouth.  Marland Kitchen PROAIR HFA 108 (90 Base) MCG/ACT inhaler   . Vitamin D, Ergocalciferol, (DRISDOL) 50000 units CAPS capsule Take by mouth.   No current facility-administered medications on file prior to visit.     Review of Systems  Constitutional: Negative for activity change, appetite change, chills, diaphoresis, fatigue and fever.  HENT: Negative for congestion and hearing loss.   Eyes: Negative for visual disturbance.  Respiratory: Negative for apnea, cough, choking, chest tightness, shortness of breath and wheezing.   Cardiovascular: Negative for chest pain, palpitations and leg swelling.  Gastrointestinal: Negative for abdominal pain, anal bleeding, blood in stool, constipation, diarrhea, nausea and vomiting.  Endocrine: Negative for cold intolerance and polyuria.  Genitourinary: Positive for urgency. Negative for decreased urine volume, dysuria, frequency, hematuria, scrotal swelling and testicular pain.       Nocturia  Musculoskeletal: Positive for arthralgias. Negative for back pain and neck pain.  Skin: Negative for rash.  Allergic/Immunologic: Negative for environmental allergies.  Neurological: Negative for dizziness, weakness, light-headedness, numbness and headaches.  Hematological: Negative for adenopathy.  Psychiatric/Behavioral: Negative for behavioral problems, dysphoric mood and sleep disturbance. The patient is not nervous/anxious.    Per HPI unless specifically indicated above     Objective:    BP 115/83   Pulse 72   Temp 98.5 F (36.9 C) (Other (Comment))   Resp 16   Ht 6' (1.829 m)   Wt 276 lb (125.2 kg)   SpO2 99%   BMI 37.43 kg/m   Wt Readings from Last 3 Encounters:  09/07/17 276 lb (125.2 kg)  04/11/16 297 lb 4.6 oz (134.8 kg)  03/28/16 293 lb 3.4 oz (133 kg)    Physical Exam  Constitutional: He is oriented to person, place, and time. He appears well-developed and  well-nourished. No distress.  Well-appearing, comfortable, cooperative  HENT:  Head: Normocephalic and atraumatic.  Mouth/Throat: Oropharynx is clear and moist.  Frontal / maxillary sinuses non-tender. Nares with mild deeper turbinate edema with some congestion without purulence. Bilateral TMs clear without erythema, effusion or bulging. Oropharynx clear without erythema, exudates, edema or asymmetry.  Eyes: Conjunctivae are normal. Right eye exhibits no discharge. Left eye exhibits no discharge.  Neck: Normal range of motion. Neck supple.  Cardiovascular: Normal rate, regular rhythm, normal heart sounds and intact distal pulses.  No murmur heard. Pulmonary/Chest: Effort normal and breath sounds normal. No respiratory distress. He has no wheezes. He has no rales.  Musculoskeletal: He exhibits no edema.  Right Shoulder Inspection: Normal appearance bilateral symmetrical Palpation: Non-tender to palpation over anterior, lateral, or posterior shoulder  ROM: Slightly limited abduction extended out from body due to discomfort only at certain point in ROM, otherwise intact, otherwise normal intact active ROM forward flexion, internal / external rotation, symmetrical Special Testing: Rotator cuff testing negative for weakness with supraspinatus full can and empty can test, Hawkin's AC impingement negative for pain Strength: Normal strength 5/5 flex/ext, ext rot / int rot, grip, rotator cuff str testing. Neurovascular: Distally intact pulses, sensation to light touch  Neurological: He is alert and oriented to person, place, and time.  Skin: Skin is warm and dry. No rash noted. He is not diaphoretic. No erythema.  Psychiatric: He has a normal mood and affect. His behavior is normal.  Well groomed, good eye contact, normal speech and thoughts  Nursing note and vitals reviewed.  Results for orders placed or performed in visit on 09/07/17  HIV antibody  Result Value Ref Range   HIV 1&2 Ab, 4th  Generation Non-reactive       Assessment & Plan:   Problem List Items Addressed This Visit    BPH with obstruction/lower urinary tract symptoms - Primary    Stable chronic BPH with some lower urinary tract symptoms (LUTS) w/o any evidence of obstruction. - AUA BPH score 6-7 (mild) - Not on med - past had good results on Flomax since off - Last PSA 0.3 negative (2017) - Last DRE reported normal - No known personal/family history of prostate CA  Plan: 1. Start Silodosin17m daily, advised on benefits, risks, if BP low caution with sudden standing up or position change 2. Follow-up as needed consider Urology in future  Note future colonoscopy will eval prostate as well and visualize      Relevant Medications   silodosin (RAPAFLO) 4 MG CAPS capsule   Multiple myeloma without remission ([place]    [facility] Recent concern with remission abnormal labs Pending further work-up PET, BM Bx Requested establish PCP and initial screening other labs      Relevant Medications   aspirin EC 81 MG tablet   Screening for colon cancer    Due for routine colon cancer screening. Never had colonoscopy - interested but not ready to schedule yet - Discussion today about recommendations for either Colonoscopy or Cologuard screening, benefits and risks of screening - Handout given, will notify office when ready for referral to local GI - either [facility] or [place] then arrange colonoscopy       Other Visit Diagnoses    Encounter to establish care with new doctor       Viral URI       Relevant Medications   benzonatate (TESSALON) 100 MG capsule   Acute rhinosinusitis       Mild symptoms, seems improving, trial on symptom relief tessalon and atrovent nasal, follow-up if not improve, defer flu shot until return with labs   Relevant Medications   ipratropium (ATROVENT) 0.06 % nasal spray   benzonatate (TESSALON) 100 MG capsule   Need for diphtheria-tetanus-pertussis (Tdap) vaccine        Relevant Orders   Tdap vaccine greater than or equal to 7yo IM (Completed)   Acute pain of right shoulder       Consistent with likely bursitis R shoulder vs localized tendinopathy of rotator cuff given distinct movement with pain, conservative care, exercises ROM, follow      Meds ordered this encounter  Medications  .  silodosin (RAPAFLO) 4 MG CAPS capsule    Sig: Take 1 capsule (4 mg total) by mouth daily with breakfast.    Dispense:  30 capsule    Refill:  5  . ipratropium (ATROVENT) 0.06 % nasal spray    Sig: Place 2 sprays into both nostrils 4 (four) times daily. For up to 5-7 days then stop.    Dispense:  15 mL    Refill:  0  . benzonatate (TESSALON) 100 MG capsule    Sig: Take 1 capsule (100 mg total) by mouth 3 (three) times daily as needed for cough.    Dispense:  30 capsule    Refill:  0    Follow up plan: Return in about 6 weeks (around 10/19/2017) for Lab review, BPH med adjust, R Shoulder bursitis f/u.   Future labs ordered for 10/2017  Nobie Putnam, [facility] 09/08/2017, 1:11 AM

## 2017-09-07 NOTE — Patient Instructions (Addendum)
Thank you for coming to the office today.  1.  R shoulder bursitis  Recommend trial of Anti-inflammatory with Aleve OTC 250mg  tabs - take one with food and plenty of water TWICE daily every day (breakfast and dinner), for next 1-2 weeks, then you may take only as needed - DO NOT TAKE any ibuprofen, motrin while you are taking this medicine - It is safe to take Tylenol Ext Str 500mg  tabs - take 1 to 2 (max dose 1000mg ) every 6 hours as needed for breakthrough pain, max 24 hour daily dose is 6 to 8 tablets or 4000mg   Try heating pad or ice packs as needed  Improve range of motion.  Avoid heavy lifting arm away from body   URI Start Atrovent nasal spray decongestant 2 sprays in each nostril up to 4 times daily for 7 days  Start Tessalon Perls take 1 capsule up to 3 times a day as needed for cough  SCHEDULE NURSE VISIT NEXT WEEK FOR - Flu Shot   Prostate - BPH - take Silodosin (Rapaflo) once daily every day for urinary symptoms  3.  GASTROENTEROLOGY (GI)  [facility] [address], [place] [address] Phone: [phone]  [facility]) [address], [place] [address] Main: [phone]  ----------------------------------------------------------  [place] [place], [place] [address] Hours: 8AM-5PM Phone: [phone]  DUE for FASTING BLOOD WORK (no food or drink after midnight before the lab appointment, only water or coffee without cream/sugar on the morning of)  SCHEDULE "Lab Only" visit in the morning at the clinic for lab draw in 1-2 WEEKS   - Make sure Lab Only appointment is at about 1 week before your next appointment, so that results will be available  For Lab Results, once available within 2-3 days of blood draw, you can can log in to MyChart online to view your results and a brief explanation. Also, we can discuss results at next follow-up  visit.   Please schedule a Follow-up Appointment to: Return in about 6 weeks (around 10/19/2017) for Lab review, BPH med adjust, R Shoulder bursitis f/u.    If you have any other questions or concerns, please feel free to call the office or send a message through [place]. You may also schedule an earlier appointment if necessary.  Additionally, you may be receiving a survey about your experience at our office within a few days to 1 week by e-mail or mail. We value your feedback.  Nobie Putnam, DO [facility], [facility]  Range of Motion Shoulder Exercises  [place] with your good arm against a counter or table for support [facility] forward with a wide stance (make sure your body is comfortable) - Your painful shoulder should hang down and feel "heavy" - Gently move your painful arm in small circles "clockwise" for several turns - Switch to "counterclockwise" for several turns - Early on keep circles narrow and move slowly - Later in rehab, move in larger circles and faster movement   Wall Crawl - Stand close (about 1-2 ft away) to a wall, facing it directly - Reach out with your arm of painful shoulder and place fingers (not palm) on wall - You should make contact with wall at your waist level - Slowly walk your fingers up the wall. Stay in contact with wall entire time, do not remove fingers - Keep walking fingers up wall until  you reach shoulder level - You may feel tightening or mild discomfort, once you reach a height that causes pain or if you are already above your shoulder height then stop. Repeat from starting position. - Early on stand closer to wall, move fingers slowly, and stay at or below shoulder level - Later in rehab, stand farther away from wall (fingertips), move fingers quicker, go above shoulder level

## 2017-09-08 ENCOUNTER — Encounter: Payer: Self-pay | Admitting: Family Medicine

## 2017-09-08 ENCOUNTER — Other Ambulatory Visit: Payer: Self-pay | Admitting: Family Medicine

## 2017-09-08 DIAGNOSIS — R799 Abnormal finding of blood chemistry, unspecified: Secondary | ICD-10-CM

## 2017-09-08 DIAGNOSIS — N138 Other obstructive and reflux uropathy: Secondary | ICD-10-CM

## 2017-09-08 DIAGNOSIS — Z125 Encounter for screening for malignant neoplasm of prostate: Secondary | ICD-10-CM

## 2017-09-08 DIAGNOSIS — Z1159 Encounter for screening for other viral diseases: Secondary | ICD-10-CM

## 2017-09-08 DIAGNOSIS — Z1211 Encounter for screening for malignant neoplasm of colon: Secondary | ICD-10-CM | POA: Insufficient documentation

## 2017-09-08 DIAGNOSIS — C9000 Multiple myeloma not having achieved remission: Secondary | ICD-10-CM

## 2017-09-08 DIAGNOSIS — Z0000 Encounter for general adult medical examination without abnormal findings: Secondary | ICD-10-CM

## 2017-09-08 DIAGNOSIS — N401 Enlarged prostate with lower urinary tract symptoms: Secondary | ICD-10-CM

## 2017-09-08 NOTE — Assessment & Plan Note (Signed)
Due for routine colon cancer screening. Never had colonoscopy - interested but not ready to schedule yet - Discussion today about recommendations for either Colonoscopy or Cologuard screening, benefits and risks of screening - Handout given, will notify office when ready for referral to local GI - either [facility] or [place] and then arrange colonoscopy

## 2017-09-08 NOTE — Assessment & Plan Note (Signed)
Followed by [facility] Recent concern with remission abnormal labs Pending further work-up PET, BM Bx Requested establish PCP and initial screening other labs

## 2017-09-08 NOTE — Assessment & Plan Note (Addendum)
Stable chronic BPH with some lower urinary tract symptoms (LUTS) w/o any evidence of obstruction. - AUA BPH score 6-7 (mild) - Not on med - past had good results on Flomax since off - Last PSA 0.3 negative (2017) - Last DRE reported normal - No known personal/family history of prostate CA  Plan: 1. Start Silodosin4mg  daily, advised on benefits, risks, if BP low caution with sudden standing up or position change 2. Follow-up as needed consider Urology in future  Note future colonoscopy will eval prostate as well and visualize

## 2017-09-13 ENCOUNTER — Other Ambulatory Visit (INDEPENDENT_AMBULATORY_CARE_PROVIDER_SITE_OTHER): Payer: BLUE CROSS/BLUE SHIELD

## 2017-09-13 DIAGNOSIS — R7309 Other abnormal glucose: Secondary | ICD-10-CM | POA: Diagnosis not present

## 2017-09-13 DIAGNOSIS — Z125 Encounter for screening for malignant neoplasm of prostate: Secondary | ICD-10-CM | POA: Diagnosis not present

## 2017-09-13 DIAGNOSIS — Z23 Encounter for immunization: Secondary | ICD-10-CM

## 2017-09-13 DIAGNOSIS — Z0000 Encounter for general adult medical examination without abnormal findings: Secondary | ICD-10-CM

## 2017-09-13 DIAGNOSIS — R799 Abnormal finding of blood chemistry, unspecified: Secondary | ICD-10-CM | POA: Diagnosis not present

## 2017-09-13 DIAGNOSIS — N401 Enlarged prostate with lower urinary tract symptoms: Secondary | ICD-10-CM

## 2017-09-13 DIAGNOSIS — C9000 Multiple myeloma not having achieved remission: Secondary | ICD-10-CM

## 2017-09-13 DIAGNOSIS — N138 Other obstructive and reflux uropathy: Secondary | ICD-10-CM | POA: Diagnosis not present

## 2017-09-13 DIAGNOSIS — Z1159 Encounter for screening for other viral diseases: Secondary | ICD-10-CM

## 2017-09-14 LAB — HEPATITIS C ANTIBODY
HEP C AB: NONREACTIVE
SIGNAL TO CUT-OFF: 0.06 (ref <1.00)

## 2017-09-14 LAB — PSA, TOTAL WITH REFLEX TO PSA, FREE: PSA, TOTAL: 0.2 ng/mL (ref <=4.0)

## 2017-09-14 LAB — LIPID PANEL
CHOL/HDL RATIO: 3.7 (calc) (ref <5.0)
Cholesterol: 110 mg/dL (ref <200)
HDL: 30 mg/dL — ABNORMAL LOW (ref >40)
LDL CHOLESTEROL (CALC): 66 mg/dL
NON-HDL CHOLESTEROL (CALC): 80 mg/dL (ref <130)
Triglycerides: 48 mg/dL (ref <150)

## 2017-09-14 LAB — HEMOGLOBIN A1C
HEMOGLOBIN A1C: 5.1 %{Hb} (ref <5.7)
MEAN PLASMA GLUCOSE: 100 (calc)
eAG (mmol/L): 5.5 (calc)

## 2017-09-14 LAB — T4, FREE: Free T4: 1.2 ng/dL (ref 0.8–1.8)

## 2017-09-14 LAB — TSH: TSH: 1.28 mIU/L (ref 0.40–4.50)

## 2017-09-17 DIAGNOSIS — M8458XS Pathological fracture in neoplastic disease, other specified site, sequela: Secondary | ICD-10-CM | POA: Diagnosis not present

## 2017-09-17 DIAGNOSIS — C9001 Multiple myeloma in remission: Secondary | ICD-10-CM | POA: Diagnosis not present

## 2017-09-17 DIAGNOSIS — Z01812 Encounter for preprocedural laboratory examination: Secondary | ICD-10-CM | POA: Diagnosis not present

## 2017-09-17 DIAGNOSIS — D7589 Other specified diseases of blood and blood-forming organs: Secondary | ICD-10-CM | POA: Diagnosis not present

## 2017-09-17 DIAGNOSIS — D72822 Plasmacytosis: Secondary | ICD-10-CM | POA: Diagnosis not present

## 2017-09-17 DIAGNOSIS — C9000 Multiple myeloma not having achieved remission: Secondary | ICD-10-CM | POA: Diagnosis not present

## 2017-09-25 DIAGNOSIS — C9000 Multiple myeloma not having achieved remission: Secondary | ICD-10-CM | POA: Diagnosis not present

## 2017-10-02 DIAGNOSIS — C9001 Multiple myeloma in remission: Secondary | ICD-10-CM | POA: Diagnosis not present

## 2017-10-02 DIAGNOSIS — M25511 Pain in right shoulder: Secondary | ICD-10-CM | POA: Diagnosis not present

## 2017-10-02 DIAGNOSIS — Z9484 Stem cells transplant status: Secondary | ICD-10-CM | POA: Diagnosis not present

## 2017-10-02 DIAGNOSIS — G629 Polyneuropathy, unspecified: Secondary | ICD-10-CM | POA: Diagnosis not present

## 2017-10-19 ENCOUNTER — Ambulatory Visit (INDEPENDENT_AMBULATORY_CARE_PROVIDER_SITE_OTHER): Payer: BLUE CROSS/BLUE SHIELD | Admitting: Family Medicine

## 2017-10-19 ENCOUNTER — Encounter: Payer: Self-pay | Admitting: Family Medicine

## 2017-10-19 VITALS — BP 112/69 | HR 61 | Temp 98.5°F | Resp 16 | Ht 72.0 in | Wt 284.0 lb

## 2017-10-19 DIAGNOSIS — N401 Enlarged prostate with lower urinary tract symptoms: Secondary | ICD-10-CM

## 2017-10-19 DIAGNOSIS — Z1211 Encounter for screening for malignant neoplasm of colon: Secondary | ICD-10-CM | POA: Diagnosis not present

## 2017-10-19 DIAGNOSIS — N138 Other obstructive and reflux uropathy: Secondary | ICD-10-CM | POA: Diagnosis not present

## 2017-10-19 DIAGNOSIS — M7551 Bursitis of right shoulder: Secondary | ICD-10-CM

## 2017-10-19 DIAGNOSIS — C9001 Multiple myeloma in remission: Secondary | ICD-10-CM | POA: Diagnosis not present

## 2017-10-19 NOTE — Assessment & Plan Note (Signed)
Due for routine colon cancer screening. Never had colonoscopy - Referral today for [facility] to get scheduled for initial screening colonoscopy - now that negative report from Oncology

## 2017-10-19 NOTE — Progress Notes (Signed)
Subjective:    Patient ID: Kyle Parker, male    DOB: 1956/05/03, 62 y.o.   MRN: [id number]  Kyle Parker is a 62 y.o. male presenting on 10/19/2017 for Benign Prostatic Hypertrophy   HPI   R Shoulder Pain - Last visit with me 09/07/17, for initial visit for same problem, treated with conservative care and home exercises, see prior notes for background information. - Interval update with [facility] PET Scan 09/25/17 showed some localized activity in joint not bony problem but concern for inflammation as advised by his Oncologist to return here - Today patient reports doing well now >50% better now, seems gradually improving, no recent flare ups or new injury, no new changes to medicines, not taking NSAID. history in past on chemotherapy he had episode of AKI in past and required hospitalized, was advised to limit NSAIDs in future - He takes Tylenol occasionally. Rarely takes Ibuprofen PRN - Also had prior history of brusitis in R shoulder in past many years ago, similar to current - Has not had X-ray of shoulder - No prior injection or similar treatment, no prior surgery - Denies new injury pain swelling, numbness tingling weakness  BPH LUTS (Nocturia) - Last visit with me 09/07/17, for initial visit for same problem, treated with Silodosin 55m daily, see prior notes for background information. - Today patient reports doing very well on Rapaflo 4368m took up to 2-3 weeks to take full effect, now he can sleep through the night most of time, rarely x 1 nocturia, and daytime symptoms improved - Denies side effects, tolerating well - It is brand name med, Rapaflo cost $35, still paying deductible - Denies dysuria, hematuria, frequency, obstruction pain  Score 10/19/17 (on Rapaflo 68m28mAUA BPH Symptom Score over past 1 month 1. Sensation of not emptying bladder post void - 0 2. Urinate less than 2 hour after finish last void - 1 3. Start/Stop several times during void - 0 4. Difficult to  postpone urination - 1 5. Weak urinary stream - 0 6. Push or strain urination - 0 7. Nocturia - 0 times  Total Score: 2 (Mild BPH symptoms) (Last score 6-7 - Mild, from 09/07/17 - no medicine)  Multiple Myeloma, in remission Recently seen by [facility] Dr KanTracey HarriesTayJoretta Bachelor, on 10/02/17 for follow-up after recent testing PET BM bx and labs, results were reported to be negative and still in remission, he has been continued on preventive treatment  Health Maintenance:  Colon CA Screening: Never had colonoscopy. Currently asymptomatic. No known family history of colon CA. Due for screening test age >50>42lso requested by [facility] history of MM. Now he had negative PET scan and testing, he wants to proceed with referral to GI - ready to schedule  [facility] 10/19/2017 09/07/2017 03/07/2016  Decreased Interest 0 0 0  Down, Depressed, Hopeless 0 0 0  PHQ - 2 Score 0 0 0    Social History   Tobacco Use  . Smoking status: Never Smoker  . Smokeless tobacco: Current User    Types: Chew  Substance Use Topics  . Alcohol use: No    Alcohol/week: 0.0 oz  . Drug use: No    Review of Systems Per HPI unless specifically indicated above     Objective:    BP 112/69   Pulse 61   Temp 98.5 F (36.9 C) (Oral)   Resp 16   Ht 6' (1.829 m)  Wt 284 lb (128.8 kg)   BMI 38.52 kg/m   Wt Readings from Last 3 Encounters:  10/19/17 284 lb (128.8 kg)  09/07/17 276 lb (125.2 kg)  04/11/16 297 lb 4.6 oz (134.8 kg)    Physical Exam  Constitutional: He is oriented to person, place, and time. He appears well-developed and well-nourished. No distress.  Well-appearing, comfortable, cooperative  HENT:  Head: Normocephalic and atraumatic.  Mouth/Throat: Oropharynx is clear and moist.  Neck: Normal range of motion. Neck supple.  Cardiovascular: Normal rate and intact distal pulses.  Pulmonary/Chest: Effort normal.  Musculoskeletal: He exhibits no edema.  Right  Shoulder Inspection: Normal appearance bilateral symmetrical Palpation: Non-tender to palpation over anterior, lateral, or posterior shoulder  ROM: Slightly limited abduction extended out from body due to discomfort only at certain point in ROM, otherwise intact, otherwise normal intact active ROM forward flexion, internal / external rotation, symmetrical Special Testing: Rotator cuff testing negative for weakness with supraspinatus full can and empty can test, Hawkin's AC impingement negative for pain Strength: Normal strength 5/5 flex/ext, ext rot / int rot, grip, rotator cuff str testing. Neurovascular: Distally intact pulses, sensation to light touch  Neurological: He is alert and oriented to person, place, and time.  Skin: Skin is warm and dry. No rash noted. He is not diaphoretic. No erythema.  Psychiatric: He has a normal mood and affect. His behavior is normal.  Well groomed, good eye contact, normal speech and thoughts  Nursing note and vitals reviewed.    Results for orders placed or performed in visit on 09/13/17  Lipid panel  Result Value Ref Range   Cholesterol 110 <200 mg/dL   HDL 30 (L) >40 mg/dL   Triglycerides 48 <150 mg/dL   LDL Cholesterol (Calc) 66 mg/dL (calc)   Total CHOL/HDL Ratio 3.7 <5.0 (calc)   Non-HDL Cholesterol (Calc) 80 <130 mg/dL (calc)  PSA, Total with Reflex to PSA, Free  Result Value Ref Range   PSA, Total 0.2 < OR = 4.0 ng/mL  T4, free  Result Value Ref Range   Free T4 1.2 0.8 - 1.8 ng/dL  TSH  Result Value Ref Range   TSH 1.28 0.40 - 4.50 mIU/L  Hemoglobin A1c  Result Value Ref Range   Hgb A1c MFr Bld 5.1 <5.7 % of total Hgb   Mean Plasma Glucose 100 (calc)   eAG (mmol/L) 5.5 (calc)  Hepatitis C antibody  Result Value Ref Range   Hepatitis C Ab NON-REACTIVE NON-REACTI   SIGNAL TO CUT-OFF 0.06 <1.00      Assessment & Plan:   Problem List Items Addressed This Visit    BPH with obstruction/lower urinary tract symptoms - Primary     Significantly improved on Rapaflo chronic BPH with now resolved nocturia w/o obstruction - AUA BPH score 6-7 >> 2 improved - Prior use Flomax did well, just stopped taking - Last PSA 0.2 negative (2018) - Last DRE reported normal - No known personal/family history of prostate CA  Plan: 1. Continue Rapaflo 20m daily 2. Follow-up in future, PSA etc      Multiple myeloma in remission ([facility]    Followed by [facility](Dr KTracey Harries and TJoretta BachelorPA), recently had PET scan and BM biopsy and labs, overall results were negative and patient advised that he was still in remission Remains on preventative treatment on Revlimid Follow-up as advised by Oncology      Screening for colon cancer    Due for routine colon cancer screening. Never  had colonoscopy - Referral today for [facility] to get scheduled for initial screening colonoscopy - now that negative report from Oncology      Relevant Orders   Ambulatory referral to Gastroenterology    Other Visit Diagnoses    Bursitis of right shoulder     Consistent with subacute to acute R-shoulder bursitis - IMPROVED now with conservative care - without significant evidence of muscle tear (no weakness).  - No clear etiology of injury. Likely underlying arthritis - No imaging on chart (shoulder, except had PET scan showed possible inflammation)  Plan: 1. Agree with AVOIDING oral NSAIDs - only use rarely and small dose - especially with history of AKI in past -  Emphasized start with Tylenol Ex Str 1-2 q 6 hr regularly vs PRN 2. Relative rest but keep shoulder mobile, demonstrated ROM exercises, avoid heavy lifting 3. May try heating pad PRN  Follow-up 4-6 weeks if not improved for re-evaluation, consider referral to Physical Therapy, X-rays, and or subacromial steroid injection       No orders of the defined types were placed in this encounter.   Orders Placed This Encounter  Procedures  . Ambulatory referral to Gastroenterology     Referral Priority:   Routine    Referral Type:   Consultation    Referral Reason:   Specialty Services Required    Number of Visits Requested:   1    Follow up plan: Return in about 6 months (around 04/21/2018) for BPH, Shoulder pain, f/u Onc.  Nobie Putnam, [facility] 10/19/2017, 12:11 PM

## 2017-10-19 NOTE — Assessment & Plan Note (Addendum)
Followed by [facility] (Dr Tracey Harries, and Joretta Bachelor PA), recently had PET scan and BM biopsy and labs, overall results were negative and patient advised that he was still in remission Remains on preventative treatment on Revlimid Follow-up as advised by Oncology

## 2017-10-19 NOTE — Assessment & Plan Note (Signed)
Significantly improved on Rapaflo chronic BPH with now resolved nocturia w/o obstruction - AUA BPH score 6-7 >> 2 improved - Prior use Flomax did well, just stopped taking - Last PSA 0.3 negative (2017) - Last DRE reported normal - No known personal/family history of prostate CA  Plan: 1. Continue Rapaflo 4mg  daily 2. Follow-up in future, PSA etc

## 2017-10-19 NOTE — Patient Instructions (Addendum)
Thank you for coming to the office today.  1.  For Shoulder likely Bursitis flare, now improving   Recommend to start taking Tylenol Extra Strength 500mg  tabs - take 1 to 2 tabs per dose (max 1000mg ) every 6-8 hours for pain (take regularly, don't skip a dose for next 7 days), max 24 hour daily dose is 6 tablets or 3000mg . In the future you can repeat the same everyday Tylenol course for 1-2 weeks at a time.   Prefer to avoid Advil/Ibuprofen/Aleve  In future can consider X-ray, Injection (steroid), or other options if need  ------- Prostate, keep taking Rapaflo 4mg  daily, sounds like it is working well, no change today   GASTROENTEROLOGY (GI)  [facility]) [address] [place], [place] [address] Phone: [phone]  [facility]) [place]. [place], [place] [address] Main: [phone]  Please schedule a Follow-up Appointment to: Return in about 6 months (around 04/21/2018) for BPH, Shoulder pain, f/u Onc.  If you have any other questions or concerns, please feel free to call the office or send a message through [place]. You may also schedule an earlier appointment if necessary.  Additionally, you may be receiving a survey about your experience at our office within a few days to 1 week by e-mail or mail. We value your feedback.  Nobie Putnam, DO [place]

## 2017-10-26 ENCOUNTER — Other Ambulatory Visit: Payer: Self-pay

## 2017-10-26 ENCOUNTER — Telehealth: Payer: Self-pay

## 2017-10-26 DIAGNOSIS — Z1211 Encounter for screening for malignant neoplasm of colon: Secondary | ICD-10-CM

## 2017-10-26 MED ORDER — NA SULFATE-K SULFATE-MG SULF 17.5-3.13-1.6 GM/177ML PO SOLN: 1.0000 | ORAL | 0 refills | Status: DC

## 2017-10-26 NOTE — Telephone Encounter (Signed)
Gastroenterology Pre-Procedure Review  Request Date:  Requesting Physician: Dr.   PATIENT REVIEW QUESTIONS: The patient responded to the following health history questions as indicated:    1. Are you having any GI issues? no 2. Do you have a personal history of Polyps? no 3. Do you have a family history of Colon Cancer or Polyps? no 4. Diabetes Mellitus? no 5. Joint replacements in the past 12 months?no 6. Major health problems in the past 3 months?no 7. Any artificial heart valves, MVP, or defibrillator?no    MEDICATIONS & ALLERGIES:    Patient reports the following regarding taking any anticoagulation/antiplatelet therapy:   Plavix, Coumadin, Eliquis, Xarelto, Lovenox, Pradaxa, Brilinta, or Effient? no Aspirin? no  Patient confirms/reports the following medications:  Current Outpatient Medications  Medication Sig Dispense Refill  . acyclovir (ZOVIRAX) 400 MG tablet Take 1 tablet (400 mg total) by mouth 2 (two) times daily. 60 tablet 3  . aspirin EC 81 MG tablet Take by mouth.    . gabapentin (NEURONTIN) 300 MG capsule Take by mouth.    . lenalidomide (REVLIMID) 25 MG capsule Take one capsule daily on days 1-14 every 21 days. 14 capsule 3  . Na Sulfate-K Sulfate-Mg Sulf (SUPREP BOWEL PREP KIT) 17.5-3.13-1.6 GM/177ML SOLN Take 1 kit by mouth as directed. 1 Bottle 0  . silodosin (RAPAFLO) 4 MG CAPS capsule Take 1 capsule (4 mg total) by mouth daily with breakfast. 30 capsule 5  . Vitamin D, Ergocalciferol, (DRISDOL) 50000 units CAPS capsule Take by mouth.     No current facility-administered medications for this visit.     Patient confirms/reports the following allergies:  No Known Allergies  No orders of the defined types were placed in this encounter.   AUTHORIZATION INFORMATION Primary Insurance: 1D#: Group #:  Secondary Insurance: 1D#: Group #:  SCHEDULE INFORMATION: Date: 10/31/17 Time: Location: [place]

## 2017-10-29 ENCOUNTER — Encounter: Payer: Self-pay | Admitting: *Deleted

## 2017-10-29 ENCOUNTER — Other Ambulatory Visit: Payer: Self-pay

## 2017-10-30 DIAGNOSIS — C9001 Multiple myeloma in remission: Secondary | ICD-10-CM | POA: Diagnosis not present

## 2017-10-30 NOTE — Discharge Instructions (Signed)
General Anesthesia, Adult, Care After °These instructions provide you with information about caring for yourself after your procedure. Your health care provider may also give you more specific instructions. Your treatment has been planned according to current medical practices, but problems sometimes occur. Call your health care provider if you have any problems or questions after your procedure. °What can I expect after the procedure? °After the procedure, it is common to have: °· Vomiting. °· A sore throat. °· Mental slowness. ° °It is common to feel: °· Nauseous. °· Cold or shivery. °· Sleepy. °· Tired. °· Sore or achy, even in parts of your body where you did not have surgery. ° °Follow these instructions at home: °For at least 24 hours after the procedure: °· Do not: °? Participate in activities where you could fall or become injured. °? Drive. °? Use heavy machinery. °? Drink alcohol. °? Take sleeping pills or medicines that cause drowsiness. °? Make important decisions or sign legal documents. °? Take care of children on your own. °· Rest. °Eating and drinking °· If you vomit, drink water, juice, or soup when you can drink without vomiting. °· Drink enough fluid to keep your urine clear or pale yellow. °· Make sure you have little or no nausea before eating solid foods. °· Follow the diet recommended by your health care provider. °General instructions °· Have a responsible adult stay with you until you are awake and alert. °· Return to your normal activities as told by your health care provider. Ask your health care provider what activities are safe for you. °· Take over-the-counter and prescription medicines only as told by your health care provider. °· If you smoke, do not smoke without supervision. °· Keep all follow-up visits as told by your health care provider. This is important. °Contact a health care provider if: °· You continue to have nausea or vomiting at home, and medicines are not helpful. °· You  cannot drink fluids or start eating again. °· You cannot urinate after 8-12 hours. °· You develop a skin rash. °· You have fever. °· You have increasing redness at the site of your procedure. °Get help right away if: °· You have difficulty breathing. °· You have chest pain. °· You have unexpected bleeding. °· You feel that you are having a life-threatening or urgent problem. °This information is not intended to replace advice given to you by your health care provider. Make sure you discuss any questions you have with your health care provider. °Document Released: 10/30/2000 Document Revised: 12/27/2015 Document Reviewed: 07/08/2015 °Elsevier Interactive Patient Education © 2018 [employer] Inc[employer] ° °

## 2017-10-31 ENCOUNTER — Ambulatory Visit
Admission: RE | Admit: 2017-10-31 | Discharge: 2017-10-31 | Disposition: A | Discharge status: Home / Self Care | Payer: BLUE CROSS/BLUE SHIELD | Source: Ambulatory Visit | Attending: Gastroenterology | Admitting: Gastroenterology

## 2017-10-31 ENCOUNTER — Ambulatory Visit: Payer: BLUE CROSS/BLUE SHIELD | Admitting: Anesthesiology

## 2017-10-31 ENCOUNTER — Encounter
Admission: RE | Disposition: A | Discharge status: Home / Self Care | Payer: Self-pay | Source: Ambulatory Visit | Attending: Gastroenterology

## 2017-10-31 DIAGNOSIS — Z7982 Long term (current) use of aspirin: Secondary | ICD-10-CM | POA: Insufficient documentation

## 2017-10-31 DIAGNOSIS — Z1211 Encounter for screening for malignant neoplasm of colon: Secondary | ICD-10-CM | POA: Insufficient documentation

## 2017-10-31 DIAGNOSIS — K648 Other hemorrhoids: Secondary | ICD-10-CM | POA: Diagnosis not present

## 2017-10-31 DIAGNOSIS — Z6838 Body mass index (BMI) 38.0-38.9, adult: Secondary | ICD-10-CM | POA: Diagnosis not present

## 2017-10-31 DIAGNOSIS — Z9221 Personal history of antineoplastic chemotherapy: Secondary | ICD-10-CM | POA: Insufficient documentation

## 2017-10-31 DIAGNOSIS — D122 Benign neoplasm of ascending colon: Secondary | ICD-10-CM | POA: Diagnosis not present

## 2017-10-31 DIAGNOSIS — D124 Benign neoplasm of descending colon: Secondary | ICD-10-CM | POA: Insufficient documentation

## 2017-10-31 DIAGNOSIS — Z8579 Personal history of other malignant neoplasms of lymphoid, hematopoietic and related tissues: Secondary | ICD-10-CM | POA: Diagnosis not present

## 2017-10-31 DIAGNOSIS — Z923 Personal history of irradiation: Secondary | ICD-10-CM | POA: Diagnosis not present

## 2017-10-31 DIAGNOSIS — Z79899 Other long term (current) drug therapy: Secondary | ICD-10-CM | POA: Insufficient documentation

## 2017-10-31 HISTORY — PX: POLYPECTOMY: SHX5525

## 2017-10-31 HISTORY — PX: COLONOSCOPY WITH PROPOFOL: SHX5780

## 2017-10-31 SURGERY — COLONOSCOPY WITH PROPOFOL
Anesthesia: General | Site: Rectum | Wound class: Contaminated

## 2017-10-31 MED ORDER — LIDOCAINE HCL (CARDIAC) 20 MG/ML IV SOLN
INTRAVENOUS | Status: DC | PRN
  Administered 2017-10-31: 50 mg via INTRAVENOUS

## 2017-10-31 MED ORDER — LACTATED RINGERS IV SOLN
INTRAVENOUS | Status: DC
  Administered 2017-10-31: 10:00:00 via INTRAVENOUS

## 2017-10-31 MED ORDER — STERILE WATER FOR IRRIGATION IR SOLN
Status: DC | PRN
  Administered 2017-10-31: .5 mL

## 2017-10-31 MED ORDER — GLYCOPYRROLATE 0.2 MG/ML IJ SOLN
INTRAMUSCULAR | Status: DC | PRN
  Administered 2017-10-31 (×2): 0.2 mg via INTRAVENOUS

## 2017-10-31 MED ORDER — PROPOFOL 10 MG/ML IV BOLUS
INTRAVENOUS | Status: DC | PRN
  Administered 2017-10-31 (×2): 30 mg via INTRAVENOUS
  Administered 2017-10-31: 20 mg via INTRAVENOUS
  Administered 2017-10-31: 30 mg via INTRAVENOUS
  Administered 2017-10-31: 20 mg via INTRAVENOUS
  Administered 2017-10-31: 50 mg via INTRAVENOUS
  Administered 2017-10-31: 100 mg via INTRAVENOUS
  Administered 2017-10-31: 20 mg via INTRAVENOUS
  Administered 2017-10-31: 30 mg via INTRAVENOUS
  Administered 2017-10-31: 50 mg via INTRAVENOUS

## 2017-10-31 MED ORDER — SODIUM CHLORIDE 0.9 % IV SOLN: INTRAVENOUS | Status: DC

## 2017-10-31 SURGICAL SUPPLY — 17 items
CANISTER SUCT 1200ML W/VALVE (MISCELLANEOUS) ×3 IMPLANT
CLIP HMST 235XBRD CATH ROT (MISCELLANEOUS) IMPLANT
CLIP RESOLUTION 360 11X235 (MISCELLANEOUS)
ELECT REM PT RETURN 9FT ADLT (ELECTROSURGICAL)
ELECTRODE REM PT RTRN 9FT ADLT (ELECTROSURGICAL) IMPLANT
FORCEPS BIOP RAD 4 LRG CAP 4 (CUTTING FORCEPS) ×3 IMPLANT
GOWN CVR UNV OPN BCK APRN NK (MISCELLANEOUS) ×4 IMPLANT
GOWN ISOL THUMB LOOP REG UNIV (MISCELLANEOUS) ×2
INJECTOR VARIJECT VIN23 (MISCELLANEOUS) IMPLANT
KIT ENDO PROCEDURE OLY (KITS) ×3 IMPLANT
MARKER SPOT ENDO TATTOO 5ML (MISCELLANEOUS) IMPLANT
SNARE COLD EXACTO (MISCELLANEOUS) ×3 IMPLANT
SNARE SHORT THROW 13M SML OVAL (MISCELLANEOUS) IMPLANT
SPOT EX ENDOSCOPIC TATTOO (MISCELLANEOUS)
TRAP ETRAP POLY (MISCELLANEOUS) ×3 IMPLANT
VARIJECT INJECTOR VIN23 (MISCELLANEOUS)
WATER STERILE IRR 250ML POUR (IV SOLUTION) ×3 IMPLANT

## 2017-10-31 NOTE — Transfer of Care (Signed)
Immediate Anesthesia Transfer of Care Note  Patient: Kyle Parker  Procedure(s) Performed: COLONOSCOPY WITH PROPOFOL (N/A Rectum) POLYPECTOMY (Rectum)  Patient Location: PACU  Anesthesia Type: General  Level of Consciousness: awake, alert  and patient cooperative  Airway and Oxygen Therapy: Patient Spontanous Breathing and Patient connected to supplemental oxygen  Post-op Assessment: Post-op Vital signs reviewed, Patient's Cardiovascular Status Stable, Respiratory Function Stable, Patent Airway and No signs of Nausea or vomiting  Post-op Vital Signs: Reviewed and stable  Complications: No apparent anesthesia complications

## 2017-10-31 NOTE — Anesthesia Preprocedure Evaluation (Addendum)
Anesthesia Evaluation  Patient identified by MRN, date of birth, ID band Patient awake    Reviewed: Allergy & Precautions, NPO status , Patient's Chart, lab work & pertinent test results  History of Anesthesia Complications Negative for: history of anesthetic complications  Airway Mallampati: III  TM Distance: >3 FB Neck ROM: Full    Dental  (+)    Pulmonary neg pulmonary ROS,    Pulmonary exam normal breath sounds clear to auscultation       Cardiovascular Exercise Tolerance: Good negative cardio ROS Normal cardiovascular exam Rhythm:Regular Rate:Normal     Neuro/Psych negative neurological ROS     GI/Hepatic negative GI ROS,   Endo/Other  Morbid obesity  Renal/GU negative Renal ROS     Musculoskeletal  (+) Arthritis ,   Abdominal   Peds  Hematology Multiple myeloma s/p radiation, chemo, and SCT   Anesthesia Other Findings   Reproductive/Obstetrics                            Anesthesia Physical Anesthesia Plan  ASA: III  Anesthesia Plan: General   Post-op Pain Management:    Induction: Intravenous  PONV Risk Score and Plan: 2 and TIVA and Propofol infusion  Airway Management Planned: Natural Airway  Additional Equipment:   Intra-op Plan:   Post-operative Plan:   Informed Consent: I have reviewed the patients History and Physical, chart, labs and discussed the procedure including the risks, benefits and alternatives for the proposed anesthesia with the patient or authorized representative who has indicated his/her understanding and acceptance.     Plan Discussed with: CRNA  Anesthesia Plan Comments:         Anesthesia Quick Evaluation

## 2017-10-31 NOTE — H&P (Signed)
Cephas Darby, MD [address]  [place]  [place], [place] [address]  Main: [phone]  Fax: [phone] Pager: [phone]  Primary Care Physician:  Olin Hauser, DO Primary Gastroenterologist:  Dr. Cephas Darby  Pre-Procedure History & Physical: HPI:  Kyle Parker is a 62 y.o. male is here for an colonoscopy.   Past Medical History:  Diagnosis Date  . Arthritis   . Compression fracture of cervical spine at C2-C3 level    Rods in neck. limited L/R movement  . Lytic bone lesions on xray   . Multiple myeloma without remission [facility])     Past Surgical History:  Procedure Laterality Date  . biopsy of the vertebral body     . CT GUIDED BONE BIOPSY  03/23/2016  . NECK SURGERY      Prior to Admission medications   Medication Sig Start Date End Date Taking? Authorizing Provider  acyclovir (ZOVIRAX) 400 MG tablet Take 1 tablet (400 mg total) by mouth 2 (two) times daily. 03/21/16  Yes Cammie Sickle, MD  aspirin EC 81 MG tablet Take by mouth.   Yes [provider]  gabapentin (NEURONTIN) 300 MG capsule Take by mouth. 09/15/16 10/29/17 Yes [provider]  lenalidomide (REVLIMID) 25 MG capsule Take one capsule daily on days 1-14 every 21 days. 03/21/16  Yes Cammie Sickle, MD  Na Sulfate-K Sulfate-Mg Sulf (SUPREP BOWEL PREP KIT) 17.5-3.13-1.6 GM/177ML SOLN Take 1 kit by mouth as directed. 10/26/17  Yes Lin Landsman, MD  silodosin (RAPAFLO) 4 MG CAPS capsule Take 1 capsule (4 mg total) by mouth daily with breakfast. 09/07/17  Yes Karamalegos, Devonne Doughty, DO  Vitamin D, Ergocalciferol, (DRISDOL) 50000 units CAPS capsule Take by mouth.   Yes [provider]    Allergies as of 10/26/2017  . (No Known Allergies)    Family History  Problem Relation Age of Onset  . Heart attack Father   . Renal cancer Father   . CAD Mother   . Diabetes Mellitus II Mother   . Heart disease Mother   . Diabetes Mother   . Skin cancer  Mother        non melanoma  . Prostate cancer Neg Hx   . Colon cancer Neg Hx     Social History   Socioeconomic History  . Marital status: Married    Spouse name: Not on file  . Number of children: Not on file  . Years of education: Not on file  . Highest education level: Not on file  Occupational History  . Not on file  Social Needs  . Financial resource strain: Not on file  . Food insecurity:    Worry: Not on file    Inability: Not on file  . Transportation needs:    Medical: Not on file    Non-medical: Not on file  Tobacco Use  . Smoking status: Never Smoker  . Smokeless tobacco: Current User    Types: Chew  Substance and Sexual Activity  . Alcohol use: No    Alcohol/week: 0.0 oz  . Drug use: No  . Sexual activity: Not on file  Lifestyle  . Physical activity:    Days per week: Not on file    Minutes per session: Not on file  . Stress: Not on file  Relationships  . Social connections:    Talks on phone: Not on file    Gets together: Not on file    Attends religious service:  Not on file    Active member of club or organization: Not on file    Attends meetings of clubs or organizations: Not on file    Relationship status: Not on file  . Intimate partner violence:    Fear of current or ex partner: Not on file    Emotionally abused: Not on file    Physically abused: Not on file    Forced sexual activity: Not on file  Other Topics Concern  . Not on file  Social History Narrative  . Not on file    Review of Systems: See HPI, otherwise negative ROS  Physical Exam: BP 115/70   Pulse (!) 43   Temp (!) 97.5 F (36.4 C) (Temporal)   Resp 16   Ht 6' (1.829 m)   Wt 277 lb (125.6 kg)   SpO2 99%   BMI 37.57 kg/m  General:   Alert,  pleasant and cooperative in NAD Head:  Normocephalic and atraumatic. Neck:  Supple; no masses or thyromegaly. Lungs:  Clear throughout to auscultation.    Heart:  Regular rate and rhythm. Abdomen:  Soft, nontender and  nondistended. Normal bowel sounds, without guarding, and without rebound.   Neurologic:  Alert and  oriented x4;  grossly normal neurologically.  Impression/Plan: [name] is here for an colonoscopy to be performed for colon cancer screening  Risks, benefits, limitations, and alternatives regarding  colonoscopy have been reviewed with the patient.  Questions have been answered.  All parties agreeable.   Sherri Sear, MD  10/31/2017, 10:18 AM

## 2017-10-31 NOTE — Anesthesia Procedure Notes (Signed)
Procedure Name: MAC Date/Time: 10/31/2017 10:25 AM Performed by: Janna Arch, CRNA Pre-anesthesia Checklist: Patient identified, Emergency Drugs available, Suction available and Patient being monitored Patient Re-evaluated:Patient Re-evaluated prior to induction Oxygen Delivery Method: Nasal cannula

## 2017-10-31 NOTE — Anesthesia Postprocedure Evaluation (Signed)
Anesthesia Post Note  Patient: Kyle Parker  Procedure(s) Performed: COLONOSCOPY WITH PROPOFOL (N/A Rectum) POLYPECTOMY (Rectum)  Patient location during evaluation: PACU Anesthesia Type: General Level of consciousness: awake and alert, oriented and patient cooperative Pain management: pain level controlled Vital Signs Assessment: post-procedure vital signs reviewed and stable Respiratory status: spontaneous breathing, nonlabored ventilation and respiratory function stable Cardiovascular status: blood pressure returned to baseline and stable Postop Assessment: adequate PO intake Anesthetic complications: no    Darrin Nipper

## 2017-10-31 NOTE — Op Note (Signed)
[facility] Gastroenterology Patient Name: Kyle Parker Procedure Date: 10/31/2017 10:18 AM MRN: [id number] Account #: [id number] Date of Birth: 16-Apr-1956 Admit Type: Outpatient Age: 62 Room: [facility] OR ROOM 01 Gender: Male Note Status: Finalized Procedure:            Colonoscopy Indications:          Screening for colorectal malignant neoplasm, This is                        the patient's first colonoscopy Providers:            Lin Landsman MD, MD Referring MD:         Olin Hauser (Referring MD) Medicines:            Monitored Anesthesia Care Complications:        No immediate complications. Estimated blood loss: None. Procedure:            Pre-Anesthesia Assessment:                       - Prior to the procedure, a History and Physical was                        performed, and patient medications and allergies were                        reviewed. The patient is competent. The risks and                        benefits of the procedure and the sedation options and                        risks were discussed with the patient. All questions                        were answered and informed consent was obtained.                        Patient identification and proposed procedure were                        verified by the physician, the nurse, the                        anesthesiologist, the anesthetist and the technician in                        the pre-procedure area in the procedure room in the                        endoscopy suite. Mental Status Examination: alert and                        oriented. Airway Examination: normal oropharyngeal                        airway and neck mobility. Respiratory Examination:                        clear to auscultation. CV Examination: normal.  Prophylactic Antibiotics: The patient does not require                        prophylactic antibiotics. Prior Anticoagulants: The              patient has taken aspirin. ASA Grade Assessment: III -                        A patient with severe systemic disease. After reviewing                        the risks and benefits, the patient was deemed in                        satisfactory condition to undergo the procedure. The                        anesthesia plan was to use monitored anesthesia care                        (MAC). Immediately prior to administration of                        medications, the patient was re-assessed for adequacy                        to receive sedatives. The heart rate, respiratory rate,                        oxygen saturations, blood pressure, adequacy of                        pulmonary ventilation, and response to care were                        monitored throughout the procedure. The physical status                        of the patient was re-assessed after the procedure.                       After obtaining informed consent, the colonoscope was                        passed under direct vision. Throughout the procedure,                        the patient's blood pressure, pulse, and oxygen                        saturations were monitored continuously. The Olympus                        CF-HQ190L Colonoscope (S#. [id number]) was introduced                        through the anus and advanced to the the cecum,                        identified by appendiceal orifice and ileocecal valve.  The colonoscopy was performed without difficulty. The                        patient tolerated the procedure well. The quality of                        the bowel preparation was evaluated using the BBPS                        [facility] Bowel Preparation Scale) with scores of: Right                        Colon = 3, Transverse Colon = 3 and Left Colon = 3                        (entire mucosa seen well with no residual staining,                        small fragments of stool or  opaque liquid). The total                        BBPS score equals 9. Findings:      The perianal and digital rectal examinations were normal. Pertinent       negatives include normal sphincter tone and no palpable rectal lesions.      A 5 mm polyp was found in the ascending colon. The polyp was sessile.       The polyp was removed with a cold snare. Resection and retrieval were       complete.      A diminutive polyp was found in the descending colon. The polyp was       sessile. The polyp was removed with a cold biopsy forceps. Resection and       retrieval were complete.      Non-bleeding internal hemorrhoids were found during retroflexion. The       hemorrhoids were medium-sized.      The exam was otherwise without abnormality. Impression:           - One 5 mm polyp in the ascending colon, removed with a                        cold snare. Resected and retrieved.                       - One diminutive polyp in the descending colon, removed                        with a cold biopsy forceps. Resected and retrieved.                       - Non-bleeding internal hemorrhoids.                       - The examination was otherwise normal. Recommendation:       - Discharge patient to home.                       - Resume previous diet today.                       -  Continue present medications.                       - Await pathology results.                       - Repeat colonoscopy in 5 years for surveillance. Procedure Code(s):    --- Professional ---                       [phone], Colonoscopy, flexible; with removal of tumor(s),                        polyp(s), or other lesion(s) by snare technique                       45380, 46, Colonoscopy, flexible; with biopsy, single                        or multiple Diagnosis Code(s):    --- Professional ---                       Z12.11, Encounter for screening for malignant neoplasm                        of colon                       D12.2,  Benign neoplasm of ascending colon                       D12.4, Benign neoplasm of descending colon                       K64.8, Other hemorrhoids CPT copyright 2016 American Medical Association. All rights reserved. The codes documented in this report are preliminary and upon coder review may  be revised to meet current compliance requirements. Dr. Ulyess Mort Lin Landsman MD, MD 10/31/2017 10:54:23 AM This report has been signed electronically. Number of Addenda: 0 Note Initiated On: 10/31/2017 10:18 AM Scope Withdrawal Time: 0 hours 14 minutes 16 seconds  Total Procedure Duration: 0 hours 17 minutes 47 seconds       [facility]

## 2017-11-01 ENCOUNTER — Encounter: Payer: Self-pay | Admitting: Gastroenterology

## 2017-11-05 ENCOUNTER — Telehealth: Payer: Self-pay | Admitting: Family Medicine

## 2017-11-06 ENCOUNTER — Encounter: Payer: Self-pay | Admitting: Family Medicine

## 2017-11-06 ENCOUNTER — Ambulatory Visit
Admission: RE | Admit: 2017-11-06 | Discharge: 2017-11-06 | Disposition: A | Discharge status: Home / Self Care | Payer: BLUE CROSS/BLUE SHIELD | Source: Ambulatory Visit | Attending: Family Medicine | Admitting: Family Medicine

## 2017-11-06 ENCOUNTER — Ambulatory Visit (INDEPENDENT_AMBULATORY_CARE_PROVIDER_SITE_OTHER): Payer: BLUE CROSS/BLUE SHIELD | Admitting: Family Medicine

## 2017-11-06 ENCOUNTER — Encounter: Payer: Self-pay | Admitting: Gastroenterology

## 2017-11-06 VITALS — BP 108/66 | HR 70 | Temp 98.1°F | Resp 16 | Ht 72.0 in | Wt 280.0 lb

## 2017-11-06 DIAGNOSIS — M7551 Bursitis of right shoulder: Secondary | ICD-10-CM

## 2017-11-06 DIAGNOSIS — M19011 Primary osteoarthritis, right shoulder: Secondary | ICD-10-CM | POA: Diagnosis not present

## 2017-11-06 DIAGNOSIS — C9000 Multiple myeloma not having achieved remission: Secondary | ICD-10-CM | POA: Diagnosis not present

## 2017-11-06 DIAGNOSIS — G8929 Other chronic pain: Secondary | ICD-10-CM | POA: Diagnosis not present

## 2017-11-06 DIAGNOSIS — M25511 Pain in right shoulder: Secondary | ICD-10-CM

## 2017-11-06 MED ORDER — METHYLPREDNISOLONE ACETATE 40 MG/ML IJ SUSP
40.0000 mg | Freq: Once | INTRAMUSCULAR | Status: AC
  Administered 2017-11-06: 40 mg via INTRA_ARTICULAR

## 2017-11-06 MED ORDER — LIDOCAINE HCL (PF) 1 % IJ SOLN
4.0000 mL | Freq: Once | INTRAMUSCULAR | Status: AC
  Administered 2017-11-06: 4 mL

## 2017-11-06 NOTE — Patient Instructions (Addendum)
Thank you for coming to the office today.  You received a Right Shoulder Joint steroid injection today. - Lidocaine numbing medicine may ease the pain initially for a few hours until it wears off - As discussed, you may experience a "steroid flare" this evening or within 24-48 hours, anytime medicine is injected into an inflamed joint it can cause the pain to get worse temporarily - Everyone responds differently to these injections, it depends on the patient and the severity of the joint problem, it may provide anywhere from days to weeks, to months of relief. Ideal response is >6 months relief - Try to take it easy for next 1-2 days, avoid over activity and strain on joint (limit lifting for shoulder) - Recommend the following:   - For swelling - rest, compression sleeve / ACE wrap, elevation, and ice packs as needed for first few days   - For pain in future may use heating pad or moist heat as needed  Medication - Recommend to start taking Tylenol Extra Strength 500mg  tabs - take 1 to 2 tabs per dose (max 1000mg ) every 6-8 hours for pain (take regularly, don't skip a dose for next 7 days), max 24 hour daily dose is 6 tablets or 3000mg . In the future you can repeat the same everyday Tylenol course for 1-2 weeks at a time.   --------------------------------------------  Wait for letter for result, they should contact you with letter and other advice, the results showed Tubular Adenoma, good for up to 5 years on next colonoscopy  Please schedule a Follow-up Appointment to: Return in about 6 weeks (around 12/18/2017), or if symptoms worsen or fail to improve, for Right Shoulder bursitis.  If you have any other questions or concerns, please feel free to call the office or send a message through [place]. You may also schedule an earlier appointment if necessary.  Additionally, you may be receiving a survey about your experience at our office within a few days to 1 week by e-mail or mail. We value  your feedback.  Kyle Putnam, DO [place]

## 2017-11-06 NOTE — Progress Notes (Addendum)
Subjective:    Patient ID: Kyle Parker, male    DOB: 1956-07-10, 62 y.o.   MRN: [id number]  Kyle Parker is a 62 y.o. male presenting on 11/06/2017 for Shoulder Pain (Right side)   HPI   R Shoulder Pain, Bursitis and Chronic Pain - Last visit with me 10/19/17, for follow-up same issue for R shoulder pain and bursitis, treated with conservative care and home exercises and continued Tylenol, see prior notes for background information. - Interval update about 3 days ago he was doing work and pulled a lawnmower and felt like something "stretched out" and he was doing better next day - Today patient reports doing better still now about 75 to 80% of normal, still has some pain with and soreness lifting straight up above shoulder and when lowering arm in motion - Taking Tylenol PRN, did not increase dose - History in past on chemotherapy he had episode of AKI in past and required hospitalized, was advised to limit NSAIDs in future - Also had prior history of brusitis in R shoulder in past many years ago, similar to current - Has not had X-ray of shoulder before - No prior injection or similar treatment, no prior surgery - Denies new injury pain swelling, numbness tingling weakness   Depression screen [facility] 2/9 10/19/2017 09/07/2017 03/07/2016  Decreased Interest 0 0 0  Down, Depressed, Hopeless 0 0 0  PHQ - 2 Score 0 0 0    Social History   Tobacco Use  . Smoking status: Never Smoker  . Smokeless tobacco: Current User    Types: Chew  Substance Use Topics  . Alcohol use: No    Alcohol/week: 0.0 oz  . Drug use: No    Review of Systems Per HPI unless specifically indicated above     Objective:    BP 108/66   Pulse 70   Temp 98.1 F (36.7 C) (Oral)   Resp 16   Ht 6' (1.829 m)   Wt 280 lb (127 kg)   BMI 37.97 kg/m   Wt Readings from Last 3 Encounters:  11/06/17 280 lb (127 kg)  10/31/17 277 lb (125.6 kg)  10/19/17 284 lb (128.8 kg)    Physical Exam  Constitutional: He is  oriented to person, place, and time. He appears well-developed and well-nourished. No distress.  Well-appearing, comfortable, cooperative  HENT:  Head: Normocephalic and atraumatic.  Mouth/Throat: Oropharynx is clear and moist.  Neck: Normal range of motion. Neck supple.  Cardiovascular: Normal rate and intact distal pulses.  Pulmonary/Chest: Effort normal.  Musculoskeletal: He exhibits no edema.  Right Shoulder Inspection: Normal appearance bilateral symmetrical Palpation: Mild tender anterior shoulder ROM: Improved ROM but slightly limited still abduction extended out from body due to discomfort only at certain point in ROM, otherwise intact, otherwise normal intact active ROM forward flexion, internal / external rotation, symmetrical Special Testing: Rotator cuff testing negative for weakness with supraspinatus full can and empty can test, Hawkin's AC impingement negative for pain Strength: Normal strength 5/5 flex/ext, ext rot / int rot, grip, rotator cuff str testing. Neurovascular: Distally intact pulses, sensation to light touch  Neurological: He is alert and oriented to person, place, and time.  Skin: Skin is warm and dry. No rash noted. He is not diaphoretic. No erythema.  Psychiatric: He has a normal mood and affect. His behavior is normal.  Well groomed, good eye contact, normal speech and thoughts  Nursing note and vitals reviewed.    ________________________________________________________ PROCEDURE NOTE  Date: 11/06/17 Right Shoulder Subacromial steroid injection Discussed benefits and risks (including pain, bleeding, infection, steroid flare). Verbal consent given by patient. Medication:  1 cc Depo-medrol 34m and 4 cc Lidocaine 1% without epi Time Out taken  Landmarks identified. Area cleansed with alcohol wipes.Using 21 gauge and 1, 1/2 inch needle, Right subacromial bursa space was injected (with above listed medication) via posterior approach cold spray used for  superficial anesthetic.Sterile bandage placed.Patient tolerated procedure well without bleeding or paresthesias.No complications.  -----------------------------------------  I have personally reviewed the radiology report from R Shoulder X-ray on 11/06/17.  CLINICAL DATA:  Right shoulder pain for approximately 3 months, history of multiple myeloma  EXAM: RIGHT SHOULDER - 2+ VIEW  COMPARISON:  CT chest of 03/15/2016  FINDINGS: There is mild degenerative joint disease of the right shoulder with some loss of joint space. There is a slightly downward sloping acromion which can predispose to impingement. Very mild degenerative change of the right AC joint is noted. No acute abnormality is seen. The ribs that are visualized are intact. No radiolucent lesions are noted.  IMPRESSION: Mild degenerative joint disease of the right shoulder. No acute abnormality.   Electronically Signed   By: PIvar DrapeM.D.   On: 11/06/2017 16:46  Results for orders placed or performed in visit on 09/13/17  Lipid panel  Result Value Ref Range   Cholesterol 110 <200 mg/dL   HDL 30 (L) >40 mg/dL   Triglycerides 48 <150 mg/dL   LDL Cholesterol (Calc) 66 mg/dL (calc)   Total CHOL/HDL Ratio 3.7 <5.0 (calc)   Non-HDL Cholesterol (Calc) 80 <130 mg/dL (calc)  PSA, Total with Reflex to PSA, Free  Result Value Ref Range   PSA, Total 0.2 < OR = 4.0 ng/mL  T4, free  Result Value Ref Range   Free T4 1.2 0.8 - 1.8 ng/dL  TSH  Result Value Ref Range   TSH 1.28 0.40 - 4.50 mIU/L  Hemoglobin A1c  Result Value Ref Range   Hgb A1c MFr Bld 5.1 <5.7 % of total Hgb   Mean Plasma Glucose 100 (calc)   eAG (mmol/L) 5.5 (calc)  Hepatitis C antibody  Result Value Ref Range   Hepatitis C Ab NON-REACTIVE NON-REACTI   SIGNAL TO CUT-OFF 0.06 <1.00      Assessment & Plan:   Problem List Items Addressed This Visit    None    Visit Diagnoses    Bursitis of right shoulder    -  Primary   Relevant  Medications   lidocaine (PF) (XYLOCAINE) 1 % injection 4 mL (Completed)   methylPREDNISolone acetate (DEPO-MEDROL) injection 40 mg (Completed)   Other Relevant Orders   DG Shoulder Right   Chronic right shoulder pain       Relevant Medications   lidocaine (PF) (XYLOCAINE) 1 % injection 4 mL (Completed)   methylPREDNISolone acetate (DEPO-MEDROL) injection 40 mg (Completed)   Other Relevant Orders   DG Shoulder Right      Consistent with gradual improving chronic R-shoulder bursitis vs rotator cuff tendinopathy with some reduced active ROM but without significant evidence of muscle tear (no weakness). - Prior similar history bursitis flare in past years ago, likely underlying arthritis - No imaging on chart Cannot take NSAID due to CKD  Plan: Right shoulder subacromial steroid injection performed today, see procedure note for details.  Check R Shoulder X-ray today for diagnostic - **UPDATE 11/06/17 530pm, updated results above, with x-ray finding of mild degenerative arthritis R shoulder and AC  joint, no acute abnormality, released to mychart, no change in plan  1. May take inc dose Tylenol Ex Str 1-2 q 6 hr PRN 3. Relative rest but keep shoulder mobile, demonstrated ROM exercises, avoid heavy lifting 4. May try heating pad PRN 5. Follow-up 4-6 weeks if not improved for re-evaluation, consider referral to Physical Therapy vs Ortho   Meds ordered this encounter  Medications  . lidocaine (PF) (XYLOCAINE) 1 % injection 4 mL  . methylPREDNISolone acetate (DEPO-MEDROL) injection 40 mg    Follow up plan: Return in about 6 weeks (around 12/18/2017), or if symptoms worsen or fail to improve, for Right Shoulder bursitis.  Nobie Putnam, [facility] 11/06/2017, 12:17 PM

## 2017-11-13 NOTE — Telephone Encounter (Signed)
error 

## 2017-11-27 DIAGNOSIS — C9001 Multiple myeloma in remission: Secondary | ICD-10-CM | POA: Diagnosis not present

## 2017-12-25 DIAGNOSIS — C9001 Multiple myeloma in remission: Secondary | ICD-10-CM | POA: Diagnosis not present

## 2017-12-25 DIAGNOSIS — Z23 Encounter for immunization: Secondary | ICD-10-CM | POA: Diagnosis not present

## 2017-12-25 DIAGNOSIS — G629 Polyneuropathy, unspecified: Secondary | ICD-10-CM | POA: Diagnosis not present

## 2018-01-22 DIAGNOSIS — C9001 Multiple myeloma in remission: Secondary | ICD-10-CM | POA: Diagnosis not present

## 2018-02-14 ENCOUNTER — Ambulatory Visit (INDEPENDENT_AMBULATORY_CARE_PROVIDER_SITE_OTHER): Payer: BLUE CROSS/BLUE SHIELD | Admitting: Family Medicine

## 2018-02-14 ENCOUNTER — Encounter: Payer: Self-pay | Admitting: Family Medicine

## 2018-02-14 VITALS — BP 105/56 | HR 52 | Temp 98.3°F | Resp 16 | Ht 72.0 in | Wt 279.6 lb

## 2018-02-14 DIAGNOSIS — M5441 Lumbago with sciatica, right side: Secondary | ICD-10-CM | POA: Diagnosis not present

## 2018-02-14 MED ORDER — BACLOFEN 10 MG PO TABS: 5.0000 mg | ORAL_TABLET | Freq: Three times a day (TID) | ORAL | 0 refills | Status: DC | PRN

## 2018-02-14 MED ORDER — NAPROXEN 500 MG PO TABS: 500.0000 mg | ORAL_TABLET | Freq: Two times a day (BID) | ORAL | 0 refills | Status: DC

## 2018-02-14 NOTE — Progress Notes (Signed)
Subjective:    Patient ID: Kyle Parker, male    DOB: 20-Apr-1956, 63 y.o.   MRN: [id number]  Kyle Parker is a 62 y.o. male presenting on 02/14/2018 for Back Pain (onset 2 weeks)  Patient presents for a same day appointment.  HPI   LOW BACK PAIN, Bilateral with RIght worse w/ Right sided sciatica Reports symptoms started about 2 weeks ago with gradual onset R side mid to lower back discomfort, then eventually affected both sides including Left. He did not have active injury that he was aware of. He did play golf recently and was doing alright but then later had worsening discomfort and stiffness and tightness in back.   Today seems to be worsening at times, overall symptoms mostly persistent. Describes pain as constant sore stiffness and occasional episodic tightening spasm pain moderate severity and some occasional sharper pain with nerve pain he describes radiating down RIght leg back of leg shooting pain.  - Not taking any Tylenol or NSAID for this. Not using - Taking Gabapentin 355m 4 x daily - Not tried heating pad at night, ice, muscle rub - History of known DJD in other joints and spine, no prior lumbar spine x-ray or imaging, he has complicated history with known Multiple Myeloma, now in remission on chemotherapy - Denies difficulty sleeping due to pain - Denies any fevers/chills, numbness, tingling, weakness, loss of control bladder/bowel incontinence or retention, unintentional wt loss, night sweats   [facility] 02/14/2018 10/19/2017 09/07/2017  Decreased Interest 0 0 0  Down, Depressed, Hopeless 0 0 0  PHQ - 2 Score 0 0 0    Social History   Tobacco Use  . Smoking status: Never Smoker  . Smokeless tobacco: Current User    Types: Chew  Substance Use Topics  . Alcohol use: No    Alcohol/week: 0.0 oz  . Drug use: No    Review of Systems Per HPI unless specifically indicated above     Objective:    BP (!) 105/56   Pulse (!) 52   Temp 98.3 F (36.8  C) (Oral)   Resp 16   Ht 6' (1.829 m)   Wt 279 lb 9.6 oz (126.8 kg)   BMI 37.92 kg/m   Wt Readings from Last 3 Encounters:  02/14/18 279 lb 9.6 oz (126.8 kg)  11/06/17 280 lb (127 kg)  10/31/17 277 lb (125.6 kg)    Physical Exam  Constitutional: He is oriented to person, place, and time. He appears well-developed and well-nourished. No distress.  Well-appearing, comfortable, cooperative  HENT:  Head: Normocephalic and atraumatic.  Mouth/Throat: Oropharynx is clear and moist.  Eyes: Conjunctivae are normal. Right eye exhibits no discharge. Left eye exhibits no discharge.  Neck: Normal range of motion. Neck supple. No thyromegaly present.  Cardiovascular: Normal rate, regular rhythm, normal heart sounds and intact distal pulses.  No murmur heard. Pulmonary/Chest: Effort normal and breath sounds normal. No respiratory distress. He has no wheezes. He has no rales.  Musculoskeletal:  Low Back Inspection: Normal appearance, Large body habitus, no spinal deformity, symmetrical. Palpation: No tenderness over spinous processes.  Bilateral lumbar paraspinal muscles mild discomfort R>L, with hypertonicity/spasm Right side > Left ROM: Slightly reduced active ROM L / R rotation, otherwise mostly intact flex / back extension without discomfort - Stiff with sitting up from seated position prolonged Special Testing: Seated SLR negative for radicular pain bilaterally Seated facet load test negative Strength: Bilateral hip flex/ext 5/5, knee flex/ext 5/5,  ankle dorsiflex/plantarflex 5/5 Neurovascular: intact distal sensation to light touch   Lymphadenopathy:    He has no cervical adenopathy.  Neurological: He is alert and oriented to person, place, and time.  Skin: Skin is warm and dry. No rash noted. He is not diaphoretic. No erythema.  Psychiatric: He has a normal mood and affect. His behavior is normal.  Well groomed, good eye contact, normal speech and thoughts  Nursing note and vitals  reviewed.  Results for orders placed or performed in visit on 09/13/17  Lipid panel  Result Value Ref Range   Cholesterol 110 <200 mg/dL   HDL 30 (L) >40 mg/dL   Triglycerides 48 <150 mg/dL   LDL Cholesterol (Calc) 66 mg/dL (calc)   Total CHOL/HDL Ratio 3.7 <5.0 (calc)   Non-HDL Cholesterol (Calc) 80 <130 mg/dL (calc)  PSA, Total with Reflex to PSA, Free  Result Value Ref Range   PSA, Total 0.2 < OR = 4.0 ng/mL  T4, free  Result Value Ref Range   Free T4 1.2 0.8 - 1.8 ng/dL  TSH  Result Value Ref Range   TSH 1.28 0.40 - 4.50 mIU/L  Hemoglobin A1c  Result Value Ref Range   Hgb A1c MFr Bld 5.1 <5.7 % of total Hgb   Mean Plasma Glucose 100 (calc)   eAG (mmol/L) 5.5 (calc)  Hepatitis C antibody  Result Value Ref Range   Hepatitis C Ab NON-REACTIVE NON-REACTI   SIGNAL TO CUT-OFF 0.06 <1.00      Assessment & Plan:   Problem List Items Addressed This Visit    None    Visit Diagnoses    Acute right-sided low back pain with right-sided sciatica    -  Primary   Relevant Medications   naproxen (NAPROSYN) 500 MG tablet   baclofen (LIORESAL) 10 MG tablet      Acute bilateral R>L LBP with associated episodic R sciatica. Suspect likely due to muscle spasm/strain, without known injury or trauma, but has increased activity including golf. - In setting of known chronic OA/DJD other sites, not confirmed lumbar - No red flag symptoms. Negative SLR for radiculopathy - Inadequate conservative therapy   Plan: 1. Start anti-inflammatory trial with rx Naprosyn 553m BID wc x 1-2 weeks, then PRN 2. Start muscle relaxant with Baclofen 139mtabs - take 5-1088mp to TID PRN, titrate up as tolerated 3. May use Tylenol PRN for breakthrough 4. Encouraged use of heating pad 1-2x daily for now then PRN, muscle rub PRN 5. Follow-up 1 month, re-evaluation. If not improved consider X-ray imaging, trial of PT for strengthening, or Ortho  Concern with history of Multiple Myeloma, if any abnormal  other symptoms or worsening / not improving, would consider sooner follow-up labs and return to Oncology for advanced imaging  Meds ordered this encounter  Medications  . naproxen (NAPROSYN) 500 MG tablet    Sig: Take 1 tablet (500 mg total) by mouth 2 (two) times daily with a meal. For 1-2 weeks then as needed    Dispense:  60 tablet    Refill:  0  . baclofen (LIORESAL) 10 MG tablet    Sig: Take 0.5-1 tablets (5-10 mg total) by mouth 3 (three) times daily as needed for muscle spasms.    Dispense:  30 each    Refill:  0     Follow up plan: Return in about 1 month (around 03/14/2018) for Back Pain .   AleNobie PutnamO [facility]errydical [facility]  02/14/2018, 4:45 PM

## 2018-02-14 NOTE — Patient Instructions (Addendum)
Thank you for coming to the office today.  1. For your Back Pain - I think that this is due to Muscle Spasms or strain. Your Sciatic Nerve can be affected causing some of your radiation and numbness down your legs. 2. Start with anti-inflammatory Naprosyn (Naproxen) 500mg  twice daily (12 hrs apart, with food, breakfast and dinner) every day for next 1 to 2 weeks if helping, then can use only as needed 3. Start Baclofen (Lioresal) 10mg  tablets - cut in half for 5mg  at night for muscle relaxant - may make you sedated or sleepy (be careful driving or working on this) if tolerated you can take every 8 hours, half or whole tab 4. May use Tylenol Extra Str 500mg  tabs - may take 1-2 tablets every 6 hours as needed 5. Recommend to start using heating pad on your lower back 1-2x daily for few weeks  This pain may take weeks to months to fully resolve, but hopefully it will respond to the medicine initially. All back injuries (small or serious) are slow to heal since we use our back muscles every day. Be careful with turning, twisting, lifting, sitting / standing for prolonged periods, and avoid re-injury.  If your symptoms significantly worsen with more pain, or new symptoms with weakness in one or both legs, new or different shooting leg pains, numbness in legs or groin, loss of control or retention of urine or bowel movements, please call back for advice and you may need to go directly to the Emergency Department.  Call if not improved by next week we can consider a Prednistone dose pak  No x-ray at this time, but if needed by week 4 we can  Please schedule a Follow-up Appointment to: Return in about 1 month (around 03/14/2018) for Back Pain .  If you have any other questions or concerns, please feel free to call the office or send a message through [place]. You may also schedule an earlier appointment if necessary.  Additionally, you may be receiving a survey about your experience at our office within a  few days to 1 week by e-mail or mail. We value your feedback.  Nobie Putnam, DO [facility], [facility]             Low Back Pain Exercises  See other page with pictures of each exercise.  Start with 1 or 2 of these exercises that you are most comfortable with. Do not do any exercises that cause you significant worsening pain. Some of these may cause some "stretching soreness" but it should go away after you stop the exercise, and get better over time. Gradually increase up to 3-4 exercises as tolerated.  Standing hamstring stretch: Place the heel of your leg on a stool about 15 inches high. Keep your knee straight. Lean forward, bending at the hips until you feel a mild stretch in the back of your thigh. Make sure you do not roll your shoulders and bend at the waist when doing this or you will stretch your lower back instead. Hold the stretch for 15 to 30 seconds. Repeat 3 times. Repeat the same stretch on your other leg.  Cat and camel: Get down on your hands and knees. Let your stomach sag, allowing your back to curve downward. Hold this position for 5 seconds. Then arch your back and hold for 5 seconds. Do 3 sets of 10.  Quadriped Arm/Leg Raises: Get down on your hands and knees. Tighten your abdominal muscles to stiffen  your spine. While keeping your abdominals tight, raise one arm and the opposite leg away from you. Hold this position for 5 seconds. Lower your arm and leg slowly and alternate sides. Do this 10 times on each side.  Pelvic tilt: Lie on your back with your knees bent and your feet flat on the floor. Tighten your abdominal muscles and push your lower back into the floor. Hold this position for 5 seconds, then relax. Do 3 sets of 10.  Partial curl: Lie on your back with your knees bent and your feet flat on the floor. Tighten your stomach muscles and flatten your back against the floor. Tuck your chin to your chest. With your hands stretched out in  front of you, curl your upper body forward until your shoulders clear the floor. Hold this position for 3 seconds. Don't hold your breath. It helps to breathe out as you lift your shoulders up. Relax. Repeat 10 times. Build to 3 sets of 10. To challenge yourself, clasp your hands behind your head and keep your elbows out to the side.  Lower trunk rotation: Lie on your back with your knees bent and your feet flat on the floor. Tighten your abdominal muscles and push your lower back into the floor. Keeping your shoulders down flat, gently rotate your legs to one side, then the other as far as you can. Repeat 10 to 20 times.  Single knee to chest stretch: Lie on your back with your legs straight out in front of you. Bring one knee up to your chest and grasp the back of your thigh. Pull your knee toward your chest, stretching your buttock muscle. Hold this position for 15 to 30 seconds and return to the starting position. Repeat 3 times on each side.  Double knee to chest: Lie on your back with your knees bent and your feet flat on the floor. Tighten your abdominal muscles and push your lower back into the floor. Pull both knees up to your chest. Hold for 5 seconds and repeat 10 to 20 times.

## 2018-02-19 DIAGNOSIS — Z23 Encounter for immunization: Secondary | ICD-10-CM | POA: Diagnosis not present

## 2018-02-19 DIAGNOSIS — C9001 Multiple myeloma in remission: Secondary | ICD-10-CM | POA: Diagnosis not present

## 2018-02-28 ENCOUNTER — Other Ambulatory Visit: Payer: Self-pay | Admitting: Family Medicine

## 2018-02-28 DIAGNOSIS — N138 Other obstructive and reflux uropathy: Secondary | ICD-10-CM

## 2018-02-28 DIAGNOSIS — N401 Enlarged prostate with lower urinary tract symptoms: Principal | ICD-10-CM

## 2018-03-13 ENCOUNTER — Other Ambulatory Visit: Payer: Self-pay | Admitting: Family Medicine

## 2018-03-13 DIAGNOSIS — M5441 Lumbago with sciatica, right side: Secondary | ICD-10-CM

## 2018-03-19 DIAGNOSIS — G629 Polyneuropathy, unspecified: Secondary | ICD-10-CM | POA: Diagnosis not present

## 2018-03-19 DIAGNOSIS — Z9484 Stem cells transplant status: Secondary | ICD-10-CM | POA: Diagnosis not present

## 2018-03-19 DIAGNOSIS — C9001 Multiple myeloma in remission: Secondary | ICD-10-CM | POA: Diagnosis not present

## 2018-04-11 ENCOUNTER — Ambulatory Visit (INDEPENDENT_AMBULATORY_CARE_PROVIDER_SITE_OTHER): Payer: BLUE CROSS/BLUE SHIELD | Admitting: Family Medicine

## 2018-04-11 ENCOUNTER — Encounter: Payer: Self-pay | Admitting: Family Medicine

## 2018-04-11 VITALS — BP 129/89 | HR 75 | Temp 98.3°F | Resp 16 | Ht 72.0 in | Wt 280.0 lb

## 2018-04-11 DIAGNOSIS — J309 Allergic rhinitis, unspecified: Secondary | ICD-10-CM | POA: Diagnosis not present

## 2018-04-11 DIAGNOSIS — N138 Other obstructive and reflux uropathy: Secondary | ICD-10-CM | POA: Diagnosis not present

## 2018-04-11 DIAGNOSIS — N401 Enlarged prostate with lower urinary tract symptoms: Secondary | ICD-10-CM | POA: Diagnosis not present

## 2018-04-11 MED ORDER — RAPAFLO 4 MG PO CAPS: 4.0000 mg | ORAL_CAPSULE | Freq: Every day | ORAL | 5 refills | Status: DC

## 2018-04-11 MED ORDER — IPRATROPIUM BROMIDE 0.06 % NA SOLN: 2.0000 | Freq: Four times a day (QID) | NASAL | 0 refills | Status: DC

## 2018-04-11 NOTE — Progress Notes (Signed)
Subjective:    Patient ID: Kyle Parker, male    DOB: Jun 04, 1956, 62 y.o.   MRN: [id number]  Kyle Parker is a 62 y.o. male presenting on 04/11/2018 for Sinusitis (congestion, low grade fever onset 6 days)   HPI   SINUSITIS, Allergic Onset symptoms about 5 days ago, Saturday night, he states that the day before possible trigger was cleaning the lawnmower outside and he breathed in a lot of dust and grass clippings and debris, he has had this similar problem before triggered by environmental allergens, he got a mask but it was too late. He felt onset symptoms with sore throat and sinus congestion. Taking OTC DayQuil and NyQuil with mild relief, seems to overall feel a little better now he has rested more. - Also he temporarily, holding Revlimid per Oncology due to possible infectious etiology, they will return to Oncology next week to determine when to restart - Admits low grade temp up to 72F to 100F, few times - Admits nasal congestion - Denies any nausea vomiting, abdominal pain, sinus pain and pressure, cough, dyspnea, rash  BPH Last visit 10/2017 for same problem, he has been stable on Rapaflo 4mg  in past. Today he reports that last fill was for generic and it has not been as effective for him, he has worsening LUTS and more nocturia. He is requesting to have rx for brand name.  Health Maintenance: Due for Flu Shot  Depression screen [facility] 2/9 04/11/2018 02/14/2018 10/19/2017  Decreased Interest 0 0 0  Down, Depressed, Hopeless 0 0 0  PHQ - 2 Score 0 0 0    Social History   Tobacco Use  . Smoking status: Never Smoker  . Smokeless tobacco: Current User    Types: Chew  Substance Use Topics  . Alcohol use: No    Alcohol/week: 0.0 standard drinks  . Drug use: No    Review of Systems Per HPI unless specifically indicated above     Objective:    BP 129/89   Pulse 75   Temp 98.3 F (36.8 C) (Oral)   Resp 16   Ht 6' (1.829 m)   Wt 280 lb (127 kg)   SpO2 99%   BMI 37.97  kg/m   Wt Readings from Last 3 Encounters:  04/11/18 280 lb (127 kg)  02/14/18 279 lb 9.6 oz (126.8 kg)  11/06/17 280 lb (127 kg)    Physical Exam  Constitutional: He is oriented to person, place, and time. He appears well-developed and well-nourished. No distress.  Mostly well appearing seems congested, comfortable, cooperative  HENT:  Head: Normocephalic and atraumatic.  Mouth/Throat: Oropharynx is clear and moist.  Frontal / maxillary sinuses non-tender. Nares with some turbinate edema and congestion without purulence. Bilateral TMs clear without erythema, effusion or bulging -  Oropharynx clear without erythema, exudates, edema or asymmetry.  Eyes: Conjunctivae are normal. Right eye exhibits no discharge. Left eye exhibits no discharge.  Neck: Normal range of motion. Neck supple. No thyromegaly present.  Cardiovascular: Normal rate, regular rhythm, normal heart sounds and intact distal pulses.  No murmur heard. Pulmonary/Chest: Effort normal and breath sounds normal. No respiratory distress. He has no wheezes. He has no rales.  Musculoskeletal: Normal range of motion. He exhibits no edema.  Lymphadenopathy:    He has no cervical adenopathy.  Neurological: He is alert and oriented to person, place, and time.  Skin: Skin is warm and dry. No rash noted. He is not diaphoretic. No erythema.  Psychiatric:  He has a normal mood and affect. His behavior is normal.  Well groomed, good eye contact, normal speech and thoughts  Nursing note and vitals reviewed.  Results for orders placed or performed in visit on 09/13/17  Lipid panel  Result Value Ref Range   Cholesterol 110 <200 mg/dL   HDL 30 (L) >40 mg/dL   Triglycerides 48 <150 mg/dL   LDL Cholesterol (Calc) 66 mg/dL (calc)   Total CHOL/HDL Ratio 3.7 <5.0 (calc)   Non-HDL Cholesterol (Calc) 80 <130 mg/dL (calc)  PSA, Total with Reflex to PSA, Free  Result Value Ref Range   PSA, Total 0.2 < OR = 4.0 ng/mL  T4, free  Result Value  Ref Range   Free T4 1.2 0.8 - 1.8 ng/dL  TSH  Result Value Ref Range   TSH 1.28 0.40 - 4.50 mIU/L  Hemoglobin A1c  Result Value Ref Range   Hgb A1c MFr Bld 5.1 <5.7 % of total Hgb   Mean Plasma Glucose 100 (calc)   eAG (mmol/L) 5.5 (calc)  Hepatitis C antibody  Result Value Ref Range   Hepatitis C Ab NON-REACTIVE NON-REACTI   SIGNAL TO CUT-OFF 0.06 <1.00      Assessment & Plan:   Problem List Items Addressed This Visit    BPH with obstruction/lower urinary tract symptoms Clinically worse LUTS after switch from brand Rapaflo to generic Silodosin  Will send new rx brand Rapaflo to pharmacy, check cost if too high may need to change or adjust dose or consider Urology    Relevant Medications   RAPAFLO 4 MG CAPS capsule    Other Visit Diagnoses    Allergic sinusitis    -  Primary   Relevant Medications   ipratropium (ATROVENT) 0.06 % nasal spray      Consistent with acute rhinosinusitis, likely initially allergic rhinitis component with "hay fever" without evidence of bacterial infection, seems to be gradually improving.  Plan: 1. Reassurance, likely self-limited - no indication for antibiotics at this time 2. Start Atrovent nasal spray decongestant 2 sprays in each nostril up to 4 times daily for 7 days 3. Start OTC cetirizine 10mg  daily - can use PRN in future 4. Nasal saline supportive care handout per AVS 5. Future if not improved would Start nasal steroid Flonase 2 sprays in each nostril daily for 4-6 weeks, may repeat course seasonally or as needed 6. Follow-up as needed / return criteria given if worse or concern sign of infection contact within 24 hours or next week consider antibiotic course   Meds ordered this encounter  Medications  . ipratropium (ATROVENT) 0.06 % nasal spray    Sig: Place 2 sprays into both nostrils 4 (four) times daily. As needed. For up to 5-7 days then stop.    Dispense:  15 mL    Refill:  0  . RAPAFLO 4 MG CAPS capsule    Sig: Take 1  capsule (4 mg total) by mouth daily with breakfast.    Dispense:  30 capsule    Refill:  5    Follow up plan: Return in about 1 week (around 04/18/2018), or if symptoms worsen or fail to improve, for sinusitis.  Nobie Putnam, [facility] 04/11/2018, 5:12 PM

## 2018-04-11 NOTE — Patient Instructions (Addendum)
Thank you for coming to the office today.  1. It sounds like you have persistent Sinus Congestion or "Rhinosinusitis" - I do not think that this is a Bacterial Sinus Infection. Usually these are caused by Viruses or Allergies, and will run it's course in about 7 to 10 days. - No antibiotics are needed at this time  Start Atrovent nasal spray decongestant 2 sprays in each nostril up to 4 times daily for 7 days Start Zyrtec (cetirizine) 10g daily If not improving or return symptoms or lingering sinus congestion after 1-2 weeks, can start OTC or call us for rx Flonase 2 sprays in each nostril daily for next 4-6 weeks, then you may stop and use seasonally or as needed - Recommend to may try using Nasal Saline spray multiple times a day to help flush out congestion and clear sinuses - Improve hydration by drinking plenty of clear fluids (water, gatorade) to reduce secretions and thin congestion - Congestion draining down throat can cause irritation. May try warm herbal tea with honey, cough drops - Can take Tylenol or Ibuprofen as needed for fevers  WITHIN 24 HOURS NOTIFY us IF WORSENING THEN WE CAN CONSIDER ANTIBIOTICS  Sent brand name Rapaflo to pharmacy.  If you develop persistent fever >101F for at least 3 consecutive days, headaches with sinus pain or pressure or persistent earache, please schedule a follow-up evaluation within next few days to week.   Please schedule a Follow-up Appointment to: Return in about 1 week (around 04/18/2018), or if symptoms worsen or fail to improve, for sinusitis.  If you have any other questions or concerns, please feel free to call the office or send a message through [place]. You may also schedule an earlier appointment if necessary.  Additionally, you may be receiving a survey about your experience at our office within a few days to 1 week by e-mail or mail. We value your feedback.  Nobie Putnam, DO [place] [facility]

## 2018-04-15 ENCOUNTER — Telehealth: Payer: Self-pay | Admitting: Family Medicine

## 2018-04-15 DIAGNOSIS — R112 Nausea with vomiting, unspecified: Secondary | ICD-10-CM

## 2018-04-15 DIAGNOSIS — J0110 Acute frontal sinusitis, unspecified: Secondary | ICD-10-CM

## 2018-04-15 MED ORDER — ONDANSETRON 4 MG PO TBDP: 4.0000 mg | ORAL_TABLET | Freq: Three times a day (TID) | ORAL | 0 refills | Status: DC | PRN

## 2018-04-15 MED ORDER — AMOXICILLIN-POT CLAVULANATE 875-125 MG PO TABS: 1.0000 | ORAL_TABLET | Freq: Two times a day (BID) | ORAL | 0 refills | Status: DC

## 2018-04-15 NOTE — Telephone Encounter (Signed)
Last seen 04/11/18, see note. Treated for allergic rhinosinusitis at that time. If not improved advised to contact us back, now >7 days, with still low grade fever, also nausea vomiting episode x 1 only, and persistent sinusitis symptoms. Limited productive cough. Will send rx Zofran PRN nausea and treat him with Augmentin course for sinusitis as discussed previously as our back up plan. Return criteria reviewed, follow-up plan if not improve.  Nobie Putnam, [facility] 04/15/2018, 8:28 AM

## 2018-04-16 DIAGNOSIS — C9000 Multiple myeloma not having achieved remission: Secondary | ICD-10-CM | POA: Diagnosis not present

## 2018-04-16 DIAGNOSIS — C9001 Multiple myeloma in remission: Secondary | ICD-10-CM | POA: Diagnosis not present

## 2018-04-16 DIAGNOSIS — J181 Lobar pneumonia, unspecified organism: Secondary | ICD-10-CM | POA: Diagnosis not present

## 2018-04-16 DIAGNOSIS — R0602 Shortness of breath: Secondary | ICD-10-CM | POA: Diagnosis not present

## 2018-04-16 DIAGNOSIS — R918 Other nonspecific abnormal finding of lung field: Secondary | ICD-10-CM | POA: Diagnosis not present

## 2018-04-25 ENCOUNTER — Other Ambulatory Visit: Payer: Self-pay | Admitting: Family Medicine

## 2018-04-25 ENCOUNTER — Telehealth: Payer: Self-pay | Admitting: Family Medicine

## 2018-04-25 DIAGNOSIS — M5441 Lumbago with sciatica, right side: Secondary | ICD-10-CM

## 2018-04-25 DIAGNOSIS — C9000 Multiple myeloma not having achieved remission: Secondary | ICD-10-CM | POA: Diagnosis not present

## 2018-04-25 DIAGNOSIS — G620 Drug-induced polyneuropathy: Secondary | ICD-10-CM | POA: Diagnosis not present

## 2018-04-25 DIAGNOSIS — J181 Lobar pneumonia, unspecified organism: Secondary | ICD-10-CM | POA: Diagnosis not present

## 2018-04-25 DIAGNOSIS — C9001 Multiple myeloma in remission: Secondary | ICD-10-CM | POA: Diagnosis not present

## 2018-04-25 MED ORDER — BACLOFEN 10 MG PO TABS: 5.0000 mg | ORAL_TABLET | Freq: Three times a day (TID) | ORAL | 2 refills | Status: DC | PRN

## 2018-04-25 NOTE — Telephone Encounter (Signed)
Pt. Called requesting refill on Baclofen called into [facility].

## 2018-05-14 DIAGNOSIS — C9000 Multiple myeloma not having achieved remission: Secondary | ICD-10-CM | POA: Diagnosis not present

## 2018-05-14 DIAGNOSIS — C9001 Multiple myeloma in remission: Secondary | ICD-10-CM | POA: Diagnosis not present

## 2018-06-11 DIAGNOSIS — C9000 Multiple myeloma not having achieved remission: Secondary | ICD-10-CM | POA: Diagnosis not present

## 2018-06-11 DIAGNOSIS — Z8701 Personal history of pneumonia (recurrent): Secondary | ICD-10-CM | POA: Diagnosis not present

## 2018-06-11 DIAGNOSIS — C9001 Multiple myeloma in remission: Secondary | ICD-10-CM | POA: Diagnosis not present

## 2018-06-11 DIAGNOSIS — R0781 Pleurodynia: Secondary | ICD-10-CM | POA: Diagnosis not present

## 2018-06-11 DIAGNOSIS — R05 Cough: Secondary | ICD-10-CM | POA: Diagnosis not present

## 2018-07-10 DIAGNOSIS — Z23 Encounter for immunization: Secondary | ICD-10-CM | POA: Diagnosis not present

## 2018-07-10 DIAGNOSIS — D485 Neoplasm of uncertain behavior of skin: Secondary | ICD-10-CM | POA: Diagnosis not present

## 2018-07-10 DIAGNOSIS — L570 Actinic keratosis: Secondary | ICD-10-CM | POA: Diagnosis not present

## 2018-07-10 DIAGNOSIS — C44629 Squamous cell carcinoma of skin of left upper limb, including shoulder: Secondary | ICD-10-CM | POA: Diagnosis not present

## 2018-07-10 DIAGNOSIS — L814 Other melanin hyperpigmentation: Secondary | ICD-10-CM | POA: Diagnosis not present

## 2018-08-05 ENCOUNTER — Other Ambulatory Visit: Payer: Self-pay

## 2018-08-05 ENCOUNTER — Inpatient Hospital Stay
Admission: EM | Admit: 2018-08-05 | Discharge: 2018-08-12 | DRG: 871 | Disposition: A | Discharge status: Home / Self Care | Payer: BLUE CROSS/BLUE SHIELD | Attending: Internal Medicine | Admitting: Internal Medicine

## 2018-08-05 ENCOUNTER — Emergency Department: Payer: BLUE CROSS/BLUE SHIELD

## 2018-08-05 DIAGNOSIS — Z8701 Personal history of pneumonia (recurrent): Secondary | ICD-10-CM | POA: Diagnosis not present

## 2018-08-05 DIAGNOSIS — J9600 Acute respiratory failure, unspecified whether with hypoxia or hypercapnia: Secondary | ICD-10-CM

## 2018-08-05 DIAGNOSIS — G620 Drug-induced polyneuropathy: Secondary | ICD-10-CM | POA: Diagnosis present

## 2018-08-05 DIAGNOSIS — Z8249 Family history of ischemic heart disease and other diseases of the circulatory system: Secondary | ICD-10-CM

## 2018-08-05 DIAGNOSIS — R5081 Fever presenting with conditions classified elsewhere: Secondary | ICD-10-CM | POA: Diagnosis not present

## 2018-08-05 DIAGNOSIS — Z9481 Bone marrow transplant status: Secondary | ICD-10-CM

## 2018-08-05 DIAGNOSIS — R6521 Severe sepsis with septic shock: Secondary | ICD-10-CM | POA: Diagnosis present

## 2018-08-05 DIAGNOSIS — Z0189 Encounter for other specified special examinations: Secondary | ICD-10-CM

## 2018-08-05 DIAGNOSIS — R6520 Severe sepsis without septic shock: Secondary | ICD-10-CM | POA: Diagnosis not present

## 2018-08-05 DIAGNOSIS — Z792 Long term (current) use of antibiotics: Secondary | ICD-10-CM | POA: Diagnosis not present

## 2018-08-05 DIAGNOSIS — J189 Pneumonia, unspecified organism: Secondary | ICD-10-CM | POA: Diagnosis not present

## 2018-08-05 DIAGNOSIS — N400 Enlarged prostate without lower urinary tract symptoms: Secondary | ICD-10-CM | POA: Diagnosis not present

## 2018-08-05 DIAGNOSIS — N138 Other obstructive and reflux uropathy: Secondary | ICD-10-CM | POA: Diagnosis not present

## 2018-08-05 DIAGNOSIS — A419 Sepsis, unspecified organism: Secondary | ICD-10-CM

## 2018-08-05 DIAGNOSIS — E874 Mixed disorder of acid-base balance: Secondary | ICD-10-CM | POA: Diagnosis not present

## 2018-08-05 DIAGNOSIS — Z8051 Family history of malignant neoplasm of kidney: Secondary | ICD-10-CM

## 2018-08-05 DIAGNOSIS — Z9911 Dependence on respirator [ventilator] status: Secondary | ICD-10-CM | POA: Diagnosis not present

## 2018-08-05 DIAGNOSIS — C9001 Multiple myeloma in remission: Secondary | ICD-10-CM

## 2018-08-05 DIAGNOSIS — M4852XD Collapsed vertebra, not elsewhere classified, cervical region, subsequent encounter for fracture with routine healing: Secondary | ICD-10-CM | POA: Diagnosis not present

## 2018-08-05 DIAGNOSIS — D61818 Other pancytopenia: Secondary | ICD-10-CM | POA: Diagnosis not present

## 2018-08-05 DIAGNOSIS — N170 Acute kidney failure with tubular necrosis: Secondary | ICD-10-CM | POA: Diagnosis not present

## 2018-08-05 DIAGNOSIS — M8448XD Pathological fracture, other site, subsequent encounter for fracture with routine healing: Secondary | ICD-10-CM | POA: Diagnosis not present

## 2018-08-05 DIAGNOSIS — N401 Enlarged prostate with lower urinary tract symptoms: Secondary | ICD-10-CM | POA: Diagnosis present

## 2018-08-05 DIAGNOSIS — C9000 Multiple myeloma not having achieved remission: Secondary | ICD-10-CM | POA: Diagnosis not present

## 2018-08-05 DIAGNOSIS — J9601 Acute respiratory failure with hypoxia: Secondary | ICD-10-CM | POA: Diagnosis not present

## 2018-08-05 DIAGNOSIS — D649 Anemia, unspecified: Secondary | ICD-10-CM | POA: Diagnosis not present

## 2018-08-05 DIAGNOSIS — Z978 Presence of other specified devices: Secondary | ICD-10-CM

## 2018-08-05 DIAGNOSIS — Z833 Family history of diabetes mellitus: Secondary | ICD-10-CM

## 2018-08-05 DIAGNOSIS — D709 Neutropenia, unspecified: Secondary | ICD-10-CM | POA: Diagnosis not present

## 2018-08-05 DIAGNOSIS — M899 Disorder of bone, unspecified: Secondary | ICD-10-CM | POA: Diagnosis not present

## 2018-08-05 DIAGNOSIS — N179 Acute kidney failure, unspecified: Secondary | ICD-10-CM | POA: Diagnosis not present

## 2018-08-05 DIAGNOSIS — Z808 Family history of malignant neoplasm of other organs or systems: Secondary | ICD-10-CM

## 2018-08-05 DIAGNOSIS — E876 Hypokalemia: Secondary | ICD-10-CM | POA: Diagnosis present

## 2018-08-05 DIAGNOSIS — J13 Pneumonia due to Streptococcus pneumoniae: Secondary | ICD-10-CM | POA: Diagnosis not present

## 2018-08-05 DIAGNOSIS — R0602 Shortness of breath: Secondary | ICD-10-CM | POA: Diagnosis not present

## 2018-08-05 DIAGNOSIS — Z791 Long term (current) use of non-steroidal anti-inflammatories (NSAID): Secondary | ICD-10-CM | POA: Diagnosis not present

## 2018-08-05 DIAGNOSIS — Z7982 Long term (current) use of aspirin: Secondary | ICD-10-CM | POA: Diagnosis not present

## 2018-08-05 DIAGNOSIS — Z79899 Other long term (current) drug therapy: Secondary | ICD-10-CM

## 2018-08-05 DIAGNOSIS — E870 Hyperosmolality and hypernatremia: Secondary | ICD-10-CM | POA: Diagnosis present

## 2018-08-05 DIAGNOSIS — Z4682 Encounter for fitting and adjustment of non-vascular catheter: Secondary | ICD-10-CM | POA: Diagnosis not present

## 2018-08-05 DIAGNOSIS — A403 Sepsis due to Streptococcus pneumoniae: Secondary | ICD-10-CM | POA: Diagnosis not present

## 2018-08-05 DIAGNOSIS — Z981 Arthrodesis status: Secondary | ICD-10-CM | POA: Diagnosis not present

## 2018-08-05 DIAGNOSIS — D696 Thrombocytopenia, unspecified: Secondary | ICD-10-CM | POA: Diagnosis not present

## 2018-08-05 DIAGNOSIS — J181 Lobar pneumonia, unspecified organism: Secondary | ICD-10-CM | POA: Diagnosis not present

## 2018-08-05 LAB — COMPREHENSIVE METABOLIC PANEL
ALBUMIN: 3.5 g/dL (ref 3.5–5.0)
ALT: 19 U/L (ref 0–44)
ANION GAP: 12 (ref 5–15)
AST: 20 U/L (ref 15–41)
Alkaline Phosphatase: 28 U/L — ABNORMAL LOW (ref 38–126)
BUN: 36 mg/dL — ABNORMAL HIGH (ref 8–23)
CO2: 20 mmol/L — AB (ref 22–32)
Calcium: 8.4 mg/dL — ABNORMAL LOW (ref 8.9–10.3)
Chloride: 103 mmol/L (ref 98–111)
Creatinine, Ser: 3.99 mg/dL — ABNORMAL HIGH (ref 0.61–1.24)
GFR calc Af Amer: 18 mL/min — ABNORMAL LOW (ref >60)
GFR calc non Af Amer: 15 mL/min — ABNORMAL LOW (ref >60)
GLUCOSE: 140 mg/dL — AB (ref 70–99)
POTASSIUM: 4 mmol/L (ref 3.5–5.1)
SODIUM: 135 mmol/L (ref 135–145)
Total Bilirubin: 1.6 mg/dL — ABNORMAL HIGH (ref 0.3–1.2)
Total Protein: 7.4 g/dL (ref 6.5–8.1)

## 2018-08-05 LAB — CBC WITH DIFFERENTIAL/PLATELET
ABS IMMATURE GRANULOCYTES: 0.02 10*3/uL (ref 0.00–0.07)
BASOS ABS: 0 10*3/uL (ref 0.0–0.1)
BASOS PCT: 1 %
Eosinophils Absolute: 0 10*3/uL (ref 0.0–0.5)
Eosinophils Relative: 2 %
HCT: 37.6 % — ABNORMAL LOW (ref 39.0–52.0)
Hemoglobin: 12.6 g/dL — ABNORMAL LOW (ref 13.0–17.0)
Immature Granulocytes: 2 %
LYMPHS ABS: 0.5 10*3/uL — AB (ref 0.7–4.0)
Lymphocytes Relative: 47 %
MCH: 32.1 pg (ref 26.0–34.0)
MCHC: 33.5 g/dL (ref 30.0–36.0)
MCV: 95.9 fL (ref 80.0–100.0)
MONOS PCT: 14 %
Monocytes Absolute: 0.2 10*3/uL (ref 0.1–1.0)
NEUTROS ABS: 0.4 10*3/uL — AB (ref 1.7–7.7)
NEUTROS PCT: 34 %
PLATELETS: 92 10*3/uL — AB (ref 150–400)
RBC: 3.92 MIL/uL — ABNORMAL LOW (ref 4.22–5.81)
RDW: 14.6 % (ref 11.5–15.5)
SMEAR REVIEW: NORMAL
WBC: 1 10*3/uL — CL (ref 4.0–10.5)
nRBC: 0 % (ref 0.0–0.2)

## 2018-08-05 LAB — TROPONIN I: Troponin I: <0.03 ng/mL (ref <0.03)

## 2018-08-05 LAB — BRAIN NATRIURETIC PEPTIDE: B NATRIURETIC PEPTIDE 5: 478 pg/mL — AB (ref 0.0–100.0)

## 2018-08-05 LAB — INFLUENZA PANEL BY PCR (TYPE A & B)
INFLAPCR: NEGATIVE
INFLBPCR: NEGATIVE

## 2018-08-05 LAB — LACTIC ACID, PLASMA: LACTIC ACID, VENOUS: 3.8 mmol/L — AB (ref 0.5–1.9)

## 2018-08-05 MED ORDER — ACETAMINOPHEN 500 MG PO TABS
ORAL_TABLET | ORAL | Status: AC
  Administered 2018-08-05: 23:00:00
  Filled 2018-08-05: qty 2

## 2018-08-05 MED ORDER — NOREPINEPHRINE BITARTRATE 1 MG/ML IV SOLN
0.0000 ug/min | INTRAVENOUS | Status: DC
  Administered 2018-08-06: 10 ug/min via INTRAVENOUS
  Administered 2018-08-06: 2 ug/min via INTRAVENOUS
  Administered 2018-08-06 – 2018-08-07 (×2): 12 ug/min via INTRAVENOUS
  Administered 2018-08-07: 8 ug/min via INTRAVENOUS
  Administered 2018-08-07: 14 ug/min via INTRAVENOUS
  Administered 2018-08-07: 11 ug/min via INTRAVENOUS
  Administered 2018-08-07: 6 ug/min via INTRAVENOUS
  Filled 2018-08-05 (×7): qty 4

## 2018-08-05 MED ORDER — SODIUM CHLORIDE 0.9 % IV SOLN
500.0000 mg | Freq: Once | INTRAVENOUS | Status: AC
  Administered 2018-08-05: 500 mg via INTRAVENOUS
  Filled 2018-08-05: qty 500

## 2018-08-05 MED ORDER — VANCOMYCIN HCL IN DEXTROSE 1-5 GM/200ML-% IV SOLN
1000.0000 mg | Freq: Once | INTRAVENOUS | Status: AC
  Administered 2018-08-05: 1000 mg via INTRAVENOUS
  Filled 2018-08-05: qty 200

## 2018-08-05 MED ORDER — SODIUM CHLORIDE 0.9 % IV SOLN: 1.0000 g | Freq: Once | INTRAVENOUS | Status: DC

## 2018-08-05 MED ORDER — SODIUM CHLORIDE 0.9 % IV BOLUS
1000.0000 mL | Freq: Once | INTRAVENOUS | Status: AC
  Administered 2018-08-05: 1000 mL via INTRAVENOUS

## 2018-08-05 MED ORDER — AZITHROMYCIN 500 MG IV SOLR: 500.0000 mg | Freq: Once | INTRAVENOUS | Status: DC

## 2018-08-05 MED ORDER — SODIUM CHLORIDE 0.9 % IV SOLN
2.0000 g | Freq: Once | INTRAVENOUS | Status: AC
  Administered 2018-08-05: 2 g via INTRAVENOUS
  Filled 2018-08-05: qty 2

## 2018-08-05 MED ORDER — IPRATROPIUM-ALBUTEROL 0.5-2.5 (3) MG/3ML IN SOLN
RESPIRATORY_TRACT | Status: AC
  Administered 2018-08-05: 23:00:00
  Filled 2018-08-05: qty 3

## 2018-08-05 MED ORDER — ALBUTEROL SULFATE (2.5 MG/3ML) 0.083% IN NEBU
5.0000 mg | INHALATION_SOLUTION | Freq: Once | RESPIRATORY_TRACT | Status: AC
  Administered 2018-08-05: 5 mg via RESPIRATORY_TRACT
  Filled 2018-08-05: qty 6

## 2018-08-05 NOTE — ED Notes (Signed)
MD McShane at bedside  

## 2018-08-05 NOTE — H&P (Signed)
[place] [facility] [place] NAME: Kyle Parker    MR#:  [id number]  DATE OF BIRTH:  [birth year]  DATE OF ADMISSION:  08/05/2018  PRIMARY CARE PHYSICIAN: Olin Hauser, DO   REQUESTING/REFERRING PHYSICIAN: Burlene Arnt, MD  CHIEF COMPLAINT:   Chief Complaint  Patient presents with  . Shortness of Breath    HISTORY OF PRESENT ILLNESS:  Kyle Parker  is a 62 y.o. male who presents with chief complaint as above.  Patient presents with 2 days of malaise, and significant shortness of breath starting today.  He has a history of multiple myeloma and has been taking Revlimid at maintenance dose.  On evaluation in the ED today he is found to have multifocal pneumonia.  He is neutropenic with a total white count of 1 and an ANC of 100.  Patient states that he has been febrile at home.  He meets sepsis criteria, severe sepsis given his AKI.  His blood pressure began to drop in the ED, but stabilized with IV fluids.  Hospitalist were called for admission  PAST MEDICAL HISTORY:   Past Medical History:  Diagnosis Date  . Arthritis   . Compression fracture of cervical spine at C2-C3 level    Rods in neck. limited L/R movement  . Lytic bone lesions on xray   . Multiple myeloma without remission ([place])      PAST SURGICAL HISTORY:   Past Surgical History:  Procedure Laterality Date  . biopsy of the vertebral body     . COLONOSCOPY WITH PROPOFOL N/A 10/31/2017   Procedure: COLONOSCOPY WITH PROPOFOL;  Surgeon: Lin Landsman, MD;  Location: [facility];  Service: Endoscopy;  Laterality: N/A;  . CT GUIDED BONE BIOPSY  03/23/2016  . NECK SURGERY    . POLYPECTOMY  10/31/2017   Procedure: POLYPECTOMY;  Surgeon: Lin Landsman, MD;  Location: [facility];  Service: Endoscopy;;     SOCIAL HISTORY:   Social History   Tobacco Use  . Smoking status: Never Smoker  . Smokeless tobacco: Current User    Types: Chew  Substance  Use Topics  . Alcohol use: No    Alcohol/week: 0.0 standard drinks     FAMILY HISTORY:   Family History  Problem Relation Age of Onset  . Heart attack Father   . Renal cancer Father   . CAD Mother   . Diabetes Mellitus II Mother   . Heart disease Mother   . Diabetes Mother   . Skin cancer Mother        non melanoma  . Prostate cancer Neg Hx   . Colon cancer Neg Hx     DRUG ALLERGIES:  No Known Allergies  MEDICATIONS AT HOME:   Prior to Admission medications   Medication Sig Start Date End Date Taking? Authorizing Provider  acyclovir (ZOVIRAX) 400 MG tablet Take 1 tablet (400 mg total) by mouth 2 (two) times daily. 03/21/16  Yes Cammie Sickle, MD  aspirin EC 81 MG tablet Take 162 mg by mouth daily.    Yes [provider]  gabapentin (NEURONTIN) 300 MG capsule Take 600 mg by mouth 3 (three) times daily.   Yes [provider]  omeprazole (PRILOSEC) 20 MG capsule Take 20 mg by mouth daily.   Yes [provider]  RAPAFLO 4 MG CAPS capsule Take 1 capsule (4 mg total) by mouth daily with breakfast. 04/11/18  Yes Karamalegos, Devonne Doughty, DO  Vitamin D, Ergocalciferol, (  DRISDOL) 50000 units CAPS capsule Take 50,000 Units by mouth every Tuesday.    Yes [provider]  amoxicillin-clavulanate (AUGMENTIN) 875-125 MG tablet Take 1 tablet by mouth 2 (two) times daily. For 10 days Patient not taking: Reported on 08/05/2018 04/15/18   Olin Hauser, DO  baclofen (LIORESAL) 10 MG tablet Take 0.5-1 tablets (5-10 mg total) by mouth 3 (three) times daily as needed for muscle spasms. Patient not taking: Reported on 08/05/2018 04/25/18   Olin Hauser, DO  ipratropium (ATROVENT) 0.06 % nasal spray Place 2 sprays into both nostrils 4 (four) times daily. As needed. For up to 5-7 days then stop. Patient not taking: Reported on 08/05/2018 04/11/18   Olin Hauser, DO  lenalidomide (REVLIMID) 25 MG capsule Take one capsule daily on  days 1-14 every 21 days. Patient not taking: Reported on 08/05/2018 03/21/16   Cammie Sickle, MD  naproxen (NAPROSYN) 500 MG tablet TAKE 1 TABLET (500 MG TOTAL) BY MOUTH 2 (TWO) TIMES DAILY WITH A MEAL. FOR 1-2 WEEKS THEN AS NEEDED Patient not taking: Reported on 08/05/2018 03/13/18   Olin Hauser, DO  ondansetron (ZOFRAN ODT) 4 MG disintegrating tablet Take 1 tablet (4 mg total) by mouth every 8 (eight) hours as needed for nausea or vomiting. Patient not taking: Reported on 08/05/2018 04/15/18   Olin Hauser, DO  REVLIMID 10 MG capsule Take 10 mg by mouth daily.  10/25/17   [provider]    REVIEW OF SYSTEMS:  Review of Systems  Constitutional: Positive for chills, fever and malaise/fatigue. Negative for weight loss.  HENT: Negative for ear pain, hearing loss and tinnitus.   Eyes: Negative for blurred vision, double vision, pain and redness.  Respiratory: Positive for cough and shortness of breath. Negative for hemoptysis.   Cardiovascular: Negative for chest pain, palpitations, orthopnea and leg swelling.  Gastrointestinal: Negative for abdominal pain, constipation, diarrhea, nausea and vomiting.  Genitourinary: Negative for dysuria, frequency and hematuria.  Musculoskeletal: Negative for back pain, joint pain and neck pain.  Skin:       No acne, rash, or lesions  Neurological: Negative for dizziness, tremors, focal weakness and weakness.  Endo/Heme/Allergies: Negative for polydipsia. Does not bruise/bleed easily.  Psychiatric/Behavioral: Negative for depression. The patient is not nervous/anxious and does not have insomnia.      VITAL SIGNS:   Vitals:   08/05/18 2247 08/05/18 2250 08/05/18 2251 08/05/18 2300  BP: (!) 86/64   94/64  Pulse: 90 88 88 88  Resp: (!) 26 (!) 21 (!) 29 (!) 32  Temp:      TempSrc:      SpO2: 93% 95% 98% 95%  Weight:      Height:       Wt Readings from Last 3 Encounters:  08/05/18 122.5 kg  04/11/18 127 kg   02/14/18 126.8 kg    PHYSICAL EXAMINATION:  Physical Exam  Vitals reviewed. Constitutional: He is oriented to person, place, and time. He appears well-developed and well-nourished. No distress.  HENT:  Head: Normocephalic and atraumatic.  Mouth/Throat: Oropharynx is clear and moist.  Eyes: Pupils are equal, round, and reactive to light. Conjunctivae and EOM are normal. No scleral icterus.  Neck: Normal range of motion. Neck supple. No JVD present. No thyromegaly present.  Cardiovascular: Normal rate, regular rhythm and intact distal pulses. Exam reveals no gallop and no friction rub.  No murmur heard. Respiratory: Effort normal. No respiratory distress. He has no wheezes. He has no rales.  Scattered rhonchi  GI: Soft. Bowel sounds are normal. He exhibits no distension. There is no abdominal tenderness.  Musculoskeletal: Normal range of motion.        General: No edema.     Comments: No arthritis, no gout  Lymphadenopathy:    He has no cervical adenopathy.  Neurological: He is alert and oriented to person, place, and time. No cranial nerve deficit.  No dysarthria, no aphasia  Skin: Skin is warm and dry. No rash noted. No erythema.  Psychiatric: He has a normal mood and affect. His behavior is normal. Judgment and thought content normal.    LABORATORY PANEL:   CBC Recent Labs  Lab 08/05/18 2056  WBC 1.0*  HGB 12.6*  HCT 37.6*  PLT 92*   ------------------------------------------------------------------------------------------------------------------  Chemistries  Recent Labs  Lab 08/05/18 2056  NA 135  K 4.0  CL 103  CO2 20*  GLUCOSE 140*  BUN 36*  CREATININE 3.99*  CALCIUM 8.4*  AST 20  ALT 19  ALKPHOS 28*  BILITOT 1.6*   ------------------------------------------------------------------------------------------------------------------  Cardiac Enzymes Recent Labs  Lab 08/05/18 2056  TROPONINI <0.03    ------------------------------------------------------------------------------------------------------------------  RADIOLOGY:  Dg Chest 2 View  Result Date: 08/05/2018 CLINICAL DATA:  Dyspnea EXAM: CHEST - 2 VIEW COMPARISON:  None. FINDINGS: Normal heart size. Normal mediastinal contour. No pneumothorax. No pleural effusion. Patchy right middle and lower lobe consolidation. No pulmonary edema. IMPRESSION: Patchy right middle and right lower lobe consolidation, probably multilobar pneumonia. Recommend follow-up PA and lateral post treatment chest radiographs in 4-6 weeks. Electronically Signed   By: Ilona Sorrel M.D.   On: 08/05/2018 21:31    EKG:   Orders placed or performed during the hospital encounter of 08/05/18  . EKG 12-Lead  . EKG 12-Lead  . ED EKG  . ED EKG    IMPRESSION AND PLAN:  Principal Problem:   Severe sepsis ([place]) -IV antibiotics initiated, cultures sent from the ED, blood pressure soft but stabilized with IV fluids, lactic acid elevated, we will continue IV fluids and trend lactic acid until within normal limits.  Source of infection is pneumonia. Active Problems:   Multiple myeloma without remission ([place]) -patient is on Revlimid, we will get an oncology consult   Febrile neutropenia ([place]) -due to pneumonia as below, patient is here with severe sepsis as above.  Treatment as above and below   Multifocal pneumonia -IV antibiotics, supportive treatment with duo nebs and antitussive   AKI (acute kidney injury) ([place]) -IV fluids as above, avoid nephrotoxins and monitor   BPH with obstruction/lower urinary tract symptoms -continue home meds  Chart review performed and case discussed with ED provider. Labs, imaging and/or ECG reviewed by provider and discussed with patient/family. Management plans discussed with the patient and/or family.  DVT PROPHYLAXIS: Mechanical only  GI PROPHYLAXIS:  None  ADMISSION STATUS: Inpatient     CODE STATUS: Full  TOTAL CRITICAL  CARE TIME TAKING CARE OF THIS PATIENT: 50 minutes.   Kyle Parker 08/05/2018, 11:29 PM  [facility]  Office  [phone]  CC: Primary care physician; Olin Hauser, DO  Note:  This document was prepared using Dragon voice recognition software and may include unintentional dictation errors.

## 2018-08-05 NOTE — ED Triage Notes (Signed)
Patient c/o fever, chills, vomiting, shortness of breath for two days. Patient has a recent history of pneumonia, two months ago. Patient states fever and vomiting have resolved, but c/o stomach cramps today. Patient placed on 2L O2 via [place] for sats at 90%. Patient is pale.

## 2018-08-05 NOTE — ED Provider Notes (Addendum)
[facility] Emergency Department Provider Note  ____________________________________________   I have reviewed the triage vital signs and the nursing notes. Where available I have reviewed prior notes and, if possible and indicated, outside hospital notes.    HISTORY  Chief Complaint Shortness of Breath    HPI Kyle Parker is a 62 y.o. male  With a history of multiple myeloma, with bone marrow transplant several years ago, history of thrombocytopenia in the past, neutropenia in the past, on maintenance Revlimid and acyclovir, what is documented in his last note from [facility] oncology is both multiple myeloma in remission and multiple myeloma without remission according to family however it is in remission, presents today with cough and fever.  Patient has had a low-grade cough and some sinus drainage for couple weeks but the fever and productive cough and shortness of breath started yesterday.  No chest pain.  He feels depleted generally speaking.  His T-max was 103.  His vaccinations including pneumonia and flu are up-to-date he states. He denies chest pain, or leg swelling.  No history of CHF.   Past Medical History:  Diagnosis Date  . Arthritis   . Compression fracture of cervical spine at C2-C3 level    Rods in neck. limited L/R movement  . Lytic bone lesions on xray   . Multiple myeloma without remission [facility])     Patient Active Problem List   Diagnosis Date Noted  . Colon cancer screening 09/08/2017  . BPH with obstruction/lower urinary tract symptoms 09/07/2017  . Multiple myeloma in remission ([place]) 03/21/2016  . Cervical mass 03/14/2016  . Muscle spasms of neck 03/07/2016  . Tinnitus of both ears 02/07/2016  . Onychomycosis 01/23/2016    Past Surgical History:  Procedure Laterality Date  . biopsy of the vertebral body     . COLONOSCOPY WITH PROPOFOL N/A 10/31/2017   Procedure: COLONOSCOPY WITH PROPOFOL;  Surgeon: Lin Landsman, MD;   Location: [place];  Service: Endoscopy;  Laterality: N/A;  . CT GUIDED BONE BIOPSY  03/23/2016  . NECK SURGERY    . POLYPECTOMY  10/31/2017   Procedure: POLYPECTOMY;  Surgeon: Lin Landsman, MD;  Location: [place];  Service: Endoscopy;;    Prior to Admission medications   Medication Sig Start Date End Date Taking? Authorizing Provider  acyclovir (ZOVIRAX) 400 MG tablet Take 1 tablet (400 mg total) by mouth 2 (two) times daily. 03/21/16   Cammie Sickle, MD  amoxicillin-clavulanate (AUGMENTIN) 875-125 MG tablet Take 1 tablet by mouth 2 (two) times daily. For 10 days 04/15/18   Olin Hauser, DO  aspirin EC 81 MG tablet Take by mouth.    [provider]  baclofen (LIORESAL) 10 MG tablet Take 0.5-1 tablets (5-10 mg total) by mouth 3 (three) times daily as needed for muscle spasms. 04/25/18   Karamalegos, Devonne Doughty, DO  gabapentin (NEURONTIN) 300 MG capsule Take by mouth. 09/15/16 10/29/17  [provider]  ipratropium (ATROVENT) 0.06 % nasal spray Place 2 sprays into both nostrils 4 (four) times daily. As needed. For up to 5-7 days then stop. 04/11/18   Karamalegos, Devonne Doughty, DO  lenalidomide (REVLIMID) 25 MG capsule Take one capsule daily on days 1-14 every 21 days. 03/21/16   Cammie Sickle, MD  naproxen (NAPROSYN) 500 MG tablet TAKE 1 TABLET (500 MG TOTAL) BY MOUTH 2 (TWO) TIMES DAILY WITH A MEAL. FOR 1-2 WEEKS THEN AS NEEDED 03/13/18   Olin Hauser, DO  ondansetron (ZOFRAN ODT) 4 MG disintegrating tablet Take 1 tablet (4 mg total) by mouth every 8 (eight) hours as needed for nausea or vomiting. 04/15/18   Parks Ranger, Devonne Doughty, DO  RAPAFLO 4 MG CAPS capsule Take 1 capsule (4 mg total) by mouth daily with breakfast. 04/11/18   Olin Hauser, DO  REVLIMID 10 MG capsule  10/25/17   [provider]  Vitamin D, Ergocalciferol, (DRISDOL) 50000 units CAPS capsule Take by mouth.    [provider]     Allergies Patient has no known allergies.  Family History  Problem Relation Age of Onset  . Heart attack Father   . Renal cancer Father   . CAD Mother   . Diabetes Mellitus II Mother   . Heart disease Mother   . Diabetes Mother   . Skin cancer Mother        non melanoma  . Prostate cancer Neg Hx   . Colon cancer Neg Hx     Social History Social History   Tobacco Use  . Smoking status: Never Smoker  . Smokeless tobacco: Current User    Types: Chew  Substance Use Topics  . Alcohol use: No    Alcohol/week: 0.0 standard drinks  . Drug use: No    Review of Systems Constitutional: No fever/chills Eyes: No visual changes. ENT: No sore throat. No stiff neck no neck pain Cardiovascular: Denies chest pain. Respiratory: + shortness of breath. Gastrointestinal:   no vomiting.  No diarrhea.  No constipation. Genitourinary: Negative for dysuria. Musculoskeletal: Negative lower extremity swelling Skin: Negative for rash. Neurological: Negative for severe headaches, focal weakness or numbness.   ____________________________________________   PHYSICAL EXAM:  VITAL SIGNS: ED Triage Vitals [08/05/18 2041]  Enc Vitals Group     BP (!) 158/126     Pulse Rate 89     Resp 18     Temp (!) 97.3 F (36.3 C)     Temp Source Oral     SpO2 92 %     Weight 270 lb (122.5 kg)     Height 6' (1.829 m)     Head Circumference      Peak Flow      Pain Score 0     Pain Loc      Pain Edu?      Excl. in [place]?     Constitutional: Alert and oriented.  Looks as if he does not feel well, he is able however to talk and laugh with me, he does not appear to be on the cusp of intubation or significant decompensation to the extent that we can determine Eyes: Conjunctivae are normal Head: Atraumatic HEENT: No congestion/rhinnorhea. Mucous membranes are moist.  Oropharynx non-erythematous Neck:   Nontender with no meningismus, no masses, no stridor Cardiovascular: Normal rate, regular  rhythm. Grossly normal heart sounds.  Good peripheral circulation. Respiratory: Mildly increased work of breathing, diffuse rhonchi noted on the right side Abdominal: Soft and nontender. No distention. No guarding no rebound Back:  There is no focal tenderness or step off.  there is no midline tenderness there are no lesions noted. there is no CVA tenderness Musculoskeletal: No lower extremity tenderness, no upper extremity tenderness. No joint effusions, no DVT signs strong distal pulses no edema Neurologic:  Normal speech and language. No gross focal neurologic deficits are appreciated.  Skin:  Skin is warm, dry and intact. No rash noted. Psychiatric: Mood and affect are normal. Speech and behavior are normal.  ____________________________________________  LABS (all labs ordered are listed, but only abnormal results are displayed)  Labs Reviewed  COMPREHENSIVE METABOLIC PANEL - Abnormal; Notable for the following components:      Result Value   CO2 20 (*)    Glucose, Bld 140 (*)    BUN 36 (*)    Creatinine, Ser 3.99 (*)    Calcium 8.4 (*)    Alkaline Phosphatase 28 (*)    Total Bilirubin 1.6 (*)    GFR calc non Af Amer 15 (*)    GFR calc Af Amer 18 (*)    All other components within normal limits  CBC WITH DIFFERENTIAL/PLATELET - Abnormal; Notable for the following components:   WBC 1.0 (*)    RBC 3.92 (*)    Hemoglobin 12.6 (*)    HCT 37.6 (*)    Platelets 92 (*)    All other components within normal limits  BRAIN NATRIURETIC PEPTIDE - Abnormal; Notable for the following components:   B Natriuretic Peptide 478.0 (*)    All other components within normal limits  RESPIRATORY PANEL BY PCR  CULTURE, BLOOD (ROUTINE X 2)  CULTURE, BLOOD (ROUTINE X 2)  INFLUENZA PANEL BY PCR (TYPE A & B)  LACTIC ACID, PLASMA  LACTIC ACID, PLASMA    Pertinent labs  results that were available during my care of the patient were reviewed by me and considered in my medical decision making (see  chart for details). ____________________________________________  EKG  I personally interpreted any EKGs ordered by me or triage Normal sinus rhythm rate 89 bpm no acute ST elevation or depression, normal axis unremarkable EKG rate 89 ____________________________________________  RADIOLOGY  Pertinent labs & imaging results that were available during my care of the patient were reviewed by me and considered in my medical decision making (see chart for details). If possible, patient and/or family made aware of any abnormal findings.  Dg Chest 2 View  Result Date: 08/05/2018 CLINICAL DATA:  Dyspnea EXAM: CHEST - 2 VIEW COMPARISON:  None. FINDINGS: Normal heart size. Normal mediastinal contour. No pneumothorax. No pleural effusion. Patchy right middle and lower lobe consolidation. No pulmonary edema. IMPRESSION: Patchy right middle and right lower lobe consolidation, probably multilobar pneumonia. Recommend follow-up PA and lateral post treatment chest radiographs in 4-6 weeks. Electronically Signed   By: Ilona Sorrel M.D.   On: 08/05/2018 21:31   ____________________________________________    PROCEDURES  Procedure(s) performed: None  Procedures  Critical Care performed: CRITICAL CARE Performed by: Schuyler Amor   Total critical care time: 69 minutes  Critical care time was exclusive of separately billable procedures and treating other patients.  Critical care was necessary to treat or prevent imminent or life-threatening deterioration.  Critical care was time spent personally by me on the following activities: development of treatment plan with patient and/or surrogate as well as nursing, discussions with consultants, evaluation of patient's response to treatment, examination of patient, obtaining history from patient or surrogate, ordering and performing treatments and interventions, ordering and review of laboratory studies, ordering and review of radiographic studies, pulse  oximetry and re-evaluation of patient's condition.   ____________________________________________   INITIAL IMPRESSION / ASSESSMENT AND PLAN / ED COURSE  Pertinent labs & imaging results that were available during my care of the patient were reviewed by me and considered in my medical decision making (see chart for details).  Patient here with cough, borderline oxygen saturation which is come up with nasal cannula, and multilobar pneumonia in the context of acute  neutropenia, baseline white counts usually 3 today it is 1, creatinine is elevated, this represents acute kidney injury, we are giving him IV fluids, I am sending influenza testing on him, as that certainly could be a cause of his symptoms.  In the meantime however I am giving him broad-spectrum antibiotics with cefepime, azithromycin and vancomycin, blood cultures are being obtained at this time, and a lactic acid has been sent.  We are starting him on hydration.  BNP is somewhat elevated I will add a troponin to see if he has evidence of myocarditis.  No known history of CHF that he reveals to me, last echo was in January 2018 which showed an EF of 55% according to [facility] notes.  I have placed the patient in neutropenic precautions in the emergency department.  We are admitting him to the hospitalist service I have already talked to Dr. Jannifer Franklin.  Appreciate consult.  ----------------------------------------- 10:58 PM on 08/05/2018 ----------------------------------------- Blood pressure was quite low when we rechecked it, baseline blood pressure he states is 100/70-80, so is not too far off his baseline at this time although he was before.  We have given him therefore aggressive IV fluids, using ideal weight I have given him liters of fluid.  Pressures are nearly 190 at this time he is mentating clearly sats are still maintaining, he is septic clearly he has received broad-spectrum antibiotics and will be admitted to the  ICU  ----------------------------------------- 11:13 PM on 08/05/2018 -----------------------------------------  Pressure in the 90s.seen by hospitalist and on their service at this time.      ____________________________________________   FINAL CLINICAL IMPRESSION(S) / ED DIAGNOSES  Final diagnoses:  None      This chart was dictated using voice recognition software.  Despite best efforts to proofread,  errors can occur which can change meaning.      Schuyler Amor, MD 08/05/18 2210    Schuyler Amor, MD 08/05/18 [id number]    Schuyler Amor, MD 08/05/18 [phone]

## 2018-08-05 NOTE — ED Notes (Signed)
Report given to Cole RN 

## 2018-08-05 NOTE — ED Notes (Signed)
Date and time results received: 08/05/18 2246 (use smartphrase ".now" to insert current time)  Test: lactic Critical Value: 3.8  Name of Provider Notified: McShane  Orders Received? Or Actions Taken?: Orders Received - See Orders for details

## 2018-08-06 ENCOUNTER — Inpatient Hospital Stay: Payer: BLUE CROSS/BLUE SHIELD

## 2018-08-06 ENCOUNTER — Ambulatory Visit: Payer: BLUE CROSS/BLUE SHIELD | Admitting: Nurse Practitioner

## 2018-08-06 ENCOUNTER — Inpatient Hospital Stay: Payer: Self-pay

## 2018-08-06 DIAGNOSIS — Z87311 Personal history of (healed) other pathological fracture: Secondary | ICD-10-CM

## 2018-08-06 DIAGNOSIS — M899 Disorder of bone, unspecified: Secondary | ICD-10-CM

## 2018-08-06 DIAGNOSIS — Z981 Arthrodesis status: Secondary | ICD-10-CM

## 2018-08-06 DIAGNOSIS — D709 Neutropenia, unspecified: Secondary | ICD-10-CM

## 2018-08-06 DIAGNOSIS — D696 Thrombocytopenia, unspecified: Secondary | ICD-10-CM

## 2018-08-06 DIAGNOSIS — N400 Enlarged prostate without lower urinary tract symptoms: Secondary | ICD-10-CM

## 2018-08-06 DIAGNOSIS — A419 Sepsis, unspecified organism: Secondary | ICD-10-CM

## 2018-08-06 DIAGNOSIS — N179 Acute kidney failure, unspecified: Secondary | ICD-10-CM | POA: Diagnosis present

## 2018-08-06 DIAGNOSIS — Z8701 Personal history of pneumonia (recurrent): Secondary | ICD-10-CM

## 2018-08-06 DIAGNOSIS — Z79899 Other long term (current) drug therapy: Secondary | ICD-10-CM

## 2018-08-06 DIAGNOSIS — R6520 Severe sepsis without septic shock: Secondary | ICD-10-CM

## 2018-08-06 DIAGNOSIS — G620 Drug-induced polyneuropathy: Secondary | ICD-10-CM

## 2018-08-06 DIAGNOSIS — J9601 Acute respiratory failure with hypoxia: Secondary | ICD-10-CM

## 2018-08-06 DIAGNOSIS — C9001 Multiple myeloma in remission: Secondary | ICD-10-CM

## 2018-08-06 DIAGNOSIS — Z79891 Long term (current) use of opiate analgesic: Secondary | ICD-10-CM

## 2018-08-06 DIAGNOSIS — Z7982 Long term (current) use of aspirin: Secondary | ICD-10-CM

## 2018-08-06 DIAGNOSIS — Z9911 Dependence on respirator [ventilator] status: Secondary | ICD-10-CM

## 2018-08-06 DIAGNOSIS — J189 Pneumonia, unspecified organism: Secondary | ICD-10-CM

## 2018-08-06 DIAGNOSIS — J181 Lobar pneumonia, unspecified organism: Secondary | ICD-10-CM

## 2018-08-06 LAB — CBC
HCT: 34.7 % — ABNORMAL LOW (ref 39.0–52.0)
Hemoglobin: 11.3 g/dL — ABNORMAL LOW (ref 13.0–17.0)
MCH: 31.8 pg (ref 26.0–34.0)
MCHC: 32.6 g/dL (ref 30.0–36.0)
MCV: 97.7 fL (ref 80.0–100.0)
NRBC: 0 % (ref 0.0–0.2)
Platelets: 67 10*3/uL — ABNORMAL LOW (ref 150–400)
RBC: 3.55 MIL/uL — AB (ref 4.22–5.81)
RDW: 14.6 % (ref 11.5–15.5)
WBC: 0.4 10*3/uL — CL (ref 4.0–10.5)

## 2018-08-06 LAB — RESPIRATORY PANEL BY PCR
Adenovirus: NOT DETECTED
BORDETELLA PERTUSSIS-RVPCR: NOT DETECTED
CHLAMYDOPHILA PNEUMONIAE-RVPPCR: NOT DETECTED
CORONAVIRUS HKU1-RVPPCR: DETECTED — AB
Coronavirus 229E: NOT DETECTED
Coronavirus NL63: NOT DETECTED
Coronavirus OC43: NOT DETECTED
Influenza A: NOT DETECTED
Influenza B: NOT DETECTED
METAPNEUMOVIRUS-RVPPCR: NOT DETECTED
Mycoplasma pneumoniae: NOT DETECTED
PARAINFLUENZA VIRUS 2-RVPPCR: NOT DETECTED
Parainfluenza Virus 1: NOT DETECTED
Parainfluenza Virus 3: NOT DETECTED
Parainfluenza Virus 4: NOT DETECTED
Respiratory Syncytial Virus: NOT DETECTED
Rhinovirus / Enterovirus: NOT DETECTED

## 2018-08-06 LAB — BLOOD GAS, ARTERIAL
ACID-BASE DEFICIT: 6.5 mmol/L — AB (ref 0.0–2.0)
ALLENS TEST (PASS/FAIL): POSITIVE — AB
Acid-base deficit: 8.3 mmol/L — ABNORMAL HIGH (ref 0.0–2.0)
Bicarbonate: 16.5 mmol/L — ABNORMAL LOW (ref 20.0–28.0)
Bicarbonate: 18.9 mmol/L — ABNORMAL LOW (ref 20.0–28.0)
Delivery systems: POSITIVE
Expiratory PAP: 5
FIO2: 0.3
FIO2: 0.6
Inspiratory PAP: 10
O2 Saturation: 92.6 %
O2 Saturation: 92.3 %
PEEP: 5 cmH2O
Patient temperature: 37
Patient temperature: 37
RATE: 16 resp/min
VT: 500 mL
pCO2 arterial: 26 mmHg — ABNORMAL LOW (ref 32.0–48.0)
pCO2 arterial: 44 mmHg (ref 32.0–48.0)
pH, Arterial: 7.41 (ref 7.350–7.450)
pH, Arterial: 7.24 — ABNORMAL LOW (ref 7.350–7.450)
pO2, Arterial: 65 mmHg — ABNORMAL LOW (ref 83.0–108.0)
pO2, Arterial: 76 mmHg — ABNORMAL LOW (ref 83.0–108.0)

## 2018-08-06 LAB — LACTIC ACID, PLASMA
LACTIC ACID, VENOUS: 3.8 mmol/L — AB (ref 0.5–1.9)
Lactic Acid, Venous: 3.2 mmol/L (ref 0.5–1.9)
Lactic Acid, Venous: 3.1 mmol/L (ref 0.5–1.9)

## 2018-08-06 LAB — PROCALCITONIN: Procalcitonin: 110.44 ng/mL

## 2018-08-06 LAB — BASIC METABOLIC PANEL
ANION GAP: 13 (ref 5–15)
BUN: 37 mg/dL — ABNORMAL HIGH (ref 8–23)
CO2: 17 mmol/L — ABNORMAL LOW (ref 22–32)
Calcium: 7.1 mg/dL — ABNORMAL LOW (ref 8.9–10.3)
Chloride: 108 mmol/L (ref 98–111)
Creatinine, Ser: 3.4 mg/dL — ABNORMAL HIGH (ref 0.61–1.24)
GFR calc Af Amer: 21 mL/min — ABNORMAL LOW (ref >60)
GFR calc non Af Amer: 18 mL/min — ABNORMAL LOW (ref >60)
Glucose, Bld: 129 mg/dL — ABNORMAL HIGH (ref 70–99)
Potassium: 4.2 mmol/L (ref 3.5–5.1)
Sodium: 138 mmol/L (ref 135–145)

## 2018-08-06 LAB — GLUCOSE, CAPILLARY
Glucose-Capillary: 118 mg/dL — ABNORMAL HIGH (ref 70–99)
Glucose-Capillary: 86 mg/dL (ref 70–99)
Glucose-Capillary: 91 mg/dL (ref 70–99)
Glucose-Capillary: 98 mg/dL (ref 70–99)

## 2018-08-06 LAB — MRSA PCR SCREENING: MRSA by PCR: NEGATIVE

## 2018-08-06 LAB — PROTIME-INR
INR: 1.53
Prothrombin Time: 18.2 seconds — ABNORMAL HIGH (ref 11.4–15.2)

## 2018-08-06 LAB — TRIGLYCERIDES: Triglycerides: 76 mg/dL (ref <150)

## 2018-08-06 LAB — PATHOLOGIST SMEAR REVIEW

## 2018-08-06 LAB — STREP PNEUMONIAE URINARY ANTIGEN: Strep Pneumo Urinary Antigen: POSITIVE — AB

## 2018-08-06 MED ORDER — ASPIRIN EC 81 MG PO TBEC
162.0000 mg | DELAYED_RELEASE_TABLET | Freq: Every day | ORAL | Status: DC
  Administered 2018-08-06: 162 mg via ORAL
  Filled 2018-08-06: qty 2

## 2018-08-06 MED ORDER — VANCOMYCIN HCL 10 G IV SOLR
1250.0000 mg | INTRAVENOUS | Status: DC
  Administered 2018-08-06: 1250 mg via INTRAVENOUS
  Filled 2018-08-06: qty 1250

## 2018-08-06 MED ORDER — PANTOPRAZOLE SODIUM 20 MG PO TBEC
20.0000 mg | DELAYED_RELEASE_TABLET | Freq: Every day | ORAL | Status: DC
  Filled 2018-08-06: qty 1

## 2018-08-06 MED ORDER — GABAPENTIN 300 MG PO CAPS
600.0000 mg | ORAL_CAPSULE | Freq: Three times a day (TID) | ORAL | Status: DC
  Administered 2018-08-06: 600 mg via ORAL
  Filled 2018-08-06: qty 2

## 2018-08-06 MED ORDER — ETOMIDATE 2 MG/ML IV SOLN
INTRAVENOUS | Status: AC
  Administered 2018-08-06: 40 mg
  Filled 2018-08-06: qty 10

## 2018-08-06 MED ORDER — ONDANSETRON HCL 4 MG/2ML IJ SOLN: 4.0000 mg | Freq: Four times a day (QID) | INTRAMUSCULAR | Status: DC | PRN

## 2018-08-06 MED ORDER — SODIUM CHLORIDE 0.9 % IV BOLUS
500.0000 mL | Freq: Once | INTRAVENOUS | Status: AC
  Administered 2018-08-06: 500 mL via INTRAVENOUS

## 2018-08-06 MED ORDER — HYDROCOD POLST-CPM POLST ER 10-8 MG/5ML PO SUER
5.0000 mL | Freq: Two times a day (BID) | ORAL | Status: DC | PRN
  Administered 2018-08-06: 5 mL via ORAL
  Filled 2018-08-06: qty 5

## 2018-08-06 MED ORDER — ACYCLOVIR 200 MG/5ML PO SUSP
400.0000 mg | Freq: Two times a day (BID) | ORAL | Status: DC
  Administered 2018-08-06 – 2018-08-10 (×8): 400 mg
  Filled 2018-08-06 (×11): qty 10

## 2018-08-06 MED ORDER — STERILE WATER FOR INJECTION IJ SOLN
INTRAMUSCULAR | Status: AC
  Administered 2018-08-06: 10 mL
  Filled 2018-08-06: qty 10

## 2018-08-06 MED ORDER — VANCOMYCIN HCL 10 G IV SOLR
1250.0000 mg | INTRAVENOUS | Status: DC
  Administered 2018-08-07 – 2018-08-09 (×3): 1250 mg via INTRAVENOUS
  Filled 2018-08-06 (×3): qty 1250

## 2018-08-06 MED ORDER — VECURONIUM BROMIDE 10 MG IV SOLR
INTRAVENOUS | Status: AC
  Filled 2018-08-06: qty 10

## 2018-08-06 MED ORDER — FENTANYL CITRATE (PF) 100 MCG/2ML IJ SOLN
INTRAMUSCULAR | Status: AC
  Administered 2018-08-06: 100 ug via INTRAVENOUS
  Filled 2018-08-06: qty 2

## 2018-08-06 MED ORDER — DEXMEDETOMIDINE HCL IN NACL 400 MCG/100ML IV SOLN
0.4000 ug/kg/h | INTRAVENOUS | Status: DC
  Administered 2018-08-06: 1.2 ug/kg/h via INTRAVENOUS
  Administered 2018-08-06: 0.5 ug/kg/h via INTRAVENOUS
  Filled 2018-08-06 (×2): qty 100

## 2018-08-06 MED ORDER — PROPOFOL 1000 MG/100ML IV EMUL
5.0000 ug/kg/min | INTRAVENOUS | Status: DC
  Administered 2018-08-06: 40 ug/kg/min via INTRAVENOUS
  Administered 2018-08-06 (×4): 30 ug/kg/min via INTRAVENOUS
  Administered 2018-08-07 (×3): 40 ug/kg/min via INTRAVENOUS
  Administered 2018-08-07: 50 ug/kg/min via INTRAVENOUS
  Administered 2018-08-07 (×3): 40 ug/kg/min via INTRAVENOUS
  Administered 2018-08-08 (×2): 60 ug/kg/min via INTRAVENOUS
  Administered 2018-08-08: 80 ug/kg/min via INTRAVENOUS
  Administered 2018-08-08 (×2): 50 ug/kg/min via INTRAVENOUS
  Administered 2018-08-08 (×2): 30 ug/kg/min via INTRAVENOUS
  Administered 2018-08-09: 40 ug/kg/min via INTRAVENOUS
  Administered 2018-08-09: 30 ug/kg/min via INTRAVENOUS
  Filled 2018-08-06 (×21): qty 100

## 2018-08-06 MED ORDER — INSULIN ASPART 100 UNIT/ML ~~LOC~~ SOLN: 0.0000 [IU] | SUBCUTANEOUS | Status: DC

## 2018-08-06 MED ORDER — SENNOSIDES-DOCUSATE SODIUM 8.6-50 MG PO TABS
1.0000 | ORAL_TABLET | Freq: Two times a day (BID) | ORAL | Status: DC
  Administered 2018-08-06 – 2018-08-08 (×4): 1
  Filled 2018-08-06 (×4): qty 1

## 2018-08-06 MED ORDER — GABAPENTIN 250 MG/5ML PO SOLN
600.0000 mg | Freq: Two times a day (BID) | ORAL | Status: DC
  Administered 2018-08-06 – 2018-08-10 (×8): 600 mg
  Filled 2018-08-06 (×15): qty 12

## 2018-08-06 MED ORDER — PANTOPRAZOLE SODIUM 40 MG PO PACK
40.0000 mg | PACK | Freq: Every day | ORAL | Status: DC
  Administered 2018-08-07 – 2018-08-10 (×4): 40 mg
  Filled 2018-08-06 (×4): qty 20

## 2018-08-06 MED ORDER — TAMSULOSIN HCL 0.4 MG PO CAPS
0.4000 mg | ORAL_CAPSULE | Freq: Every day | ORAL | Status: DC
  Administered 2018-08-06: 0.4 mg via ORAL
  Filled 2018-08-06: qty 1

## 2018-08-06 MED ORDER — SODIUM CHLORIDE 0.9% FLUSH
10.0000 mL | Freq: Two times a day (BID) | INTRAVENOUS | Status: DC
  Administered 2018-08-06 – 2018-08-11 (×9): 10 mL

## 2018-08-06 MED ORDER — SODIUM CHLORIDE 0.9% FLUSH: 10.0000 mL | INTRAVENOUS | Status: DC | PRN

## 2018-08-06 MED ORDER — ACETAMINOPHEN 650 MG RE SUPP: 650.0000 mg | Freq: Four times a day (QID) | RECTAL | Status: DC | PRN

## 2018-08-06 MED ORDER — CHLORHEXIDINE GLUCONATE 0.12% ORAL RINSE (MEDLINE KIT)
15.0000 mL | Freq: Two times a day (BID) | OROMUCOSAL | Status: DC
  Administered 2018-08-06 – 2018-08-11 (×10): 15 mL via OROMUCOSAL

## 2018-08-06 MED ORDER — MORPHINE SULFATE (PF) 2 MG/ML IV SOLN: 2.0000 mg | Freq: Once | INTRAVENOUS | Status: AC

## 2018-08-06 MED ORDER — ACYCLOVIR 400 MG PO TABS
400.0000 mg | ORAL_TABLET | Freq: Two times a day (BID) | ORAL | Status: DC
  Filled 2018-08-06 (×4): qty 1

## 2018-08-06 MED ORDER — ACETAMINOPHEN 325 MG PO TABS: 650.0000 mg | ORAL_TABLET | Freq: Four times a day (QID) | ORAL | Status: DC | PRN

## 2018-08-06 MED ORDER — CALCIUM GLUCONATE-NACL 1-0.675 GM/50ML-% IV SOLN
1.0000 g | Freq: Once | INTRAVENOUS | Status: AC
  Administered 2018-08-06: 1000 mg via INTRAVENOUS
  Filled 2018-08-06: qty 50

## 2018-08-06 MED ORDER — STERILE WATER FOR INJECTION IV SOLN
INTRAVENOUS | Status: DC
  Administered 2018-08-06 – 2018-08-07 (×2): via INTRAVENOUS
  Filled 2018-08-06 (×5): qty 850

## 2018-08-06 MED ORDER — VECURONIUM BROMIDE 10 MG IV SOLR: 10.0000 mg | Freq: Once | INTRAVENOUS | Status: DC

## 2018-08-06 MED ORDER — FENTANYL CITRATE (PF) 100 MCG/2ML IJ SOLN
100.0000 ug | INTRAMUSCULAR | Status: DC | PRN
  Administered 2018-08-06 – 2018-08-08 (×8): 100 ug via INTRAVENOUS
  Administered 2018-08-09: 50 ug via INTRAVENOUS
  Administered 2018-08-09 (×2): 100 ug via INTRAVENOUS
  Filled 2018-08-06 (×8): qty 2

## 2018-08-06 MED ORDER — MORPHINE SULFATE (PF) 2 MG/ML IV SOLN
1.0000 mg | INTRAVENOUS | Status: DC | PRN
  Administered 2018-08-06 (×2): 2 mg via INTRAVENOUS
  Administered 2018-08-06: 1 mg via INTRAVENOUS
  Filled 2018-08-06 (×4): qty 1

## 2018-08-06 MED ORDER — GUAIFENESIN-DM 100-10 MG/5ML PO SYRP
5.0000 mL | ORAL_SOLUTION | ORAL | Status: DC | PRN
  Filled 2018-08-06: qty 5

## 2018-08-06 MED ORDER — SODIUM CHLORIDE 0.9 % IV SOLN
500.0000 mg | INTRAVENOUS | Status: DC
  Filled 2018-08-06: qty 500

## 2018-08-06 MED ORDER — MIDAZOLAM HCL 2 MG/2ML IJ SOLN
INTRAMUSCULAR | Status: AC
  Administered 2018-08-06: 4 mg
  Filled 2018-08-06: qty 4

## 2018-08-06 MED ORDER — ORAL CARE MOUTH RINSE
15.0000 mL | OROMUCOSAL | Status: DC
  Administered 2018-08-06 – 2018-08-09 (×28): 15 mL via OROMUCOSAL

## 2018-08-06 MED ORDER — IPRATROPIUM-ALBUTEROL 0.5-2.5 (3) MG/3ML IN SOLN
3.0000 mL | RESPIRATORY_TRACT | Status: DC | PRN
  Filled 2018-08-06: qty 3

## 2018-08-06 MED ORDER — BUDESONIDE 0.5 MG/2ML IN SUSP
0.5000 mg | Freq: Two times a day (BID) | RESPIRATORY_TRACT | Status: DC
  Administered 2018-08-06 – 2018-08-11 (×12): 0.5 mg via RESPIRATORY_TRACT
  Filled 2018-08-06 (×12): qty 2

## 2018-08-06 MED ORDER — BENZONATATE 100 MG PO CAPS: 200.0000 mg | ORAL_CAPSULE | Freq: Three times a day (TID) | ORAL | Status: DC | PRN

## 2018-08-06 MED ORDER — FENTANYL CITRATE (PF) 100 MCG/2ML IJ SOLN
100.0000 ug | INTRAMUSCULAR | Status: DC | PRN
  Filled 2018-08-06 (×5): qty 2

## 2018-08-06 MED ORDER — ALPRAZOLAM 0.25 MG PO TABS: 0.2500 mg | ORAL_TABLET | Freq: Once | ORAL | Status: DC

## 2018-08-06 MED ORDER — ASPIRIN 81 MG PO CHEW
162.0000 mg | CHEWABLE_TABLET | Freq: Every day | ORAL | Status: DC
  Administered 2018-08-07 – 2018-08-12 (×6): 162 mg
  Filled 2018-08-06 (×7): qty 2

## 2018-08-06 MED ORDER — TBO-FILGRASTIM 480 MCG/0.8ML ~~LOC~~ SOSY
480.0000 ug | PREFILLED_SYRINGE | Freq: Once | SUBCUTANEOUS | Status: AC
  Administered 2018-08-06: 480 ug via SUBCUTANEOUS
  Filled 2018-08-06: qty 0.8

## 2018-08-06 MED ORDER — MORPHINE SULFATE (PF) 2 MG/ML IV SOLN
2.0000 mg | Freq: Once | INTRAVENOUS | Status: AC
  Administered 2018-08-06: 2 mg via INTRAVENOUS

## 2018-08-06 MED ORDER — FENTANYL CITRATE (PF) 100 MCG/2ML IJ SOLN
100.0000 ug | Freq: Once | INTRAMUSCULAR | Status: AC
  Administered 2018-08-06: 100 ug via INTRAVENOUS

## 2018-08-06 MED ORDER — MIDAZOLAM HCL (PF) 5 MG/ML IJ SOLN: 4.0000 mg | Freq: Once | INTRAMUSCULAR | Status: DC

## 2018-08-06 MED ORDER — ONDANSETRON HCL 4 MG PO TABS: 4.0000 mg | ORAL_TABLET | Freq: Four times a day (QID) | ORAL | Status: DC | PRN

## 2018-08-06 MED ORDER — SODIUM CHLORIDE 0.9 % IV SOLN
500.0000 mg | INTRAVENOUS | Status: DC
  Administered 2018-08-06 – 2018-08-08 (×3): 500 mg via INTRAVENOUS
  Filled 2018-08-06 (×4): qty 500

## 2018-08-06 MED ORDER — IPRATROPIUM-ALBUTEROL 0.5-2.5 (3) MG/3ML IN SOLN
3.0000 mL | Freq: Four times a day (QID) | RESPIRATORY_TRACT | Status: DC
  Administered 2018-08-06 – 2018-08-11 (×24): 3 mL via RESPIRATORY_TRACT
  Filled 2018-08-06 (×23): qty 3

## 2018-08-06 MED ORDER — SODIUM CHLORIDE 0.9 % IV SOLN
2.0000 g | Freq: Two times a day (BID) | INTRAVENOUS | Status: DC
  Administered 2018-08-06 (×2): 2 g via INTRAVENOUS
  Filled 2018-08-06 (×4): qty 2

## 2018-08-06 MED ORDER — ACYCLOVIR 200 MG PO CAPS
400.0000 mg | ORAL_CAPSULE | Freq: Two times a day (BID) | ORAL | Status: DC
  Filled 2018-08-06 (×2): qty 2

## 2018-08-06 NOTE — Consult Note (Signed)
Hematology/Oncology Consult note [facility] Telephone:([phone] [phone]  Patient Care Team: Olin Hauser, DO as PCP - General (Family Medicine)   Name of the patient: Kyle Parker  [id number]  01-28-1956   Date of visit: 08/06/18 REASON FOR COSULTATION:   History of presenting illness-  62 y.o. male with PMH listed at below who presents to ER for evaluation of 2 days of fatigue and malaise, significant shortness of breath starting today.  Patient cannot provide any history.  Admitted to ICU due to septic shock on vasopressors. When I stop by patient is being intubated.   Limited history is obtained from reviewing previous medical records via care everywhere.. Patient has history of multiple myeloma, IgG kappa, status post autologous bone marrow transplantation in March 2018, follows up at [facility] oncology.  Patient is on post transplant vaccination schedule.   Patient has been on maintenance Revlimid 10 mg daily since then.   Post transplant bone marrow biopsy on 09/17/2017 showed MRD negative. Patient is on acyclovir for shingle prophylaxis Last seen by multiple myeloma team 06/11/2018.  At that time, CBC was done on 11/5 2019, showed WBC 3.1, hemoglobin 12, platelet count 124.  07/10/2018 had left forearm shave of skin biopsy showed squamous cell carcinoma with area concern for invasion.  In the emergency room, chest x-ray showed right middle lobe dense atelectasis/consolidation.  Right middle lobe pneumonia most likely present.  Small right pleural effusion.  Cardiomegaly with mild pulmonary venous congestion.   Review of Systems  Unable to perform ROS: Intubated    No Known Allergies  Patient Active Problem List   Diagnosis Date Noted  . AKI (acute kidney injury) ([place]) 08/06/2018  . Febrile neutropenia ([place]) 08/05/2018  . Multifocal pneumonia 08/05/2018  . Severe sepsis ([place]) 08/05/2018  . Colon cancer screening 09/08/2017  .  BPH with obstruction/lower urinary tract symptoms 09/07/2017  . Multiple myeloma without remission ([place]) 03/21/2016  . Cervical mass 03/14/2016  . Muscle spasms of neck 03/07/2016  . Tinnitus of both ears 02/07/2016  . Onychomycosis 01/23/2016     Past Medical History:  Diagnosis Date  . Arthritis   . Compression fracture of cervical spine at C2-C3 level    Rods in neck. limited L/R movement  . Lytic bone lesions on xray   . Multiple myeloma without remission [facility])      Past Surgical History:  Procedure Laterality Date  . biopsy of the vertebral body     . COLONOSCOPY WITH PROPOFOL N/A 10/31/2017   Procedure: COLONOSCOPY WITH PROPOFOL;  Surgeon: Lin Landsman, MD;  Location: [place];  Service: Endoscopy;  Laterality: N/A;  . CT GUIDED BONE BIOPSY  03/23/2016  . NECK SURGERY    . POLYPECTOMY  10/31/2017   Procedure: POLYPECTOMY;  Surgeon: Lin Landsman, MD;  Location: [place];  Service: Endoscopy;;    Social History   Socioeconomic History  . Marital status: Married    Spouse name: Not on file  . Number of children: Not on file  . Years of education: Not on file  . Highest education level: Not on file  Occupational History  . Not on file  Social Needs  . Financial resource strain: Not on file  . Food insecurity:    Worry: Not on file    Inability: Not on file  . Transportation needs:    Medical: Not on file    Non-medical: Not on file  Tobacco Use  . Smoking  status: Never Smoker  . Smokeless tobacco: Current User    Types: Chew  Substance and Sexual Activity  . Alcohol use: No    Alcohol/week: 0.0 standard drinks  . Drug use: No  . Sexual activity: Not Currently  Lifestyle  . Physical activity:    Days per week: Not on file    Minutes per session: Not on file  . Stress: Not on file  Relationships  . Social connections:    Talks on phone: Not on file    Gets together: Not on file    Attends religious service: Not on  file    Active member of club or organization: Not on file    Attends meetings of clubs or organizations: Not on file    Relationship status: Not on file  . Intimate partner violence:    Fear of current or ex partner: Not on file    Emotionally abused: Not on file    Physically abused: Not on file    Forced sexual activity: Not on file  Other Topics Concern  . Not on file  Social History Narrative  . Not on file     Family History  Problem Relation Age of Onset  . Heart attack Father   . Renal cancer Father   . CAD Mother   . Diabetes Mellitus II Mother   . Heart disease Mother   . Diabetes Mother   . Skin cancer Mother        non melanoma  . Prostate cancer Neg Hx   . Colon cancer Neg Hx      Current Facility-Administered Medications:  .  acetaminophen (TYLENOL) tablet 650 mg, 650 mg, Oral, Q6H PRN **OR** acetaminophen (TYLENOL) suppository 650 mg, 650 mg, Rectal, Q6H PRN, Lance Coon, MD .  acyclovir (ZOVIRAX) 200 MG/5ML suspension SUSP 400 mg, 400 mg, Per Tube, BID, Conforti, John, DO .  [START ON 08/07/2018] aspirin chewable tablet 162 mg, 162 mg, Per Tube, Daily, Conforti, John, DO .  azithromycin (ZITHROMAX) 500 mg in sodium chloride 0.9 % 250 mL IVPB, 500 mg, Intravenous, Q24H, Conforti, John, DO .  budesonide (PULMICORT) nebulizer solution 0.5 mg, 0.5 mg, Nebulization, BID, Blakeney, Dana G, NP, 0.5 mg at 08/06/18 0626 .  ceFEPIme (MAXIPIME) 2 g in sodium chloride 0.9 % 100 mL IVPB, 2 g, Intravenous, Q12H, Lance Coon, MD, Stopped at 08/06/18 [phone] .  chlorhexidine gluconate (MEDLINE KIT) (PERIDEX) 0.12 % solution 15 mL, 15 mL, Mouth Rinse, BID, Conforti, John, DO .  dexmedetomidine (PRECEDEX) 400 MCG/100ML (4 mcg/mL) infusion, 0.4-1.2 mcg/kg/hr, Intravenous, Titrated, Blakeney, Dana G, NP, Last Rate: 19.32 mL/hr at 08/06/18 1400, 0.6 mcg/kg/hr at 08/06/18 1400 .  fentaNYL (SUBLIMAZE) injection 100 mcg, 100 mcg, Intravenous, Q15 min PRN, Conforti, John, DO .  fentaNYL  (SUBLIMAZE) injection 100 mcg, 100 mcg, Intravenous, Q2H PRN, Conforti, John, DO .  gabapentin (NEURONTIN) 250 MG/5ML solution 600 mg, 600 mg, Per Tube, Q12H, Conforti, John, DO .  ipratropium-albuterol (DUONEB) 0.5-2.5 (3) MG/3ML nebulizer solution 3 mL, 3 mL, Nebulization, Q4H PRN, Lance Coon, MD .  ipratropium-albuterol (DUONEB) 0.5-2.5 (3) MG/3ML nebulizer solution 3 mL, 3 mL, Nebulization, Q6H, Blakeney, Dana G, NP, 3 mL at 08/06/18 1417 .  MEDLINE mouth rinse, 15 mL, Mouth Rinse, 10 times per day, Conforti, John, DO .  midazolam PF (VERSED) injection 4 mg, 4 mg, Intravenous, Once, Conforti, John, DO .  norepinephrine (LEVOPHED) 4 mg in dextrose 5 % 250 mL (0.016 mg/mL) infusion, 0-40 mcg/min, Intravenous, Continuous,  Lance Coon, MD, Last Rate: 30 mL/hr at 08/06/18 1400, 8 mcg/min at 08/06/18 1400 .  [START ON 08/07/2018] pantoprazole sodium (PROTONIX) 40 mg/20 mL oral suspension 40 mg, 40 mg, Per Tube, Daily, Conforti, John, DO .  propofol (DIPRIVAN) 1000 MG/100ML infusion, 5-80 mcg/kg/min, Intravenous, Titrated, Conforti, John, DO, Last Rate: 23.2 mL/hr at 08/06/18 1353, 30 mcg/kg/min at 08/06/18 1353 .  [START ON 08/07/2018] vancomycin (VANCOCIN) 1,250 mg in sodium chloride 0.9 % 250 mL IVPB, 1,250 mg, Intravenous, Q24H, Charlett Nose, RPH .  vecuronium (NORCURON) injection 10 mg, 10 mg, Intravenous, Once, Hermelinda Dellen, DO   Physical exam:  Vitals:   08/06/18 0900 08/06/18 1000 08/06/18 1100 08/06/18 1200  BP: (!) 167/151 (!) 128/32 107/72   Pulse: 73 75 78   Resp: (!) 32 (!) 28 (!) 36   Temp:    (!) 102.2 F (39 C)  TempSrc:    Oral  SpO2: 97% 97% 95%   Weight:      Height:       Physical Exam  Constitutional: He is oriented to person, place, and time.  Lethargic  HENT:  Head: Normocephalic and atraumatic.  Neck: Normal range of motion. Neck supple.  Cardiovascular: Normal rate and regular rhythm.  No murmur heard. Pulmonary/Chest: Effort normal.  Abdominal: Soft.   Neurological: He is alert and oriented to person, place, and time.  Skin: Skin is warm.  Limited physical examination due to acuity of patient's condition.  Being intubated      CMP Latest Ref Rng & Units 08/06/2018  Glucose 70 - 99 mg/dL 129(H)  BUN 8 - 23 mg/dL 37(H)  Creatinine 0.61 - 1.24 mg/dL 3.40(H)  Sodium 135 - 145 mmol/L 138  Potassium 3.5 - 5.1 mmol/L 4.2  Chloride 98 - 111 mmol/L 108  CO2 22 - 32 mmol/L 17(L)  Calcium 8.9 - 10.3 mg/dL 7.1(L)  Total Protein 6.5 - 8.1 g/dL -  Total Bilirubin 0.3 - 1.2 mg/dL -  Alkaline Phos 38 - 126 U/L -  AST 15 - 41 U/L -  ALT 0 - 44 U/L -   CBC Latest Ref Rng & Units 08/06/2018  WBC 4.0 - 10.5 K/uL 0.4(LL)  Hemoglobin 13.0 - 17.0 g/dL 11.3(L)  Hematocrit 39.0 - 52.0 % 34.7(L)  Platelets 150 - 400 K/uL 67(L)    RADIOGRAPHIC STUDIES: I have personally reviewed the radiological images as listed and agreed with the findings in the report.  Dg Chest 1 View  Result Date: 08/06/2018 CLINICAL DATA:  Endotracheal tube placement. EXAM: CHEST  1 VIEW 1:52 p.m. COMPARISON:  08/06/2018 at 7:45 a.m. FINDINGS: Endotracheal tube has been inserted and the tip is 3 cm above the carina. OG tube tip is below the diaphragm. There is progressive consolidation at the right lung base with air bronchograms. This involves at least the right middle lobe. New slight atelectasis at the left lung base. Heart size and vascularity are normal. No acute bone abnormality. Known multiple myeloma. IMPRESSION: Endotracheal tube and OG tube appear in good position. Progressive consolidation at the right lung base. Electronically Signed   By: Lorriane Shire M.D.   On: 08/06/2018 14:07   Dg Chest 2 View  Result Date: 08/05/2018 CLINICAL DATA:  Dyspnea EXAM: CHEST - 2 VIEW COMPARISON:  None. FINDINGS: Normal heart size. Normal mediastinal contour. No pneumothorax. No pleural effusion. Patchy right middle and lower lobe consolidation. No pulmonary edema. IMPRESSION:  Patchy right middle and right lower lobe consolidation, probably multilobar pneumonia. Recommend  follow-up PA and lateral post treatment chest radiographs in 4-6 weeks. Electronically Signed   By: Ilona Sorrel M.D.   On: 08/05/2018 21:31   Dg Chest Port 1 View  Result Date: 08/06/2018 CLINICAL DATA:  Acute respiratory failure. EXAM: PORTABLE CHEST 1 VIEW COMPARISON:  08/05/2018. FINDINGS: Mild cardiomegaly with mild pulmonary venous congestion. Persistent right middle lobe dense atelectasis/infiltrate. Small right pleural effusion. No pneumothorax. Prior cervical spine fusion. IMPRESSION: 1. Persistent right middle lobe dense atelectasis/consolidation. Right middle lobe pneumonia is most likely present. Small right pleural effusion. 2. Cardiomegaly with mild pulmonary venous congestion. Electronically Signed   By: Marcello Moores  Register   On: 08/06/2018 08:05   Dg Abd Portable 1v  Result Date: 08/06/2018 CLINICAL DATA:  OG tube placement. EXAM: PORTABLE ABDOMEN - 1 VIEW COMPARISON:  Scout image for CT scan dated 03/15/2016 FINDINGS: OG tube tip is in the mid stomach. Endotracheal tube tip is 3 cm above the carina. Focal consolidation at the right lung base. No visible dilated bowel. IMPRESSION: OG tube tip in the mid stomach. Consolidation at the right lung base. Electronically Signed   By: Lorriane Shire M.D.   On: 08/06/2018 14:04   [place] Ekg Site Rite  Result Date: 08/06/2018 If Site Rite image not attached, placement could not be confirmed due to current cardiac rhythm.   Assessment and plan- Patient is a 62 y.o. male with a history of multiple myeloma, status post bone marrow transplant in March 2018, presented to emergency room for evaluation of fatigue and shortness of breath.  T-max 103. Currently admitted to ICU, and being intubated.  #Febrile neutropenia/septic shock Broad-spectrum IV antibiotics with vancomycin and cephapirin.  Follow culture results. Obtain respiratory virus panel.    Neutropenia likely secondary to bone marrow suppression due to severe infection /bone marrow suppression due to Revlimid. Recommend to proceed with G CSF support with granix 431mg x1, ordered.  [facility]   #Acute respiratory failure due to pneumonia, intubated.  # Thrombocytopenia, platelet count at 67,000, likely due to bone marrow suppression from sepsis.   #History of multiple myeloma, Per [facility] oncology records, patient has been doing well on maintenance Revlimid.  Hold Revlimid. He is up to date for all his post-transplant immunization.    Thank you for allowing me to participate in the care of this patient.   Total face to face encounter time for this patient visit was 70 min. >50% of the time was  spent in counseling and coordination of care.    ZEarlie Server MD, PhD Hematology Oncology [facility]Gr[facility]at [facility]outh[facility] Surgery CenterPager- [phone]/31/2019

## 2018-08-06 NOTE — Progress Notes (Signed)
Pharmacy ICU Monitoring Consult:  Pharmacy consulted to assist in monitoring and replacing electrolytes, constipation, and glucose management in this 62 y.o. male admitted on 08/05/2018 with sepsis/pneumonia.   Patient intubated on 12/31 and is sedated with propofol infusion and prn fentanyl.   Labs:  Sodium (mmol/L)  Date Value  08/06/2018 138   Potassium (mmol/L)  Date Value  08/06/2018 4.2   Calcium (mg/dL)  Date Value  08/06/2018 7.1 (L)   Albumin (g/dL)  Date Value  08/05/2018 3.5  02/07/2016 4.1    Assessment/Plan: 1. Electrolytes: calcium gluconate 1g IV x 1. Will obtain all electrolytes with am labs. Will replace for goal potassium ~4 and goal mangesium ~   2. Constipation: senna/docusate 1 tab bid.   3. Glucose: will initiate SSI Q4hr.   Pharmacy will continue to monitor and adjust per consult.  Simpson,Michael L 08/06/2018 2:38 PM

## 2018-08-06 NOTE — Consult Note (Addendum)
Name: Kyle Parker MRN: [id number] DOB: 22-Jan-1956    ADMISSION DATE:  08/05/2018 CONSULTATION DATE: 08/06/2018  REFERRING MD : Dr. Jannifer Franklin   CHIEF COMPLAINT: Fever   BRIEF PATIENT DESCRIPTION:  62 yo male admitted with acute renal failure, acute hypoxic respiratory failure and septic shock with hypotension, and febrile neutropenia secondary to pneumonia   SIGNIFICANT EVENTS  12/30-Pt admitted to ICU with sepsis   HISTORY OF PRESENT ILLNESS:   This is a 62 yo male with a PMH of Multiple Myeloma s/p Bone Marrow Transplant in March 2018 (he is maintained on revlimid and acyclovir managed by [facility]), Lytic Bone Lesions, Compression Fracture of Cervical Spine at C2-C3, Neoplasm of Left Forearm (shave biopsy performed 07/10/18), Arthritis, Pneumonia (dx 04/2018 completed course of abx). He presented to [facility] ER on 12/30 with c/o fever t-max 103 degrees F, cough, chills, vomiting, and shortness of breath onset of symptoms 2 days prior to presentation. He also endorsed stomach cramps that started on 12/30.  Upon arrival to the ER O2 sats on RA were 90%, therefore he was placed on 2L O2 via nasal canula with O2 sats increasing to 94%.  Per ER notes the pt has had a cough with some sinus drainage that started 2 weeks ago.  Lab results revealed CO2 20, creatinine 3.99, bilirubin 1.6, BNP 478, troponin <0.03, wbc 1.0, hgb 12.6, platelets 92, lactic acid 3.8, respiratory panel positive for coronavirus, and CXR showed multilobar pneumonia.  He was also hypotensive with sbp in the 70's, ruling pt in for sepsis.  Therefore, he received iv azithromycin, cefepime, vancomycin, and 2L NS bolus.  He was subsequently admitted to ICU by hospitalist team for additional workup and treatment.    PAST MEDICAL HISTORY :   has a past medical history of Arthritis, Compression fracture of cervical spine at C2-C3 level, Lytic bone lesions on xray, and Multiple myeloma without remission ([place]).  has a past surgical  history that includes biopsy of the vertebral body ; CT GUIDED BONE BIOPSY (03/23/2016); Neck surgery; Colonoscopy with propofol (N/A, 10/31/2017); and polypectomy (10/31/2017). Prior to Admission medications   Medication Sig Start Date End Date Taking? Authorizing Provider  acyclovir (ZOVIRAX) 400 MG tablet Take 1 tablet (400 mg total) by mouth 2 (two) times daily. 03/21/16  Yes Cammie Sickle, MD  aspirin EC 81 MG tablet Take 162 mg by mouth daily.    Yes [provider]  gabapentin (NEURONTIN) 300 MG capsule Take 600 mg by mouth 3 (three) times daily.   Yes [provider]  omeprazole (PRILOSEC) 20 MG capsule Take 20 mg by mouth daily.   Yes [provider]  RAPAFLO 4 MG CAPS capsule Take 1 capsule (4 mg total) by mouth daily with breakfast. 04/11/18  Yes Karamalegos, Devonne Doughty, DO  Vitamin D, Ergocalciferol, (DRISDOL) 50000 units CAPS capsule Take 50,000 Units by mouth every Tuesday.    Yes [provider]  amoxicillin-clavulanate (AUGMENTIN) 875-125 MG tablet Take 1 tablet by mouth 2 (two) times daily. For 10 days Patient not taking: Reported on 08/05/2018 04/15/18   Olin Hauser, DO  baclofen (LIORESAL) 10 MG tablet Take 0.5-1 tablets (5-10 mg total) by mouth 3 (three) times daily as needed for muscle spasms. Patient not taking: Reported on 08/05/2018 04/25/18   Olin Hauser, DO  ipratropium (ATROVENT) 0.06 % nasal spray Place 2 sprays into both nostrils 4 (four) times daily. As needed. For up to 5-7 days then stop. Patient not taking:  Reported on 08/05/2018 04/11/18   Olin Hauser, DO  lenalidomide (REVLIMID) 25 MG capsule Take one capsule daily on days 1-14 every 21 days. Patient not taking: Reported on 08/05/2018 03/21/16   Cammie Sickle, MD  naproxen (NAPROSYN) 500 MG tablet TAKE 1 TABLET (500 MG TOTAL) BY MOUTH 2 (TWO) TIMES DAILY WITH A MEAL. FOR 1-2 WEEKS THEN AS NEEDED Patient not taking: Reported on  08/05/2018 03/13/18   Olin Hauser, DO  ondansetron (ZOFRAN ODT) 4 MG disintegrating tablet Take 1 tablet (4 mg total) by mouth every 8 (eight) hours as needed for nausea or vomiting. Patient not taking: Reported on 08/05/2018 04/15/18   Olin Hauser, DO  REVLIMID 10 MG capsule Take 10 mg by mouth daily.  10/25/17   [provider]   No Known Allergies  FAMILY HISTORY:  family history includes CAD in his mother; Diabetes in his mother; Diabetes Mellitus II in his mother; Heart attack in his father; Heart disease in his mother; Renal cancer in his father; Skin cancer in his mother. SOCIAL HISTORY:  reports that he has never smoked. His smokeless tobacco use includes chew. He reports that he does not drink alcohol or use drugs.  REVIEW OF SYSTEMS: Positives in BOLD  Constitutional: fever, chills, weight loss, malaise/fatigue and diaphoresis.  HENT: Negative for hearing loss, ear pain, nosebleeds, congestion, sore throat, neck pain, tinnitus and ear discharge.   Eyes: Negative for blurred vision, double vision, photophobia, pain, discharge and redness.  Respiratory: cough, hemoptysis, sputum production, shortness of breath, wheezing and stridor.   Cardiovascular: Negative for chest pain, palpitations, orthopnea, claudication, leg swelling and PND.  Gastrointestinal: Negative for heartburn, nausea, vomiting, abdominal pain, diarrhea, constipation, blood in stool and melena.  Genitourinary: Negative for dysuria, urgency, frequency, hematuria and flank pain.  Musculoskeletal: Negative for myalgias, back pain, joint pain and falls.  Skin: Negative for itching and rash.  Neurological: Negative for dizziness, tingling, tremors, sensory change, speech change, focal weakness, seizures, loss of consciousness, weakness and headaches.  Endo/Heme/Allergies: Negative for environmental allergies and polydipsia. Does not bruise/bleed easily.  SUBJECTIVE:  c/o shortness of breath,  cough, and chills   VITAL SIGNS: Temp:  [97.3 F (36.3 C)] 97.3 F (36.3 C) (12/30 2041) Pulse Rate:  [79-90] 88 (12/30 2346) Resp:  [18-46] 20 (12/30 2346) BP: (74-158)/(53-126) 91/62 (12/30 2346) SpO2:  [91 %-98 %] 93 % (12/30 2346) Weight:  [122.5 kg] 122.5 kg (12/30 2041)  PHYSICAL EXAMINATION: General: acutely ill appearing male Neuro: alert and oriented, follows commands, PERRLA HEENT: supple, no JVD  Cardiovascular: nsr, rrr, no R/G  Lungs: rhonchi throughout, slightly labored and tachypneic  Abdomen: +BS x4, soft, non tender, non distended  Musculoskeletal: normal bulk and tone, no edema  Skin: intact no rashes or lesions  Recent Labs  Lab 08/05/18 2056  NA 135  K 4.0  CL 103  CO2 20*  BUN 36*  CREATININE 3.99*  GLUCOSE 140*   Recent Labs  Lab 08/05/18 2056  HGB 12.6*  HCT 37.6*  WBC 1.0*  PLT 92*   Dg Chest 2 View  Result Date: 08/05/2018 CLINICAL DATA:  Dyspnea EXAM: CHEST - 2 VIEW COMPARISON:  None. FINDINGS: Normal heart size. Normal mediastinal contour. No pneumothorax. No pleural effusion. Patchy right middle and lower lobe consolidation. No pulmonary edema. IMPRESSION: Patchy right middle and right lower lobe consolidation, probably multilobar pneumonia. Recommend follow-up PA and lateral post treatment chest radiographs in 4-6 weeks. Electronically Signed   By:  Ilona Sorrel M.D.   On: 08/05/2018 21:31    ASSESSMENT / PLAN:  Acute hypoxic respiratory failure secondary to pneumonia  Neutropenic fevers  Supplemental O2 for dyspnea and/or hypoxia  Scheduled and prn bronchodilator therapy  Trend WBC and monitor fever curve  Trend PCT  Follow cultures  Continue azithromycin, cefepime, and vancomycin for now  Neutropenic precautions    Hypotension secondary to sepsis  Elevated BNP 478: no hx of CHF  Continuous telemetry monitoring  Aggressive fluid resuscitation to maintain map >65, if he remains hypotensive will start levophed gtt Echo pending     Acute renal failure secondary to sepsis  Lactic acidosis  Trend BMP and lactic acid  Replace electrolytes as indicated  Monitor UOP Avoid nephrotoxic medications   Multiple Myeloma s/p bone marrow transplant in March 2018 (he is maintained on revlimid and acyclovir managed by [facility]) Anemia without obvious acute blood loss Thrombocytopenia  Oncology consulted appreciate input  Trend CBC  Monitor for s/sx of bleeding  VTE px: SCD's, avoid chemical prophylaxis for now   Marda Stalker, [place] Pager [phone] (please enter 7 digits) PCCM Consult Pager [phone] (please enter 7 digits)

## 2018-08-06 NOTE — Progress Notes (Signed)
Patient ID: Kyle Parker, male   DOB: Jun 09, 1956, 62 y.o.   MRN: [id number] Neri/critical care attending  Patient with deteriorating respiratory status.  Failed BiPAP and heated high flow.  Now with abdominal thoracic paradox seen, will proceed with intubation  Procedure note Intubation Indications: Progressive hypoxemic respiratory failure Medications: Versed 2 mg, fentanyl 50 mcg and 20 mg of etomidate Glide scope utilized Patient is hyperoxygenated prior to procedure Using a #4 glide scope the vocal cords were visualized without difficulty A 7.5 endotracheal tube was passed through the vocal cords Dark bloody brownish secretions were noted coming from endotracheal tube End-tidal CO2 noted Bilateral breath sounds Stable oxygen saturation Pending stat portable chest x-ray hooked up to mechanical ventilation  Hermelinda Dellen, DO

## 2018-08-06 NOTE — Progress Notes (Signed)
Patient ID: Kyle Parker, male   DOB: [birth year], 62 y.o.   MRN: [id number] Pulmonary/critical care attending  Procedure note Central line placement Consent is obtained and on chart Indications: Hypotensive requiring pressors Right femoral location chosen Complete contact barrier precautions utilized Topical Hibiclens Under ultrasound guidance the right femoral vein was cannulated without difficulty Guidewire was placed and needle was removed Using the Seldinger technique a triple-lumen catheter was placed without difficulty The guidewire was removed intact All 3 ports had dark nonpulsatile venous return and were flushed Sutured into place Dressed by nurse 0 blood loss patient tolerated procedure well  Hermelinda Dellen, DO

## 2018-08-06 NOTE — Consult Note (Signed)
NAME: Kyle Parker  DOB: [birth year]  MRN: [id number]  Date/Time: 08/06/2018 3:49 PM  Kyle Parker Subjective:  REASON FOR CONSULT: neutropenia with sepsis and pneumonia Pt intubated- spoke to his wife in detail and chart reviewed ? Kyle Parker is a 62 y.o. with a history of multiple myeloma status post autologous bone marrow transplantation March 2018 , now in remission and on lenalidoamide and followed at [facility] oncology presents with fever and acute shortness of breath of 1 day duration. Pt had a 2 week h/o of cough and cold and was on  delsym.he did not have any fever and the cough did not stop him from doing his regular chores.  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 In the night he started feeling bad and having chills, fever ( temp of 100). He was given 2 tylenol, 4 advil X2 over a 24 hour period. On Monday he was gradually getting worse and became SOB in the evening and wife brought him to the ED. Temp of 102.1 CXR showed b/l infitrate. He was started on ceftriaxone and azithromycin and as his condition deteriorated and he had to be intubated the antibiotics were broadened and I am asked to see the patient. As per his wife no travel in many months No steroid use No recent antibiotic ( last in Sept/oct) No water sports No contact with farm animals No insect bites No sick contacts Did not get his flu vaccine Has hardware in his cervical spine  Past medical history Multiple myeloma  09/17/17 Normocellular bone marrow (30%) with slightly left-shifted myelopoiesis.Slight polytypic plasmacytosis (5%). Negative for residual plasma cell neoplasm.   Pathological fracture of vertebra due to neoplastic disease Status  post autologous bone marrow transplant March 2018 June 2018 multiple myeloma in remission BPH Drug-induced peripheral neuropathy Pathological fracture of the cervical vertebrae Lytic lesion rib second left Pneumonia September 2019 with chest x-ray showing increased opacity left lower lobe infection versus aspiration was treated with Augmentin initially and then Levaquin for 7 days  Past Medical History:  Diagnosis Date  . Arthritis   . Compression fracture of cervical spine at C2-C3 level    Rods in neck. limited L/R movement  . Lytic bone lesions on xray   . Multiple myeloma without remission [facility])     Past Surgical History:  Procedure Laterality Date  . biopsy of the vertebral body     . COLONOSCOPY WITH PROPOFOL N/A 10/31/2017   Procedure: COLONOSCOPY WITH PROPOFOL;  Surgeon: Lin Landsman, MD;  Location: [place];  Service: Endoscopy;  Laterality: N/A;  . CT GUIDED BONE BIOPSY  03/23/2016  . NECK SURGERY    . POLYPECTOMY  10/31/2017   Procedure: POLYPECTOMY;  Surgeon: Lin Landsman, MD;  Location: [place];  Service: Endoscopy;;    SH On disability Non smoker No alcohol Lives with wife Has a small dog at home  Family History  Problem Relation Age of Onset  . Heart attack Father   . Renal cancer Father   . CAD Mother   . Diabetes Mellitus II Mother   . Heart disease Mother   . Diabetes Mother   . Skin cancer Mother  non melanoma  . Prostate cancer Neg Hx   . Colon cancer Neg Hx    No Known Allergies Home meds Aspirin Vitamin D2 Albuterol Rapaflo Gabapentin Baclofen Atrovent Augmentin Oxycodone Acyclovir Flomax Lenalidomide 10 mg once daily ? Current Facility-Administered Medications  Medication Dose Route Frequency Provider Last Rate Last Dose  . acetaminophen (TYLENOL) tablet 650 mg  650 mg Oral Q6H PRN Lance Coon, MD       Or  . acetaminophen (TYLENOL) suppository 650 mg  650 mg Rectal Q6H PRN Lance Coon,  MD      . acyclovir (ZOVIRAX) 200 MG/5ML suspension SUSP 400 mg  400 mg Per Tube BID Kyle Parker, John, DO      . [START ON 08/07/2018] aspirin chewable tablet 162 mg  162 mg Per Tube Daily Kyle Parker, John, DO      . azithromycin (ZITHROMAX) 500 mg in sodium chloride 0.9 % 250 mL IVPB  500 mg Intravenous Q24H Kyle Parker, John, DO      . budesonide (PULMICORT) nebulizer solution 0.5 mg  0.5 mg Nebulization BID Awilda Bill, NP   0.5 mg at 08/06/18 8937  . calcium gluconate 1 g/ 50 mL sodium chloride IVPB  1 g Intravenous Once Charlett Nose, RPH 50 mL/hr at 08/06/18 1503 1,000 mg at 08/06/18 1503  . ceFEPIme (MAXIPIME) 2 g in sodium chloride 0.9 % 100 mL IVPB  2 g Intravenous Q12H Lance Coon, MD   Stopped at 08/06/18 [phone]  . chlorhexidine gluconate (MEDLINE KIT) (PERIDEX) 0.12 % solution 15 mL  15 mL Mouth Rinse BID Kyle Parker, John, DO      . dexmedetomidine (PRECEDEX) 400 MCG/100ML (4 mcg/mL) infusion  0.4-1.2 mcg/kg/hr Intravenous Titrated Awilda Bill, NP 19.32 mL/hr at 08/06/18 1400 0.6 mcg/kg/hr at 08/06/18 1400  . fentaNYL (SUBLIMAZE) injection 100 mcg  100 mcg Intravenous Q15 min PRN Kyle Parker, John, DO      . fentaNYL (SUBLIMAZE) injection 100 mcg  100 mcg Intravenous Q2H PRN Kyle Parker, John, DO      . gabapentin (NEURONTIN) 250 MG/5ML solution 600 mg  600 mg Per Tube Q12H Kyle Parker, John, DO      . insulin aspart (novoLOG) injection 0-9 Units  0-9 Units Subcutaneous Q4H Charlett Nose, RPH      . ipratropium-albuterol (DUONEB) 0.5-2.5 (3) MG/3ML nebulizer solution 3 mL  3 mL Nebulization Q4H PRN Lance Coon, MD      . ipratropium-albuterol (DUONEB) 0.5-2.5 (3) MG/3ML nebulizer solution 3 mL  3 mL Nebulization Q6H Awilda Bill, NP   3 mL at 08/06/18 1417  . MEDLINE mouth rinse  15 mL Mouth Rinse 10 times per day Kyle Parker, John, DO      . midazolam PF (VERSED) injection 4 mg  4 mg Intravenous Once Kyle Parker, John, DO      . norepinephrine (LEVOPHED) 4 mg in dextrose 5 % 250 mL  (0.016 mg/mL) infusion  0-40 mcg/min Intravenous Continuous Lance Coon, MD 37.5 mL/hr at 08/06/18 1506 10 mcg/min at 08/06/18 1506  . [START ON 08/07/2018] pantoprazole sodium (PROTONIX) 40 mg/20 mL oral suspension 40 mg  40 mg Per Tube Daily Kyle Parker, John, DO      . propofol (DIPRIVAN) 1000 MG/100ML infusion  5-80 mcg/kg/min Intravenous Titrated Kyle Parker, John, DO 23.2 mL/hr at 08/06/18 1505 30 mcg/kg/min at 08/06/18 1505  . senna-docusate (Senokot-S) tablet 1 tablet  1 tablet Per Tube BID Charlett Nose, RPH      . Tbo-Filgrastim (GRANIX) injection 480 mcg  480 mcg Subcutaneous  Once Earlie Server, MD      . Derrill Memo ON 08/07/2018] vancomycin (VANCOCIN) 1,250 mg in sodium chloride 0.9 % 250 mL IVPB  1,250 mg Intravenous Q24H Charlett Nose, RPH      . vecuronium (NORCURON) injection 10 mg  10 mg Intravenous Once Kyle Parker, John, DO         Abtx:  Anti-infectives (From admission, onward)   Start     Dose/Rate Route Frequency Ordered Stop   08/07/18 0900  vancomycin (VANCOCIN) 1,250 mg in sodium chloride 0.9 % 250 mL IVPB     1,250 mg 166.7 mL/hr over 90 Minutes Intravenous Every 24 hours 08/06/18 1245     08/06/18 2300  azithromycin (ZITHROMAX) 500 mg in sodium chloride 0.9 % 250 mL IVPB  Status:  Discontinued     500 mg 250 mL/hr over 60 Minutes Intravenous Every 24 hours 08/06/18 0014 08/06/18 1241   08/06/18 2200  azithromycin (ZITHROMAX) 500 mg in sodium chloride 0.9 % 250 mL IVPB     500 mg 250 mL/hr over 60 Minutes Intravenous Every 24 hours 08/06/18 1241     08/06/18 2200  acyclovir (ZOVIRAX) 200 MG/5ML suspension SUSP 400 mg     400 mg Per Tube 2 times daily 08/06/18 1241     08/06/18 1300  acyclovir (ZOVIRAX) 200 MG capsule 400 mg  Status:  Discontinued     400 mg Oral 2 times daily 08/06/18 1231 08/06/18 1241   08/06/18 1000  ceFEPIme (MAXIPIME) 2 g in sodium chloride 0.9 % 100 mL IVPB     2 g 200 mL/hr over 30 Minutes Intravenous Every 12 hours 08/06/18 0012     08/06/18 0500   vancomycin (VANCOCIN) 1,250 mg in sodium chloride 0.9 % 250 mL IVPB  Status:  Discontinued     1,250 mg 166.7 mL/hr over 90 Minutes Intravenous Every 24 hours 08/06/18 0012 08/06/18 1245   08/06/18 0015  acyclovir (ZOVIRAX) tablet 400 mg  Status:  Discontinued     400 mg Oral 2 times daily 08/06/18 0014 08/06/18 1231   08/05/18 2215  vancomycin (VANCOCIN) IVPB 1000 mg/200 mL premix     1,000 mg 200 mL/hr over 60 Minutes Intravenous  Once 08/05/18 2202 08/05/18 2345   08/05/18 2215  ceFEPIme (MAXIPIME) 2 g in sodium chloride 0.9 % 100 mL IVPB     2 g 200 mL/hr over 30 Minutes Intravenous Once 08/05/18 2202 08/05/18 2313   08/05/18 2215  azithromycin (ZITHROMAX) 500 mg in sodium chloride 0.9 % 250 mL IVPB     500 mg 250 mL/hr over 60 Minutes Intravenous  Once 08/05/18 2203 08/05/18 2345   08/05/18 2145  cefTRIAXone (ROCEPHIN) 1 g in sodium chloride 0.9 % 100 mL IVPB  Status:  Discontinued     1 g 200 mL/hr over 30 Minutes Intravenous  Once 08/05/18 2144 08/05/18 2202   08/05/18 2145  azithromycin (ZITHROMAX) 500 mg in sodium chloride 0.9 % 250 mL IVPB  Status:  Discontinued     500 mg 250 mL/hr over 60 Minutes Intravenous  Once 08/05/18 2144 08/05/18 2202      REVIEW OF SYSTEMS:  NA Objective:  VITALS:  BP (!) 89/66 (BP Location: Right Arm)   Pulse 81   Temp 100.3 F (37.9 C) (Oral)   Resp (!) 24   Ht 6' (1.829 m)   Wt 128.8 kg   SpO2 97%   BMI 38.51 kg/m  PHYSICAL EXAM:  General: intubated and sedated Head: Normocephalic, without  obvious abnormality, atraumatic. Eyes: Conjunctivae clear, anicteric sclerae. Pupils are small ENT did not examine Neck: Supple, symmetrical, no adenopathy, thyroid: non tender no carotid bruit and no JVD. Back: did not examine Lungs: b/l air entry- crepts rt side- decreased rt lower  Heart: s1s2 Tachycardia Abdomen: Soft, non-tender,not distended. Bowel sounds normal. No masses Extremities: atraumatic, no cyanosis. No edema. No clubbing Skin:  No rashes or lesions. Or bruising Lymph: Cervical, supraclavicular normal. Neurologic: Grossly non-focal Pertinent Labs Lab Results CBC    Component Value Date/Time   WBC 0.4 (LL) 08/06/2018 0327   RBC 3.55 (L) 08/06/2018 0327   HGB 11.3 (L) 08/06/2018 0327   HGB 12.7 02/07/2016 0950   HCT 34.7 (L) 08/06/2018 0327   HCT 36.7 (L) 02/07/2016 0950   PLT 67 (L) 08/06/2018 0327   PLT 224 12/10/2015 1359   MCV 97.7 08/06/2018 0327   MCV 93 02/07/2016 0950   MCH 31.8 08/06/2018 0327   MCHC 32.6 08/06/2018 0327   RDW 14.6 08/06/2018 0327   RDW 14.3 02/07/2016 0950   LYMPHSABS 0.5 (L) 08/05/2018 2056   LYMPHSABS 2.2 02/07/2016 0950   MONOABS 0.2 08/05/2018 2056   EOSABS 0.0 08/05/2018 2056   EOSABS 0.1 02/07/2016 0950   BASOSABS 0.0 08/05/2018 2056   BASOSABS 0.0 02/07/2016 0950    CMP Latest Ref Rng & Units 08/06/2018 08/05/2018 04/11/2016  Glucose 70 - 99 mg/dL 129(H) 140(H) 95  BUN 8 - 23 mg/dL 37(H) 36(H) 19  Creatinine 0.61 - 1.24 mg/dL 3.40(H) 3.99(H) 1.23  Sodium 135 - 145 mmol/L 138 135 138  Potassium 3.5 - 5.1 mmol/L 4.2 4.0 3.9  Chloride 98 - 111 mmol/L 108 103 102  CO2 22 - 32 mmol/L 17(L) 20(L) 30  Calcium 8.9 - 10.3 mg/dL 7.1(L) 8.4(L) 8.7(L)  Total Protein 6.5 - 8.1 g/dL - 7.4 6.3(L)  Total Bilirubin 0.3 - 1.2 mg/dL - 1.6(H) 0.4  Alkaline Phos 38 - 126 U/L - 28(L) 78  AST 15 - 41 U/L - 20 14(L)  ALT 0 - 44 U/L - 19 18      Microbiology: Recent Results (from the past 240 hour(s))  Respiratory Panel by PCR     Status: Abnormal   Collection Time: 08/05/18  8:56 PM  Result Value Ref Range Status   Adenovirus NOT DETECTED NOT DETECTED Final   Coronavirus 229E NOT DETECTED NOT DETECTED Final   Coronavirus HKU1 DETECTED (A) NOT DETECTED Final   Coronavirus NL63 NOT DETECTED NOT DETECTED Final   Coronavirus OC43 NOT DETECTED NOT DETECTED Final   Metapneumovirus NOT DETECTED NOT DETECTED Final   Rhinovirus / Enterovirus NOT DETECTED NOT DETECTED Final    Influenza A NOT DETECTED NOT DETECTED Final   Influenza B NOT DETECTED NOT DETECTED Final   Parainfluenza Virus 1 NOT DETECTED NOT DETECTED Final   Parainfluenza Virus 2 NOT DETECTED NOT DETECTED Final   Parainfluenza Virus 3 NOT DETECTED NOT DETECTED Final   Parainfluenza Virus 4 NOT DETECTED NOT DETECTED Final   Respiratory Syncytial Virus NOT DETECTED NOT DETECTED Final   Bordetella pertussis NOT DETECTED NOT DETECTED Final   Chlamydophila pneumoniae NOT DETECTED NOT DETECTED Final   Mycoplasma pneumoniae NOT DETECTED NOT DETECTED Final    Comment: Performed at [facility], [place]. [address], [place], [place] [address]  Culture, blood (routine x 2)     Status: None (Preliminary result)   Collection Time: 08/05/18 10:10 PM  Result Value Ref Range Status   Specimen Description BLOOD  BLOOD RIGHT HAND  Final   Special Requests   Final    BOTTLES DRAWN AEROBIC AND ANAEROBIC Blood Culture adequate volume   Culture   Final    NO GROWTH < 12 HOURS Performed at [facility], [address] [place], [place] [address]    Report Status PENDING  Incomplete  Culture, blood (routine x 2)     Status: None (Preliminary result)   Collection Time: 08/05/18 10:11 PM  Result Value Ref Range Status   Specimen Description BLOOD RIGHT ANTECUBITAL  Final   Special Requests   Final    BOTTLES DRAWN AEROBIC AND ANAEROBIC Blood Culture results may not be optimal due to an excessive volume of blood received in culture bottles   Culture   Final    NO GROWTH < 12 HOURS Performed at [facility], [address], [place], [place] [address]    Report Status PENDING  Incomplete  MRSA PCR Screening     Status: None   Collection Time: 08/06/18 12:18 AM  Result Value Ref Range Status   MRSA by PCR NEGATIVE NEGATIVE Final    Comment:        The GeneXpert MRSA Assay (FDA approved for NASAL specimens only), is one component of a comprehensive MRSA colonization surveillance program. It is  not intended to diagnose MRSA infection nor to guide or monitor treatment for MRSA infections. Performed at [facility], [address] [place], [place] [address]    IMAGING RESULTS: ?    Impression/Recommendation ?NILE PRISK is a 62 y.o. with a history of multiple myeloma status post autologous bone marrow transplantation March 2018 , now in remission and on lenalidoamide and followed at [facility] oncology presents with fever and acute shortness of breath of 1 day duration.  ?Multilobar pneumonia with acute hypoxic resp failure- intubated- He is immune compromised secondary to MM and Lenalidomide-  Bacterial pneumonia like pneumococcus highly likely- D.D other bacteria ( MRSA nares neg so less likely MRSa pneumonia) atypical pneumonia Because of neutropenia concern for fungal but less likely. On vanco/cefepime and azithromycin-  Await cultures Will send legionella antigen urine, strep pneumo urine, Fungitell, fungal antibodies Corona virus in resp viral PCR u-unlikely the cause for this pneumonia  Neutropenia and thrombocytopenia combination of infection and bone marrow suppression by Lenalidomide. Seen by heme onc and getting GCSF   Multiple Myeloma- s/p autologous BMT  ?Cervical vertebra compression fracture- S/p surgery with harware ___________________________________________________ Discussed with his wife in great detail- discussed with his nurse Note:  This document was prepared using Dragon voice recognition software and may include unintentional dictation errors.

## 2018-08-06 NOTE — Progress Notes (Signed)
Patient ID: Kyle Parker, male   DOB: [birth year], 62 y.o.   MRN: [id number] Procedure note  Pulmonary/critical care  Bronchoscopy  Secondary to patient with immunocompromised infection and respiratory failure with diffuse infiltrates on chest x-ray discussed with family and will proceed with bronchoscopic evaluation and bronchoalveolar lavage Bronchoscope inserted through endotracheal tube Patient sedated with propofol Still having difficulty with ventilator patient dyssynchrony, vecuronium given 10 mg Bronchoscope inserted through the endotracheal tube down to the level of the mid trachea where distal tube lies Thick brownish secretions were noted, cleared Bronchoscope inserted into the right mainstem bronchus, right upper lobe bronchus, right middle lobe bronchus and right lower lobe bronchus.  No endobronchial lesions were appreciated bronchoalveolar lavage was obtained in the right lower lobe, 30 cc injected with 20 cc return Pulled back to the level of the carina and inserted into the left mainstem bronchus.  Brief visualization of the left upper lobe and left lower lobe were performed without any structural lesions and cleared of all secretions. Patient tolerated procedure well We will send appropriate specimen to Gram stain, CNS, viral and fungal studies  Hermelinda Dellen, DO

## 2018-08-06 NOTE — Progress Notes (Signed)
[place] [facility] [place] NAME: Monterius Rolf    MR#:  [id number]  DATE OF BIRTH:  1956/07/20  SUBJECTIVE:   patient on Bipap this am sleeping  REVIEW OF SYSTEMS:    Unable to obtain patient sleeping heavily arousable but does not want to answer questions    Tolerating Diet:npo      DRUG ALLERGIES:  No Known Allergies  VITALS:  Blood pressure 107/72, pulse 78, temperature 98.5 F (36.9 C), temperature source Axillary, resp. rate (!) 36, height 6' (1.829 m), weight 128.8 kg, SpO2 95 %.  PHYSICAL EXAMINATION:  Constitutional: chronically ill appearing. No distress. HENT: Normocephalic. Bipap Eyes: Conjunctivae and EOM are normal. PERRLA, no scleral icterus.  Neck: Normal ROM. Neck supple. No JVD. No tracheal deviation. CVS: RRR, S1/S2 +, no murmurs, no gallops, no carotid bruit.  Pulmonary: Effort normal, no stridor, ++ b/l rhonchi, no wheezes, rales.  Abdominal: Soft. BS +,  no distension, tenderness, rebound or guarding.  Musculoskeletal: Normal range of motion. No edema and no tenderness.  Neuro: sleeping with bipap Skin: Skin is warm and dry. No rash noted.      LABORATORY PANEL:   CBC Recent Labs  Lab 08/06/18 0327  WBC 0.4*  HGB 11.3*  HCT 34.7*  PLT 67*   ------------------------------------------------------------------------------------------------------------------  Chemistries  Recent Labs  Lab 08/05/18 2056 08/06/18 0327  NA 135 138  K 4.0 4.2  CL 103 108  CO2 20* 17*  GLUCOSE 140* 129*  BUN 36* 37*  CREATININE 3.99* 3.40*  CALCIUM 8.4* 7.1*  AST 20  --   ALT 19  --   ALKPHOS 28*  --   BILITOT 1.6*  --    ------------------------------------------------------------------------------------------------------------------  Cardiac Enzymes Recent Labs  Lab 08/05/18 2056  TROPONINI <0.03    ------------------------------------------------------------------------------------------------------------------  RADIOLOGY:  Dg Chest 2 View  Result Date: 08/05/2018 CLINICAL DATA:  Dyspnea EXAM: CHEST - 2 VIEW COMPARISON:  None. FINDINGS: Normal heart size. Normal mediastinal contour. No pneumothorax. No pleural effusion. Patchy right middle and lower lobe consolidation. No pulmonary edema. IMPRESSION: Patchy right middle and right lower lobe consolidation, probably multilobar pneumonia. Recommend follow-up PA and lateral post treatment chest radiographs in 4-6 weeks. Electronically Signed   By: Ilona Sorrel M.D.   On: 08/05/2018 21:31   Dg Chest Port 1 View  Result Date: 08/06/2018 CLINICAL DATA:  Acute respiratory failure. EXAM: PORTABLE CHEST 1 VIEW COMPARISON:  08/05/2018. FINDINGS: Mild cardiomegaly with mild pulmonary venous congestion. Persistent right middle lobe dense atelectasis/infiltrate. Small right pleural effusion. No pneumothorax. Prior cervical spine fusion. IMPRESSION: 1. Persistent right middle lobe dense atelectasis/consolidation. Right middle lobe pneumonia is most likely present. Small right pleural effusion. 2. Cardiomegaly with mild pulmonary venous congestion. Electronically Signed   By: Marcello Moores  Register   On: 08/06/2018 08:05   [place] Ekg Site Rite  Result Date: 08/06/2018 If Site Rite image not attached, placement could not be confirmed due to current cardiac rhythm.    ASSESSMENT AND PLAN:   62 year old male with history of multiple myeloma status post bone marrow transplant March 2018 who presents to the emergency room due to fever and cough.  1.  Sepsis with acute hypoxic respiratory failure due to multilobar pneumonia and neutropenia: Wean BiPAP as tolerated Continue cefepime and vancomycin to treat pneumonia Neutropenic precautions Monitor CBC  2.  Septic shock in the setting of multilobar pneumonia: Blood pressure is responded to fluids and  pressors Continue to monitor Keep MAP>65  Currently on Levophed  3 BPH: Continue Flomax 4.  Kidney injury in the setting of septic shock: Creatinine is showing some improvement Continue to hold nephrotoxic medications and BMP for a.m.  5.  Multiple myeloma: Oncology evaluation     CODE STATUS: full  TOTAL TIME TAKING CARE OF THIS PATIENT: 29 minutes.     POSSIBLE D/C 3-5 days, DEPENDING ON CLINICAL CONDITION.   Kevonna Nolte M.D on 08/06/2018 at 11:19 AM  Between 7am to 6pm - Pager - [phone] After 6pm go to [email or web address] - password [email or web address] [facility]  Office  [phone]  CC: Primary care physician; Olin Hauser, DO  Note: This dictation was prepared with Dragon dictation along with smaller phrase technology. Any transcriptional errors that result from this process are unintentional.

## 2018-08-06 NOTE — Progress Notes (Signed)
Pharmacy Antibiotic Note  TAAJ HURLBUT is a 62 y.o. male admitted on 08/05/2018 with pneumonia/sepsis. Patient has history significant for multiple myeloma and is s/p BMT and takes acyclovir and lenalidomide as an outpatient. Pharmacy has been consulted for cefepime and vancomycin dosing. ID has been consulted and patient was intubated on 12/31.   Plan: Continue cefepime 2g IV Q12hr.   Continue vancomycin 1290m IV Q24hrs for goal trough of 15-20. Will monitor renal function daily while on vancomycin. If creatinine clearance continues to improve, will need to adjust regiment on 12/31.   Height: 6' (182.9 cm) Weight: 283 lb 15.2 oz (128.8 kg) IBW/kg (Calculated) : 77.6  Temp (24hrs), Avg:98.7 F (37.1 C), Min:97.3 F (36.3 C), Max:102.2 F (39 C)  Recent Labs  Lab 08/05/18 2056 08/05/18 2210 08/06/18 0019 08/06/18 0327 08/06/18 0710  WBC 1.0*  --   --  0.4*  --   CREATININE 3.99*  --   --  3.40*  --   LATICACIDVEN  --  3.8* 3.8* 3.2* 3.1*    Estimated Creatinine Clearance: 31.7 mL/min (A) (by C-G formula based on SCr of 3.4 mg/dL (H)).    No Known Allergies  Antimicrobials this admission: Acyclovir 12/30 >>  Azithromycin 12/30 >>  Cefepime 12/30 >>  Vancomycin 12/30 >>   Dose adjustments this admission: N/A  Microbiology results: 12/30 Respiratory Panel: Coronavirus HKU1 12/30 BCx: no growth < 12 hours  12/31 MRSA PCR: negative  12/31 Tracheal Aspirate: pending   Thank you for allowing pharmacy to be a part of this patient's care.  Simpson,Michael L 08/06/2018 2:22 PM

## 2018-08-06 NOTE — Progress Notes (Signed)
Pharmacy Antibiotic Note  Kyle Parker is a 62 y.o. male admitted on 08/05/2018 with pneumonia.  Pharmacy has been consulted for vanc/cefepime dosing.  Plan: Will continue vanc 1.25g IV q24h w/ 6 hour stack  Will draw trough @ 01/03 @ 0400 prior to 4th dose Will continue cefepime 2g IV q12h -- will treat a bit more aggressively considering patient is neutropenic  Ke 0.0262 T1/2 ~ 24 hrs Goal trough 15 - 20 mcg/mL  Height: 6' (182.9 cm) Weight: 270 lb (122.5 kg) IBW/kg (Calculated) : 77.6  Temp (24hrs), Avg:97.3 F (36.3 C), Min:97.3 F (36.3 C), Max:97.3 F (36.3 C)  Recent Labs  Lab 08/05/18 2056 08/05/18 2210  WBC 1.0*  --   CREATININE 3.99*  --   LATICACIDVEN  --  3.8*    Estimated Creatinine Clearance: 26.3 mL/min (A) (by C-G formula based on SCr of 3.99 mg/dL (H)).    No Known Allergies  Thank you for allowing pharmacy to be a part of this patient's care.  Tobie Lords, PharmD, BCPS Clinical Pharmacist 08/06/2018

## 2018-08-07 ENCOUNTER — Inpatient Hospital Stay: Payer: BLUE CROSS/BLUE SHIELD

## 2018-08-07 ENCOUNTER — Inpatient Hospital Stay
Admit: 2018-08-07 | Discharge: 2018-08-07 | Disposition: A | Discharge status: Home / Self Care | Payer: BLUE CROSS/BLUE SHIELD | Attending: Critical Care Medicine | Admitting: Critical Care Medicine

## 2018-08-07 DIAGNOSIS — R5081 Fever presenting with conditions classified elsewhere: Secondary | ICD-10-CM

## 2018-08-07 LAB — BLOOD GAS, ARTERIAL
Acid-base deficit: 2.4 mmol/L — ABNORMAL HIGH (ref 0.0–2.0)
Bicarbonate: 23.2 mmol/L (ref 20.0–28.0)
FIO2: 0.3
MECHVT: 500 mL
O2 Saturation: 95.6 %
PEEP: 5 cmH2O
Patient temperature: 37
RATE: 14 resp/min
pCO2 arterial: 42 mmHg (ref 32.0–48.0)
pH, Arterial: 7.35 (ref 7.350–7.450)
pO2, Arterial: 83 mmHg (ref 83.0–108.0)

## 2018-08-07 LAB — CBC WITH DIFFERENTIAL/PLATELET
Abs Immature Granulocytes: 0.01 10*3/uL (ref 0.00–0.07)
Basophils Absolute: 0 10*3/uL (ref 0.0–0.1)
Basophils Relative: 1 %
Eosinophils Absolute: 0 10*3/uL (ref 0.0–0.5)
Eosinophils Relative: 3 %
HCT: 32.2 % — ABNORMAL LOW (ref 39.0–52.0)
Hemoglobin: 10.3 g/dL — ABNORMAL LOW (ref 13.0–17.0)
Immature Granulocytes: 1 %
Lymphocytes Relative: 21 %
Lymphs Abs: 0.2 10*3/uL — ABNORMAL LOW (ref 0.7–4.0)
MCH: 31.9 pg (ref 26.0–34.0)
MCHC: 32 g/dL (ref 30.0–36.0)
MCV: 99.7 fL (ref 80.0–100.0)
MONOS PCT: 9 %
Monocytes Absolute: 0.1 10*3/uL (ref 0.1–1.0)
NEUTROS PCT: 65 %
Neutro Abs: 0.5 10*3/uL — ABNORMAL LOW (ref 1.7–7.7)
Platelets: 60 10*3/uL — ABNORMAL LOW (ref 150–400)
RBC: 3.23 MIL/uL — ABNORMAL LOW (ref 4.22–5.81)
RDW: 14.8 % (ref 11.5–15.5)
Smear Review: UNDETERMINED
WBC: 0.8 10*3/uL — CL (ref 4.0–10.5)
nRBC: 0 % (ref 0.0–0.2)

## 2018-08-07 LAB — BASIC METABOLIC PANEL
ANION GAP: 7 (ref 5–15)
Anion gap: 8 (ref 5–15)
Anion gap: 9 (ref 5–15)
BUN: 44 mg/dL — ABNORMAL HIGH (ref 8–23)
BUN: 44 mg/dL — ABNORMAL HIGH (ref 8–23)
BUN: 45 mg/dL — ABNORMAL HIGH (ref 8–23)
CO2: 21 mmol/L — ABNORMAL LOW (ref 22–32)
CO2: 23 mmol/L (ref 22–32)
CO2: 24 mmol/L (ref 22–32)
Calcium: 6.9 mg/dL — ABNORMAL LOW (ref 8.9–10.3)
Calcium: 7 mg/dL — ABNORMAL LOW (ref 8.9–10.3)
Calcium: 7 mg/dL — ABNORMAL LOW (ref 8.9–10.3)
Chloride: 107 mmol/L (ref 98–111)
Chloride: 108 mmol/L (ref 98–111)
Chloride: 109 mmol/L (ref 98–111)
Creatinine, Ser: 2.24 mg/dL — ABNORMAL HIGH (ref 0.61–1.24)
Creatinine, Ser: 2.33 mg/dL — ABNORMAL HIGH (ref 0.61–1.24)
Creatinine, Ser: 2.49 mg/dL — ABNORMAL HIGH (ref 0.61–1.24)
GFR calc Af Amer: 31 mL/min — ABNORMAL LOW (ref >60)
GFR calc Af Amer: 34 mL/min — ABNORMAL LOW (ref >60)
GFR calc Af Amer: 35 mL/min — ABNORMAL LOW (ref >60)
GFR calc non Af Amer: 27 mL/min — ABNORMAL LOW (ref >60)
GFR calc non Af Amer: 29 mL/min — ABNORMAL LOW (ref >60)
GFR calc non Af Amer: 31 mL/min — ABNORMAL LOW (ref >60)
GLUCOSE: 119 mg/dL — AB (ref 70–99)
GLUCOSE: 101 mg/dL — AB (ref 70–99)
Glucose, Bld: 114 mg/dL — ABNORMAL HIGH (ref 70–99)
Potassium: 3.3 mmol/L — ABNORMAL LOW (ref 3.5–5.1)
Potassium: 3.6 mmol/L (ref 3.5–5.1)
Potassium: 3.7 mmol/L (ref 3.5–5.1)
Sodium: 138 mmol/L (ref 135–145)
Sodium: 138 mmol/L (ref 135–145)
Sodium: 140 mmol/L (ref 135–145)

## 2018-08-07 LAB — URINE CULTURE: CULTURE: NO GROWTH

## 2018-08-07 LAB — GLUCOSE, CAPILLARY
Glucose-Capillary: 102 mg/dL — ABNORMAL HIGH (ref 70–99)
Glucose-Capillary: 106 mg/dL — ABNORMAL HIGH (ref 70–99)
Glucose-Capillary: 92 mg/dL (ref 70–99)
Glucose-Capillary: 86 mg/dL (ref 70–99)
Glucose-Capillary: 85 mg/dL (ref 70–99)

## 2018-08-07 LAB — ALBUMIN: Albumin: 2.5 g/dL — ABNORMAL LOW (ref 3.5–5.0)

## 2018-08-07 LAB — HIV ANTIBODY (ROUTINE TESTING W REFLEX): HIV Screen 4th Generation wRfx: NONREACTIVE

## 2018-08-07 LAB — PROCALCITONIN: Procalcitonin: 65.33 ng/mL

## 2018-08-07 LAB — LACTIC ACID, PLASMA: Lactic Acid, Venous: 1.8 mmol/L (ref 0.5–1.9)

## 2018-08-07 MED ORDER — SODIUM CHLORIDE 0.9 % IV SOLN
INTRAVENOUS | Status: DC | PRN
  Administered 2018-08-07: 250 mL via INTRAVENOUS

## 2018-08-07 MED ORDER — POTASSIUM CHLORIDE 20 MEQ PO PACK
40.0000 meq | PACK | Freq: Once | ORAL | Status: AC
  Administered 2018-08-07: 40 meq via ORAL
  Filled 2018-08-07: qty 2

## 2018-08-07 MED ORDER — TBO-FILGRASTIM 480 MCG/0.8ML ~~LOC~~ SOSY
480.0000 ug | PREFILLED_SYRINGE | Freq: Once | SUBCUTANEOUS | Status: AC
  Administered 2018-08-07: 480 ug via SUBCUTANEOUS
  Filled 2018-08-07: qty 0.8

## 2018-08-07 MED ORDER — SODIUM CHLORIDE 0.9 % IV SOLN
2.0000 g | INTRAVENOUS | Status: DC
  Administered 2018-08-07 – 2018-08-10 (×4): 2 g via INTRAVENOUS
  Filled 2018-08-07: qty 2
  Filled 2018-08-07: qty 20
  Filled 2018-08-07 (×3): qty 2
  Filled 2018-08-07: qty 20

## 2018-08-07 NOTE — Progress Notes (Signed)
[facility] [place] NAME: Kyle Parker    MR#:  [id number]  DATE OF BIRTH:  1956/01/03  SUBJECTIVE:  Patient currently intubated and sedated on the vent.  Had a bronchoscopy done yesterday by intensivist. Wife at bedside.  Updated on treatment plans as outlined.  REVIEW OF SYSTEMS:    Unable to obtain patient.  Patient sedated on the vent.   Tolerating Diet:npo    DRUG ALLERGIES:  No Known Allergies  VITALS:  Blood pressure 98/68, pulse 69, temperature 98.1 F (36.7 C), resp. rate 19, height 6' (1.829 m), weight 128.8 kg, SpO2 98 %.  PHYSICAL EXAMINATION:  Constitutional: Sedated on the vent. HENT: Sedated on the vent. Eyes: Conjunctivae and EOM are normal. PERRLA, no scleral icterus.  Neck: Normal ROM. Neck supple. No JVD. No tracheal deviation. CVS: RRR, S1/S2 +, no murmurs, no gallops, no carotid bruit.  Pulmonary: Effort normal, no stridor, ++ b/l rhonchi, no wheezes, rales.  Abdominal: Soft. BS +,  no distension, tenderness, rebound or guarding.  Musculoskeletal: Normal range of motion. No edema and no tenderness.  Neuro: sleeping with bipap Skin: Skin is warm and dry. No rash noted.      LABORATORY PANEL:   CBC Recent Labs  Lab 08/07/18 0555  WBC 0.8*  HGB 10.3*  HCT 32.2*  PLT 60*   ------------------------------------------------------------------------------------------------------------------  Chemistries  Recent Labs  Lab 08/05/18 2056  08/07/18 1133  NA 135   < > 140  K 4.0   < > 3.6  CL 103   < > 109  CO2 20*   < > 24  GLUCOSE 140*   < > 101*  BUN 36*   < > 44*  CREATININE 3.99*   < > 2.24*  CALCIUM 8.4*   < > 6.9*  AST 20  --   --   ALT 19  --   --   ALKPHOS 28*  --   --   BILITOT 1.6*  --   --    < > = values in this interval not displayed.   ------------------------------------------------------------------------------------------------------------------  Cardiac Enzymes Recent Labs   Lab 08/05/18 2056  TROPONINI <0.03   ------------------------------------------------------------------------------------------------------------------  RADIOLOGY:  Dg Chest 1 View  Result Date: 08/06/2018 CLINICAL DATA:  Endotracheal tube placement. EXAM: CHEST  1 VIEW 1:52 p.m. COMPARISON:  08/06/2018 at 7:45 a.m. FINDINGS: Endotracheal tube has been inserted and the tip is 3 cm above the carina. OG tube tip is below the diaphragm. There is progressive consolidation at the right lung base with air bronchograms. This involves at least the right middle lobe. New slight atelectasis at the left lung base. Heart size and vascularity are normal. No acute bone abnormality. Known multiple myeloma. IMPRESSION: Endotracheal tube and OG tube appear in good position. Progressive consolidation at the right lung base. Electronically Signed   By: Lorriane Shire M.D.   On: 08/06/2018 14:07   Dg Chest 2 View  Result Date: 08/05/2018 CLINICAL DATA:  Dyspnea EXAM: CHEST - 2 VIEW COMPARISON:  None. FINDINGS: Normal heart size. Normal mediastinal contour. No pneumothorax. No pleural effusion. Patchy right middle and lower lobe consolidation. No pulmonary edema. IMPRESSION: Patchy right middle and right lower lobe consolidation, probably multilobar pneumonia. Recommend follow-up PA and lateral post treatment chest radiographs in 4-6 weeks. Electronically Signed   By: Ilona Sorrel M.D.   On: 08/05/2018 21:31   Dg Chest Port 1 View  Result Date: 08/07/2018 CLINICAL DATA:  Acute  respiratory failure. EXAM: PORTABLE CHEST 1 VIEW COMPARISON:  08/06/2018 FINDINGS: The patient is rotated to the right. Endotracheal tube terminates 2 cm above the carina. Enteric tube courses into the left upper abdomen with tip not imaged. Lung volumes remain low. There is persistent right basilar consolidation. Milder opacity remains in the left lung base. No large pleural effusion or pneumothorax is identified. IMPRESSION: Low lung volumes  with persistent right basilar consolidation. Milder consolidation or atelectasis in the left lung base. Electronically Signed   By: Logan Bores M.D.   On: 08/07/2018 06:40   Dg Chest Port 1 View  Result Date: 08/06/2018 CLINICAL DATA:  Acute respiratory failure. EXAM: PORTABLE CHEST 1 VIEW COMPARISON:  08/05/2018. FINDINGS: Mild cardiomegaly with mild pulmonary venous congestion. Persistent right middle lobe dense atelectasis/infiltrate. Small right pleural effusion. No pneumothorax. Prior cervical spine fusion. IMPRESSION: 1. Persistent right middle lobe dense atelectasis/consolidation. Right middle lobe pneumonia is most likely present. Small right pleural effusion. 2. Cardiomegaly with mild pulmonary venous congestion. Electronically Signed   By: Marcello Moores  Register   On: 08/06/2018 08:05   Dg Abd Portable 1v  Result Date: 08/06/2018 CLINICAL DATA:  OG tube placement. EXAM: PORTABLE ABDOMEN - 1 VIEW COMPARISON:  Scout image for CT scan dated 03/15/2016 FINDINGS: OG tube tip is in the mid stomach. Endotracheal tube tip is 3 cm above the carina. Focal consolidation at the right lung base. No visible dilated bowel. IMPRESSION: OG tube tip in the mid stomach. Consolidation at the right lung base. Electronically Signed   By: Lorriane Shire M.D.   On: 08/06/2018 14:04   [place] Ekg Site Rite  Result Date: 08/06/2018 If Site Rite image not attached, placement could not be confirmed due to current cardiac rhythm.    ASSESSMENT AND PLAN:   63 year old male with history of multiple myeloma status post bone marrow transplant March 2018 who presents to the emergency room due to fever and cough.  1.  Septic shock with acute hypoxic respiratory failure due to multilobar pneumonia and neutropenia: Patient had to be intubated on 08/06/2018 due to progressive respiratory failure secondary to acute pneumonia.  Remains sedated on vent. Continue cefepime and vancomycin to treat pneumonia Neutropenic  precautions Patient had to be started on pressors.  Already weaned off vasopressin yesterday.  Currently on norepinephrine which is being gradually weaned off. Monitor CBC  2.  Septic shock in the setting of multilobar pneumonia: Being gradually weaned off Levophed as mentioned above.  Continue IV fluids. Further management by critical care physician.  3 BPH: Continue Flomax  4.  Acute Kidney injury in the setting of septic shock: Creatinine is showing some improvement Continue to hold nephrotoxic medications and BMP for a.m.  5.  Multiple myeloma: Patient status post bone marrow transplant in March 2018.   Oncology following.  Patient with neutropenia.  Plan is to receive granulocyte colony-stimulating factor.  6.  Pancytopenia Likely secondary to sepsis induced myelosuppression.  DVT prophylaxis; no heparin products due to severe thrombocytopenia. SCDs already ordered   CODE STATUS: full  TOTAL TIME TAKING CARE OF THIS PATIENT: 33 minutes.     POSSIBLE D/C 3-5 days, DEPENDING ON CLINICAL CONDITION.   Shaka Cardin M.D on 08/07/2018 at 4:18 PM  After 6pm go to [email or web address] - password [email or web address] [facility]  Office  [phone]  CC: Primary care physician; Olin Hauser, DO  Note: This dictation was prepared with Dragon dictation along with smaller phrase technology. Any transcriptional errors that result  from this process are unintentional.

## 2018-08-07 NOTE — Progress Notes (Signed)
Pharmacy ICU Monitoring Consult:  Pharmacy consulted to assist in monitoring and replacing electrolytes, constipation, and glucose management in this 63 y.o. male admitted on 08/05/2018 with sepsis/pneumonia.   Patient intubated on 12/31 and is sedated with propofol infusion and prn fentanyl.   Labs:  Sodium (mmol/L)  Date Value  08/07/2018 138   Potassium (mmol/L)  Date Value  08/07/2018 3.3 (L)   Calcium (mg/dL)  Date Value  08/07/2018 7.0 (L)   Albumin (g/dL)  Date Value  08/07/2018 2.5 (L)  02/07/2016 4.1    Assessment/Plan: 1. Electrolytes: Will replace for goal potassium ~4 and goal mangesium ~ 2.0  K - 3.3 Will order KCl powder 40 mEq once.  Ca - 7.0 with albumin 2.5.  Corrected Ca calculated to be 8.2.  Will hold replacement unless patient becomes symptomatic.  Pharmacy will continue to monitor and adjust per consult.  Forrest Moron, PharmD Clinical Pharmacist 08/07/2018 8:21 AM

## 2018-08-07 NOTE — Progress Notes (Signed)
Hematology/Oncology Progress Note [facility] Telephone:([phone] [phone]  Patient Care Team: Olin Hauser, DO as PCP - General (Family Medicine)   Name of the patient: Kyle Parker  [id number]  1956/04/28  Date of visit: 08/07/18   INTERVAL HISTORY-  Patient was seen and examined.  Wife and daughter-in-law at bedside. Remains intubated.    Review of systems- Review of Systems  Unable to perform ROS: Intubated    No Known Allergies  Patient Active Problem List   Diagnosis Date Noted  . AKI (acute kidney injury) ([place]) 08/06/2018  . Neutropenia ([place])   . Multiple myeloma in remission ([place])   . Febrile neutropenia ([place]) 08/05/2018  . Community acquired pneumonia of right lung 08/05/2018  . Sepsis ([place]) 08/05/2018  . Colon cancer screening 09/08/2017  . BPH with obstruction/lower urinary tract symptoms 09/07/2017  . Multiple myeloma without remission ([place]) 03/21/2016  . Cervical mass 03/14/2016  . Muscle spasms of neck 03/07/2016  . Tinnitus of both ears 02/07/2016  . Onychomycosis 01/23/2016     Past Medical History:  Diagnosis Date  . Arthritis   . Compression fracture of cervical spine at C2-C3 level    Rods in neck. limited L/R movement  . Lytic bone lesions on xray   . Multiple myeloma without remission [facility])      Past Surgical History:  Procedure Laterality Date  . biopsy of the vertebral body     . COLONOSCOPY WITH PROPOFOL N/A 10/31/2017   Procedure: COLONOSCOPY WITH PROPOFOL;  Surgeon: Lin Landsman, MD;  Location: [place];  Service: Endoscopy;  Laterality: N/A;  . CT GUIDED BONE BIOPSY  03/23/2016  . NECK SURGERY    . POLYPECTOMY  10/31/2017   Procedure: POLYPECTOMY;  Surgeon: Lin Landsman, MD;  Location: [place];  Service: Endoscopy;;    Social History   Socioeconomic History  . Marital status: Married    Spouse name: Not on file  . Number of children: Not on  file  . Years of education: Not on file  . Highest education level: Not on file  Occupational History  . Not on file  Social Needs  . Financial resource strain: Not on file  . Food insecurity:    Worry: Not on file    Inability: Not on file  . Transportation needs:    Medical: Not on file    Non-medical: Not on file  Tobacco Use  . Smoking status: Never Smoker  . Smokeless tobacco: Current User    Types: Chew  Substance and Sexual Activity  . Alcohol use: No    Alcohol/week: 0.0 standard drinks  . Drug use: No  . Sexual activity: Not Currently  Lifestyle  . Physical activity:    Days per week: Not on file    Minutes per session: Not on file  . Stress: Not on file  Relationships  . Social connections:    Talks on phone: Not on file    Gets together: Not on file    Attends religious service: Not on file    Active member of club or organization: Not on file    Attends meetings of clubs or organizations: Not on file    Relationship status: Not on file  . Intimate partner violence:    Fear of current or ex partner: Not on file    Emotionally abused: Not on file    Physically abused: Not on file    Forced sexual activity: Not  on file  Other Topics Concern  . Not on file  Social History Narrative  . Not on file     Family History  Problem Relation Age of Onset  . Heart attack Father   . Renal cancer Father   . CAD Mother   . Diabetes Mellitus II Mother   . Heart disease Mother   . Diabetes Mother   . Skin cancer Mother        non melanoma  . Prostate cancer Neg Hx   . Colon cancer Neg Hx      Current Facility-Administered Medications:  .  acetaminophen (TYLENOL) tablet 650 mg, 650 mg, Oral, Q6H PRN **OR** acetaminophen (TYLENOL) suppository 650 mg, 650 mg, Rectal, Q6H PRN, Lance Coon, MD .  acyclovir (ZOVIRAX) 200 MG/5ML suspension SUSP 400 mg, 400 mg, Per Tube, BID, Conforti, John, DO, 400 mg at 08/07/18 0926 .  aspirin chewable tablet 162 mg, 162 mg, Per  Tube, Daily, Conforti, John, DO, 162 mg at 08/07/18 2423 .  azithromycin (ZITHROMAX) 500 mg in sodium chloride 0.9 % 250 mL IVPB, 500 mg, Intravenous, Q24H, Conforti, John, DO, Stopped at 08/06/18 2331 .  budesonide (PULMICORT) nebulizer solution 0.5 mg, 0.5 mg, Nebulization, BID, Awilda Bill, NP, 0.5 mg at 08/07/18 5361 .  cefTRIAXone (ROCEPHIN) 2 g in sodium chloride 0.9 % 100 mL IVPB, 2 g, Intravenous, Q24H, Ravishankar, Jayashree, MD, Last Rate: 200 mL/hr at 08/07/18 1519, 2 g at 08/07/18 1519 .  chlorhexidine gluconate (MEDLINE KIT) (PERIDEX) 0.12 % solution 15 mL, 15 mL, Mouth Rinse, BID, Conforti, John, DO, 15 mL at 08/07/18 0746 .  fentaNYL (SUBLIMAZE) injection 100 mcg, 100 mcg, Intravenous, Q15 min PRN, Conforti, John, DO .  fentaNYL (SUBLIMAZE) injection 100 mcg, 100 mcg, Intravenous, Q2H PRN, Conforti, John, DO, 100 mcg at 08/07/18 1505 .  gabapentin (NEURONTIN) 250 MG/5ML solution 600 mg, 600 mg, Per Tube, Q12H, Conforti, John, DO, 600 mg at 08/07/18 1512 .  insulin aspart (novoLOG) injection 0-9 Units, 0-9 Units, Subcutaneous, Q4H, Charlett Nose, RPH .  ipratropium-albuterol (DUONEB) 0.5-2.5 (3) MG/3ML nebulizer solution 3 mL, 3 mL, Nebulization, Q4H PRN, Lance Coon, MD .  ipratropium-albuterol (DUONEB) 0.5-2.5 (3) MG/3ML nebulizer solution 3 mL, 3 mL, Nebulization, Q6H, Blakeney, Dana G, NP, 3 mL at 08/07/18 1321 .  MEDLINE mouth rinse, 15 mL, Mouth Rinse, 10 times per day, Conforti, John, DO, 15 mL at 08/07/18 1509 .  midazolam PF (VERSED) injection 4 mg, 4 mg, Intravenous, Once, Conforti, John, DO .  norepinephrine (LEVOPHED) 4 mg in dextrose 5 % 250 mL (0.016 mg/mL) infusion, 0-40 mcg/min, Intravenous, Continuous, Lance Coon, MD, Last Rate: 30 mL/hr at 08/07/18 1535, 8 mcg/min at 08/07/18 1535 .  pantoprazole sodium (PROTONIX) 40 mg/20 mL oral suspension 40 mg, 40 mg, Per Tube, Daily, Conforti, John, DO, 40 mg at 08/07/18 0927 .  propofol (DIPRIVAN) 1000 MG/100ML  infusion, 5-80 mcg/kg/min, Intravenous, Titrated, Conforti, John, DO, Last Rate: 30.9 mL/hr at 08/07/18 1508, 40 mcg/kg/min at 08/07/18 1508 .  senna-docusate (Senokot-S) tablet 1 tablet, 1 tablet, Per Tube, BID, Charlett Nose, RPH, 1 tablet at 08/07/18 4431 .  sodium chloride flush (NS) 0.9 % injection 10-40 mL, 10-40 mL, Intracatheter, Q12H, Ravishankar, Jayashree, MD, 10 mL at 08/07/18 0930 .  sodium chloride flush (NS) 0.9 % injection 10-40 mL, 10-40 mL, Intracatheter, PRN, Tsosie Billing, MD .  Tbo-Filgrastim [facility]) injection 480 mcg, 480 mcg, Subcutaneous, ONCE-1800, Earlie Server, MD .  vancomycin (VANCOCIN) 1,250 mg in sodium  chloride 0.9 % 250 mL IVPB, 1,250 mg, Intravenous, Q24H, Charlett Nose, [facility], Last Rate: 166.7 mL/hr at 08/07/18 0908, 1,250 mg at 08/07/18 0908 .  vecuronium (NORCURON) injection 10 mg, 10 mg, Intravenous, Once, Hermelinda Dellen, DO   Physical exam:  Vitals:   08/07/18 0400 08/07/18 0443 08/07/18 0715 08/07/18 1617  BP: 98/68     Pulse: 69     Resp: 19     Temp:  97.7 F (36.5 C) 98.1 F (36.7 C)   TempSrc:  Axillary    SpO2: 98%  98% 98%  Weight:      Height:       Physical Exam  Gen: Sedated on mechanical ventilation.  Resting comfortably. HEENT, neck supple, Cardiovascular, normal sinus rate. Lungs, rhonchi throughout, slightly labored and tachypneic. Abdomen, bowel sounds present in all 4 quadrants.  Nontender.  Nondistended. Muscular skeletal, no edema Skin intact, no rashes.   CMP Latest Ref Rng & Units 08/07/2018  Glucose 70 - 99 mg/dL 101(H)  BUN 8 - 23 mg/dL 44(H)  Creatinine 0.61 - 1.24 mg/dL 2.24(H)  Sodium 135 - 145 mmol/L 140  Potassium 3.5 - 5.1 mmol/L 3.6  Chloride 98 - 111 mmol/L 109  CO2 22 - 32 mmol/L 24  Calcium 8.9 - 10.3 mg/dL 6.9(L)  Total Protein 6.5 - 8.1 g/dL -  Total Bilirubin 0.3 - 1.2 mg/dL -  Alkaline Phos 38 - 126 U/L -  AST 15 - 41 U/L -  ALT 0 - 44 U/L -   CBC Latest Ref Rng & Units 08/07/2018  WBC  4.0 - 10.5 K/uL 0.8(LL)  Hemoglobin 13.0 - 17.0 g/dL 10.3(L)  Hematocrit 39.0 - 52.0 % 32.2(L)  Platelets 150 - 400 K/uL 60(L)   RADIOGRAPHIC STUDIES: I have personally reviewed the radiological images as listed and agreed with the findings in the report.  Dg Chest 1 View  Result Date: 08/06/2018 CLINICAL DATA:  Endotracheal tube placement. EXAM: CHEST  1 VIEW 1:52 p.m. COMPARISON:  08/06/2018 at 7:45 a.m. FINDINGS: Endotracheal tube has been inserted and the tip is 3 cm above the carina. OG tube tip is below the diaphragm. There is progressive consolidation at the right lung base with air bronchograms. This involves at least the right middle lobe. New slight atelectasis at the left lung base. Heart size and vascularity are normal. No acute bone abnormality. Known multiple myeloma. IMPRESSION: Endotracheal tube and OG tube appear in good position. Progressive consolidation at the right lung base. Electronically Signed   By: Lorriane Shire M.D.   On: 08/06/2018 14:07   Dg Chest 2 View  Result Date: 08/05/2018 CLINICAL DATA:  Dyspnea EXAM: CHEST - 2 VIEW COMPARISON:  None. FINDINGS: Normal heart size. Normal mediastinal contour. No pneumothorax. No pleural effusion. Patchy right middle and lower lobe consolidation. No pulmonary edema. IMPRESSION: Patchy right middle and right lower lobe consolidation, probably multilobar pneumonia. Recommend follow-up PA and lateral post treatment chest radiographs in 4-6 weeks. Electronically Signed   By: Ilona Sorrel M.D.   On: 08/05/2018 21:31   Dg Chest Port 1 View  Result Date: 08/07/2018 CLINICAL DATA:  Acute respiratory failure. EXAM: PORTABLE CHEST 1 VIEW COMPARISON:  08/06/2018 FINDINGS: The patient is rotated to the right. Endotracheal tube terminates 2 cm above the carina. Enteric tube courses into the left upper abdomen with tip not imaged. Lung volumes remain low. There is persistent right basilar consolidation. Milder opacity remains in the left lung  base. No large pleural effusion or  pneumothorax is identified. IMPRESSION: Low lung volumes with persistent right basilar consolidation. Milder consolidation or atelectasis in the left lung base. Electronically Signed   By: Logan Bores M.D.   On: 08/07/2018 06:40   Dg Chest Port 1 View  Result Date: 08/06/2018 CLINICAL DATA:  Acute respiratory failure. EXAM: PORTABLE CHEST 1 VIEW COMPARISON:  08/05/2018. FINDINGS: Mild cardiomegaly with mild pulmonary venous congestion. Persistent right middle lobe dense atelectasis/infiltrate. Small right pleural effusion. No pneumothorax. Prior cervical spine fusion. IMPRESSION: 1. Persistent right middle lobe dense atelectasis/consolidation. Right middle lobe pneumonia is most likely present. Small right pleural effusion. 2. Cardiomegaly with mild pulmonary venous congestion. Electronically Signed   By: Marcello Moores  Register   On: 08/06/2018 08:05   Dg Abd Portable 1v  Result Date: 08/06/2018 CLINICAL DATA:  OG tube placement. EXAM: PORTABLE ABDOMEN - 1 VIEW COMPARISON:  Scout image for CT scan dated 03/15/2016 FINDINGS: OG tube tip is in the mid stomach. Endotracheal tube tip is 3 cm above the carina. Focal consolidation at the right lung base. No visible dilated bowel. IMPRESSION: OG tube tip in the mid stomach. Consolidation at the right lung base. Electronically Signed   By: Lorriane Shire M.D.   On: 08/06/2018 14:04   [place] Ekg Site Rite  Result Date: 08/06/2018 If Site Rite image not attached, placement could not be confirmed due to current cardiac rhythm.   Assessment and plan-  Patient is a 63 y.o. male with a history of multiple myeloma, status post bone marrow transplant in March 2018, presented to emergency room for evaluation of fatigue and shortness of breath.  T-max 103. Currently admitted to ICU, and being intubated.  #Febrile neutropenia/septic shock Continue broad-spectrum IV antibiotics  Follow culture results.respiratory virus panel.   ID on  board Neutropenia likely secondary to bone marrow suppression due to severe infection and suppression due to Revlimid. Hold Revlimid. Received 1 dose of Granix 480 MCG x1 yesterday. ANC is slightly improved, proceed with Granix 480 MCG x1 today. Discussed with patient's family members.   # Thrombocytopenia, platelet count further decreased to 60,000.  Likely due to bone marrow suppression from septic shock.  Continue monitor CBC daily. DVT prophylaxis was hold due to thrombocytopenia.  SCD  #History of multiple myeloma, Per [facility] oncology records, patient has been doing well on maintenance Revlimid.  Hold Revlimid.  Thank you for allowing me to participate in the care of this patient.  Total face to face encounter time for this patient visit was 25 min. >50% of the time was  spent in counseling and coordination of care.   Earlie Server, MD, PhD Hematology Oncology [facility] Pager- [phone] 08/07/2018

## 2018-08-07 NOTE — Progress Notes (Signed)
ID Chart reviewed Spoke to his nurse Pt doing better, urine out put and kidney function has improved Afebrile Pressor need declining Urine posive for strep pneumoniae antigen hence this is the source of th pneumonia- De-escalated cefepime to ceftriaxone- await Bal culture finalization and then can decide on stopping vanco/zithro

## 2018-08-07 NOTE — Progress Notes (Signed)
Follow up - Critical Care Medicine Note  Patient Details:    Kyle Parker is an 63 y.o. male.PMH of Multiple Myeloma s/p Bone Marrow Transplant in March 2018 (he is maintained on revlimid and acyclovir managed by [facility]), Lytic Bone Lesions, Compression Fracture of Cervical Spine at C2-C3, Neoplasm of Left Forearm (shave biopsy performed 07/10/18), Arthritis, Pneumonia (dx 04/2018 completed course of abx). He presented to [facility] ER on 12/30 with c/o fever t-max 103 degrees F, cough, chills, vomiting, and shortness of breath onset of symptoms 2 days prior to presentation.Marland Kitchen He progressed to respiratory failure initially on heated high flow then BiPAP requiring intubation  Lines, Airways, Drains: Airway 8 mm (Active)  Secured at (cm) 25 cm 08/07/2018  7:15 AM  Measured From Lips 08/07/2018  7:15 AM  [place] 08/07/2018  7:15 AM  Secured By [employer] 08/07/2018  7:15 AM  Tube Holder Repositioned Yes 08/07/2018  3:48 AM  Cuff Pressure (cm H2O) 20 cm H2O 08/07/2018  7:15 AM  Site Condition Dry 08/07/2018  3:48 AM     CVC Triple Lumen 08/06/18 Right Femoral 20 cm (Active)  Indication for Insertion or Continuance of Line Vasoactive infusions 08/06/2018  8:00 PM  Site Assessment Clean;Dry;Intact 08/06/2018  8:00 PM  Proximal Lumen Status Flushed;Saline locked 08/06/2018  8:00 PM  Medial Lumen Status Flushed;Saline locked 08/06/2018  8:00 PM  Distal Lumen Status Infusing 08/06/2018  8:00 PM  Dressing Type Transparent;Occlusive 08/06/2018  8:00 PM  Dressing Status Clean;Dry;Intact;Antimicrobial disc in place 08/06/2018  8:00 PM  Line Care Connections checked and tightened 08/06/2018  8:00 PM  Dressing Intervention New dressing 08/06/2018  8:00 PM  Dressing Change Due 08/13/18 08/06/2018  8:00 PM     NG/OG Tube Orogastric Center mouth Xray Documented cm marking at nare/ corner of mouth 58 cm (Active)  Cm Marking at Nare/Corner of Mouth (if applicable) 60 cm 08/12/1094 12:10 AM   Site Assessment Clean;Dry;Intact 08/07/2018 12:10 AM  Ongoing Placement Verification No change in respiratory status 08/07/2018 12:10 AM  Status Suction-low intermittent 08/07/2018 12:10 AM  Amount of suction 120 mmHg 08/07/2018 12:10 AM  Drainage Appearance Brown 08/07/2018 12:10 AM  Output (mL) 100 mL 08/06/2018  7:50 PM     Urethral Catheter C Green, RN Double-lumen 16 Fr. (Active)  Indication for Insertion or Continuance of Catheter Unstable critical patients (first 24-48 hours) 08/07/2018 12:10 AM  Site Assessment Clean;Intact 08/07/2018 12:10 AM  Catheter Maintenance Bag below level of bladder;Drainage bag/tubing not touching floor;No dependent loops;Seal intact;Bag emptied prior to transport 08/07/2018 12:10 AM  Collection Container Standard drainage bag 08/07/2018 12:10 AM  Securement Method Securing device (Describe) 08/07/2018 12:10 AM  Output (mL) 600 mL 08/06/2018  4:00 PM    Anti-infectives:  Anti-infectives (From admission, onward)   Start     Dose/Rate Route Frequency Ordered Stop   08/07/18 1200  cefTRIAXone (ROCEPHIN) 2 g in sodium chloride 0.9 % 100 mL IVPB     2 g 200 mL/hr over 30 Minutes Intravenous Every 24 hours 08/07/18 0920     08/07/18 0900  vancomycin (VANCOCIN) 1,250 mg in sodium chloride 0.9 % 250 mL IVPB     1,250 mg 166.7 mL/hr over 90 Minutes Intravenous Every 24 hours 08/06/18 1245     08/06/18 2300  azithromycin (ZITHROMAX) 500 mg in sodium chloride 0.9 % 250 mL IVPB  Status:  Discontinued     500 mg 250 mL/hr over 60 Minutes Intravenous Every 24 hours 08/06/18 0014  08/06/18 1241   08/06/18 2200  azithromycin (ZITHROMAX) 500 mg in sodium chloride 0.9 % 250 mL IVPB     500 mg 250 mL/hr over 60 Minutes Intravenous Every 24 hours 08/06/18 1241     08/06/18 2200  acyclovir (ZOVIRAX) 200 MG/5ML suspension SUSP 400 mg     400 mg Per Tube 2 times daily 08/06/18 1241     08/06/18 1300  acyclovir (ZOVIRAX) 200 MG capsule 400 mg  Status:  Discontinued     400 mg Oral 2 times  daily 08/06/18 1231 08/06/18 1241   08/06/18 1000  ceFEPIme (MAXIPIME) 2 g in sodium chloride 0.9 % 100 mL IVPB  Status:  Discontinued     2 g 200 mL/hr over 30 Minutes Intravenous Every 12 hours 08/06/18 0012 08/07/18 0920   08/06/18 0500  vancomycin (VANCOCIN) 1,250 mg in sodium chloride 0.9 % 250 mL IVPB  Status:  Discontinued     1,250 mg 166.7 mL/hr over 90 Minutes Intravenous Every 24 hours 08/06/18 0012 08/06/18 1245   08/06/18 0015  acyclovir (ZOVIRAX) tablet 400 mg  Status:  Discontinued     400 mg Oral 2 times daily 08/06/18 0014 08/06/18 1231   08/05/18 2215  vancomycin (VANCOCIN) IVPB 1000 mg/200 mL premix     1,000 mg 200 mL/hr over 60 Minutes Intravenous  Once 08/05/18 2202 08/05/18 2345   08/05/18 2215  ceFEPIme (MAXIPIME) 2 g in sodium chloride 0.9 % 100 mL IVPB     2 g 200 mL/hr over 30 Minutes Intravenous Once 08/05/18 2202 08/05/18 2313   08/05/18 2215  azithromycin (ZITHROMAX) 500 mg in sodium chloride 0.9 % 250 mL IVPB     500 mg 250 mL/hr over 60 Minutes Intravenous  Once 08/05/18 2203 08/05/18 2345   08/05/18 2145  cefTRIAXone (ROCEPHIN) 1 g in sodium chloride 0.9 % 100 mL IVPB  Status:  Discontinued     1 g 200 mL/hr over 30 Minutes Intravenous  Once 08/05/18 2144 08/05/18 2202   08/05/18 2145  azithromycin (ZITHROMAX) 500 mg in sodium chloride 0.9 % 250 mL IVPB  Status:  Discontinued     500 mg 250 mL/hr over 60 Minutes Intravenous  Once 08/05/18 2144 08/05/18 2202      Microbiology: Results for orders placed or performed during the hospital encounter of 08/05/18  Respiratory Panel by PCR     Status: Abnormal   Collection Time: 08/05/18  8:56 PM  Result Value Ref Range Status   Adenovirus NOT DETECTED NOT DETECTED Final   Coronavirus 229E NOT DETECTED NOT DETECTED Final   Coronavirus HKU1 DETECTED (A) NOT DETECTED Final   Coronavirus NL63 NOT DETECTED NOT DETECTED Final   Coronavirus OC43 NOT DETECTED NOT DETECTED Final   Metapneumovirus NOT DETECTED NOT  DETECTED Final   Rhinovirus / Enterovirus NOT DETECTED NOT DETECTED Final   Influenza A NOT DETECTED NOT DETECTED Final   Influenza B NOT DETECTED NOT DETECTED Final   Parainfluenza Virus 1 NOT DETECTED NOT DETECTED Final   Parainfluenza Virus 2 NOT DETECTED NOT DETECTED Final   Parainfluenza Virus 3 NOT DETECTED NOT DETECTED Final   Parainfluenza Virus 4 NOT DETECTED NOT DETECTED Final   Respiratory Syncytial Virus NOT DETECTED NOT DETECTED Final   Bordetella pertussis NOT DETECTED NOT DETECTED Final   Chlamydophila pneumoniae NOT DETECTED NOT DETECTED Final   Mycoplasma pneumoniae NOT DETECTED NOT DETECTED Final    Comment: Performed at [facility], [address], [place], [place] [address]  Culture, blood (routine x 2)     Status: None (Preliminary result)   Collection Time: 08/05/18 10:10 PM  Result Value Ref Range Status   Specimen Description BLOOD BLOOD RIGHT HAND  Final   Special Requests   Final    BOTTLES DRAWN AEROBIC AND ANAEROBIC Blood Culture adequate volume   Culture   Final    NO GROWTH 2 DAYS Performed at [facility], [address], [place], [place] [address]    Report Status PENDING  Incomplete  Culture, blood (routine x 2)     Status: None (Preliminary result)   Collection Time: 08/05/18 10:11 PM  Result Value Ref Range Status   Specimen Description BLOOD RIGHT ANTECUBITAL  Final   Special Requests   Final    BOTTLES DRAWN AEROBIC AND ANAEROBIC Blood Culture results may not be optimal due to an excessive volume of blood received in culture bottles   Culture   Final    NO GROWTH 2 DAYS Performed at [facility], [address], [place], [place] [address]    Report Status PENDING  Incomplete  MRSA PCR Screening     Status: None   Collection Time: 08/06/18 12:18 AM  Result Value Ref Range Status   MRSA by PCR NEGATIVE NEGATIVE Final    Comment:        The GeneXpert MRSA Assay (FDA approved for NASAL specimens only), is one  component of a comprehensive MRSA colonization surveillance program. It is not intended to diagnose MRSA infection nor to guide or monitor treatment for MRSA infections. Performed at [facility], [address] [place], [place] [address]   Culture, respiratory (non-expectorated)     Status: None (Preliminary result)   Collection Time: 08/06/18  3:44 PM  Result Value Ref Range Status   Specimen Description   Final    BRONCHIAL ALVEOLAR LAVAGE Performed at [facility], [address], [place], [place] [address]    Special Requests   Final    Immunocompromised Performed at [facility], [address] [place], [place] [address]    Gram Stain   Final    MODERATE WBC PRESENT, PREDOMINANTLY PMN RARE GRAM POSITIVE COCCI Performed at [facility], [place] [address], [place], [place] [address]    Culture PENDING  Incomplete   Report Status PENDING  Incomplete   Studies: Dg Chest 1 View  Result Date: 08/06/2018 CLINICAL DATA:  Endotracheal tube placement. EXAM: CHEST  1 VIEW 1:52 p.m. COMPARISON:  08/06/2018 at 7:45 a.m. FINDINGS: Endotracheal tube has been inserted and the tip is 3 cm above the carina. OG tube tip is below the diaphragm. There is progressive consolidation at the right lung base with air bronchograms. This involves at least the right middle lobe. New slight atelectasis at the left lung base. Heart size and vascularity are normal. No acute bone abnormality. Known multiple myeloma. IMPRESSION: Endotracheal tube and OG tube appear in good position. Progressive consolidation at the right lung base. Electronically Signed   By: Lorriane Shire M.D.   On: 08/06/2018 14:07   Dg Chest 2 View  Result Date: 08/05/2018 CLINICAL DATA:  Dyspnea EXAM: CHEST - 2 VIEW COMPARISON:  None. FINDINGS: Normal heart size. Normal mediastinal contour. No pneumothorax. No pleural effusion. Patchy right middle and lower lobe consolidation. No pulmonary edema.  IMPRESSION: Patchy right middle and right lower lobe consolidation, probably multilobar pneumonia. Recommend follow-up PA and lateral post treatment chest radiographs in 4-6 weeks. Electronically Signed  By: Ilona Sorrel M.D.   On: 08/05/2018 21:31   Dg Chest Port 1 View  Result Date: 08/07/2018 CLINICAL DATA:  Acute respiratory failure. EXAM: PORTABLE CHEST 1 VIEW COMPARISON:  08/06/2018 FINDINGS: The patient is rotated to the right. Endotracheal tube terminates 2 cm above the carina. Enteric tube courses into the left upper abdomen with tip not imaged. Lung volumes remain low. There is persistent right basilar consolidation. Milder opacity remains in the left lung base. No large pleural effusion or pneumothorax is identified. IMPRESSION: Low lung volumes with persistent right basilar consolidation. Milder consolidation or atelectasis in the left lung base. Electronically Signed   By: Logan Bores M.D.   On: 08/07/2018 06:40   Dg Chest Port 1 View  Result Date: 08/06/2018 CLINICAL DATA:  Acute respiratory failure. EXAM: PORTABLE CHEST 1 VIEW COMPARISON:  08/05/2018. FINDINGS: Mild cardiomegaly with mild pulmonary venous congestion. Persistent right middle lobe dense atelectasis/infiltrate. Small right pleural effusion. No pneumothorax. Prior cervical spine fusion. IMPRESSION: 1. Persistent right middle lobe dense atelectasis/consolidation. Right middle lobe pneumonia is most likely present. Small right pleural effusion. 2. Cardiomegaly with mild pulmonary venous congestion. Electronically Signed   By: Marcello Moores  Register   On: 08/06/2018 08:05   Dg Abd Portable 1v  Result Date: 08/06/2018 CLINICAL DATA:  OG tube placement. EXAM: PORTABLE ABDOMEN - 1 VIEW COMPARISON:  Scout image for CT scan dated 03/15/2016 FINDINGS: OG tube tip is in the mid stomach. Endotracheal tube tip is 3 cm above the carina. Focal consolidation at the right lung base. No visible dilated bowel. IMPRESSION: OG tube tip in the mid  stomach. Consolidation at the right lung base. Electronically Signed   By: Lorriane Shire M.D.   On: 08/06/2018 14:04   [place] Ekg Site Rite  Result Date: 08/06/2018 If Site Rite image not attached, placement could not be confirmed due to current cardiac rhythm.   Consults: Treatment Team:  Earlie Server, MD [facility], [facility], MD   Subjective:    Overnight Issues: Yesterday patient required intubation, central line placement, pressors and bronchoscopy.  Presently resting comfortably, on bicarbonate infusion, norepinephrine and propofol Objective:  Vital signs for last 24 hours: Temp:  [97.7 F (36.5 C)-102.2 F (39 C)] 97.7 F (36.5 C) (01/01 0443) Pulse Rate:  [69-88] 69 (01/01 0400) Resp:  [9-36] 19 (01/01 0400) BP: (88-128)/(32-72) 98/68 (01/01 0400) SpO2:  [95 %-100 %] 98 % (01/01 0715) FiO2 (%):  [30 %-100 %] 30 % (01/01 0715)  Hemodynamic parameters for last 24 hours:    Intake/Output from previous day: 12/31 0701 - 01/01 0700 In: 2928.2 [I.V.:2428.3; IV Piggyback:499.8] Out: 2025 [Urine:1875; Emesis/NG output:150]  Intake/Output this shift: No intake/output data recorded.  Vent settings for last 24 hours: Vent Mode: PRVC FiO2 (%):  [30 %-100 %] 30 % Set Rate:  [16 bmp] 16 bmp Vt Set:  [500 mL] 500 mL PEEP:  [5 cmH20] 5 cmH20 Plateau Pressure:  [10 BJS28-31 cmH20] 10 cmH20  Physical Exam:  Patient is sedated on mechanical ventilation.  Resting comfortably, Neuro: alert and oriented, follows commands, PERRLA HEENT: supple, no JVD  Cardiovascular: nsr, rrr, no R/G  Lungs: rhonchi throughout, slightly labored and tachypneic  Abdomen: +BS x4, soft, non tender, non distended  Musculoskeletal: normal bulk and tone, no edema  Skin: intact no rashes or lesions  Assessment/Plan:  Ventilator dependent respiratory failure.  Patient was intubated yesterday for progressive hypoxemic respiratory failure secondary to acute pneumonia.  Status post bronchoscopy.  Chest x-ray  this  morning is consistent with right lower lobe consolidation and small lung volumes.  Thus far all cultures are negative.  Patient did have respiratory panel on admission positive for coronavirus.  Appreciate infectious disease assistance with antibiotic management  Septic shock.  Patient's pressor status has improved, weaned off of vasopressin, presently on norepinephrine, is on broad-spectrum coverage to include vancomycin, Rocephin, azithromycin and acyclovir per tube  Acid-base derangement.  Patient's status yesterday was consistent with a mixed metabolic and respiratory acidosis.  Patient's pH is now 7.4.  Will stop bicarbonate infusion  History of multiple myeloma, status post bone marrow transplant in March 2018.  Appreciate hematology/oncology.  Patient with neutropenia to receive G-CSF  Pancytopenia.  Most likely secondary to sepsis induced marrow suppression  Hypokalemia.  Will replace  Renal failure.  Stabilizing.  BUN is 45/creatinine 2.33.  Creatinine has improved from 2.49   Critical Care Total Time 40 minutes  Alainna Stawicki 08/07/2018  *Care during the described time interval was provided by me and/or other providers on the critical care team.  I have reviewed this patient's available data, including medical history, events of note, physical examination and test results as part of my evaluation.

## 2018-08-07 NOTE — Progress Notes (Signed)
Pt has made small improvements throughout the day. He has remained afebrile, with HR in 60-70s. BP has improved and MAP is above 65 and Levophed has been weaned from 14 mcgs to 4 .Urine output remains good, appr 2 L today. Family at bedside all day,. And updated on progress, will return about 10 am in am for possible SBT. Continues to have moderate rusty sputum suctioned from ET tube

## 2018-08-08 ENCOUNTER — Inpatient Hospital Stay: Payer: BLUE CROSS/BLUE SHIELD

## 2018-08-08 DIAGNOSIS — J13 Pneumonia due to Streptococcus pneumoniae: Secondary | ICD-10-CM

## 2018-08-08 DIAGNOSIS — M8448XD Pathological fracture, other site, subsequent encounter for fracture with routine healing: Secondary | ICD-10-CM

## 2018-08-08 LAB — LEGIONELLA PNEUMOPHILA SEROGP 1 UR AG: L. pneumophila Serogp 1 Ur Ag: NEGATIVE

## 2018-08-08 LAB — CBC WITH DIFFERENTIAL/PLATELET
Abs Immature Granulocytes: 0.04 10*3/uL (ref 0.00–0.07)
Basophils Absolute: 0 10*3/uL (ref 0.0–0.1)
Basophils Relative: 1 %
Eosinophils Absolute: 0.1 10*3/uL (ref 0.0–0.5)
Eosinophils Relative: 7 %
HCT: 29.8 % — ABNORMAL LOW (ref 39.0–52.0)
Hemoglobin: 9.5 g/dL — ABNORMAL LOW (ref 13.0–17.0)
Immature Granulocytes: 3 %
LYMPHS PCT: 21 %
Lymphs Abs: 0.3 10*3/uL — ABNORMAL LOW (ref 0.7–4.0)
MCH: 31.5 pg (ref 26.0–34.0)
MCHC: 31.9 g/dL (ref 30.0–36.0)
MCV: 98.7 fL (ref 80.0–100.0)
Monocytes Absolute: 0.4 10*3/uL (ref 0.1–1.0)
Monocytes Relative: 23 %
Neutro Abs: 0.7 10*3/uL — ABNORMAL LOW (ref 1.7–7.7)
Neutrophils Relative %: 45 %
Platelets: 46 10*3/uL — ABNORMAL LOW (ref 150–400)
RBC: 3.02 MIL/uL — ABNORMAL LOW (ref 4.22–5.81)
RDW: 15 % (ref 11.5–15.5)
Smear Review: NORMAL
WBC: 1.5 10*3/uL — ABNORMAL LOW (ref 4.0–10.5)
nRBC: 0 % (ref 0.0–0.2)

## 2018-08-08 LAB — BASIC METABOLIC PANEL
Anion gap: 7 (ref 5–15)
BUN: 48 mg/dL — ABNORMAL HIGH (ref 8–23)
CHLORIDE: 110 mmol/L (ref 98–111)
CO2: 23 mmol/L (ref 22–32)
CREATININE: 2.39 mg/dL — AB (ref 0.61–1.24)
Calcium: 7.2 mg/dL — ABNORMAL LOW (ref 8.9–10.3)
GFR calc Af Amer: 33 mL/min — ABNORMAL LOW (ref >60)
GFR calc non Af Amer: 28 mL/min — ABNORMAL LOW (ref >60)
Glucose, Bld: 95 mg/dL (ref 70–99)
Potassium: 3.3 mmol/L — ABNORMAL LOW (ref 3.5–5.1)
SODIUM: 140 mmol/L (ref 135–145)

## 2018-08-08 LAB — GLUCOSE, CAPILLARY
Glucose-Capillary: 102 mg/dL — ABNORMAL HIGH (ref 70–99)
Glucose-Capillary: 81 mg/dL (ref 70–99)
Glucose-Capillary: 89 mg/dL (ref 70–99)
Glucose-Capillary: 92 mg/dL (ref 70–99)
Glucose-Capillary: 94 mg/dL (ref 70–99)
Glucose-Capillary: 94 mg/dL (ref 70–99)

## 2018-08-08 LAB — MAGNESIUM: MAGNESIUM: 2 mg/dL (ref 1.7–2.4)

## 2018-08-08 LAB — PROCALCITONIN: Procalcitonin: 47.06 ng/mL

## 2018-08-08 LAB — PHOSPHORUS: Phosphorus: 3.6 mg/dL (ref 2.5–4.6)

## 2018-08-08 MED ORDER — VITAL HIGH PROTEIN PO LIQD
1000.0000 mL | ORAL | Status: DC
  Administered 2018-08-08: 1000 mL

## 2018-08-08 MED ORDER — DEXMEDETOMIDINE HCL IN NACL 400 MCG/100ML IV SOLN
0.4000 ug/kg/h | INTRAVENOUS | Status: DC
  Administered 2018-08-08: 1.2 ug/kg/h via INTRAVENOUS
  Administered 2018-08-08: 0.8 ug/kg/h via INTRAVENOUS
  Administered 2018-08-08: 0.4 ug/kg/h via INTRAVENOUS
  Administered 2018-08-08: 0.5 ug/kg/h via INTRAVENOUS
  Administered 2018-08-09: 0.4 ug/kg/h via INTRAVENOUS
  Filled 2018-08-08 (×6): qty 100

## 2018-08-08 MED ORDER — NOREPINEPHRINE 16 MG/250ML-% IV SOLN
0.0000 ug/min | INTRAVENOUS | Status: DC
  Administered 2018-08-08: 6 ug/min via INTRAVENOUS
  Filled 2018-08-08: qty 250

## 2018-08-08 MED ORDER — TBO-FILGRASTIM 480 MCG/0.8ML ~~LOC~~ SOSY
480.0000 ug | PREFILLED_SYRINGE | Freq: Once | SUBCUTANEOUS | Status: AC
  Administered 2018-08-08: 480 ug via SUBCUTANEOUS
  Filled 2018-08-08: qty 0.8

## 2018-08-08 MED ORDER — SENNOSIDES-DOCUSATE SODIUM 8.6-50 MG PO TABS: 1.0000 | ORAL_TABLET | Freq: Every day | ORAL | Status: DC

## 2018-08-08 MED ORDER — PRO-STAT SUGAR FREE PO LIQD
30.0000 mL | Freq: Three times a day (TID) | ORAL | Status: DC
  Administered 2018-08-08 – 2018-08-09 (×3): 30 mL

## 2018-08-08 MED ORDER — POTASSIUM CHLORIDE 20 MEQ/15ML (10%) PO SOLN
20.0000 meq | ORAL | Status: AC
  Administered 2018-08-08 (×2): 20 meq
  Filled 2018-08-08 (×2): qty 15

## 2018-08-08 NOTE — Progress Notes (Signed)
Pharmacy ICU Monitoring Consult:  Pharmacy consulted to assist in monitoring and replacing electrolytes, constipation, and glucose management in this 63 y.o. male admitted on 08/05/2018 with sepsis/pneumonia.   Patient intubated on 12/31 and is sedated with propofol infusion and prn fentanyl.   Labs:  Sodium (mmol/L)  Date Value  08/08/2018 140   Potassium (mmol/L)  Date Value  08/08/2018 3.3 (L)   Magnesium (mg/dL)  Date Value  08/08/2018 2.0   Phosphorus (mg/dL)  Date Value  08/08/2018 3.6   Calcium (mg/dL)  Date Value  08/08/2018 7.2 (L)   Albumin (g/dL)  Date Value  08/07/2018 2.5 (L)  02/07/2016 4.1    Assessment/Plan: 1. Electrolytes: 01/02: K: 3.3. KCl 38mEq x 2 doses already ordered.  No additional replacement needed at this time.   2. Constipation:  Last BM 01/02. Senna/Docusate BID ordered. No changes warranted at this time.   Pharmacy will continue to monitor and adjust per consult.   Pernell Dupre, PharmD, BCPS Clinical Pharmacist 08/08/2018 7:42 AM

## 2018-08-08 NOTE — Progress Notes (Signed)
Follow up - Critical Care Medicine Note  Patient Details:    Kyle Parker is an 63 y.o. male.PMH of Multiple Myeloma s/p Bone Marrow Transplant in March 2018 (he is maintained on revlimid and acyclovir managed by [facility]), Lytic Bone Lesions, Compression Fracture of Cervical Spine at C2-C3, Neoplasm of Left Forearm (shave biopsy performed 07/10/18), Arthritis, Pneumonia (dx 04/2018 completed course of abx). He presented to [facility] ER on 12/30 with c/o fever t-max 103 degrees F, cough, chills, vomiting, and shortness of breath onset of symptoms 2 days prior to presentation.Marland Kitchen He progressed to respiratory failure initially on heated high flow then BiPAP requiring intubation  Lines, Airways, Drains: Airway 8 mm (Active)  Secured at (cm) 25 cm 08/07/2018  7:15 AM  Measured From Lips 08/07/2018  7:15 AM  [place] 08/07/2018  7:15 AM  Secured By [employer] 08/07/2018  7:15 AM  Tube Holder Repositioned Yes 08/07/2018  3:48 AM  Cuff Pressure (cm H2O) 20 cm H2O 08/07/2018  7:15 AM  Site Condition Dry 08/07/2018  3:48 AM     CVC Triple Lumen 08/06/18 Right Femoral 20 cm (Active)  Indication for Insertion or Continuance of Line Vasoactive infusions 08/06/2018  8:00 PM  Site Assessment Clean;Dry;Intact 08/06/2018  8:00 PM  Proximal Lumen Status Flushed;Saline locked 08/06/2018  8:00 PM  Medial Lumen Status Flushed;Saline locked 08/06/2018  8:00 PM  Distal Lumen Status Infusing 08/06/2018  8:00 PM  Dressing Type Transparent;Occlusive 08/06/2018  8:00 PM  Dressing Status Clean;Dry;Intact;Antimicrobial disc in place 08/06/2018  8:00 PM  Line Care Connections checked and tightened 08/06/2018  8:00 PM  Dressing Intervention New dressing 08/06/2018  8:00 PM  Dressing Change Due 08/13/18 08/06/2018  8:00 PM     NG/OG Tube Orogastric Center mouth Xray Documented cm marking at nare/ corner of mouth 58 cm (Active)  Cm Marking at Nare/Corner of Mouth (if applicable) 60 cm 08/12/3844 12:10 AM   Site Assessment Clean;Dry;Intact 08/07/2018 12:10 AM  Ongoing Placement Verification No change in respiratory status 08/07/2018 12:10 AM  Status Suction-low intermittent 08/07/2018 12:10 AM  Amount of suction 120 mmHg 08/07/2018 12:10 AM  Drainage Appearance Brown 08/07/2018 12:10 AM  Output (mL) 100 mL 08/06/2018  7:50 PM     Urethral Catheter C Green, RN Double-lumen 16 Fr. (Active)  Indication for Insertion or Continuance of Catheter Unstable critical patients (first 24-48 hours) 08/07/2018 12:10 AM  Site Assessment Clean;Intact 08/07/2018 12:10 AM  Catheter Maintenance Bag below level of bladder;Drainage bag/tubing not touching floor;No dependent loops;Seal intact;Bag emptied prior to transport 08/07/2018 12:10 AM  Collection Container Standard drainage bag 08/07/2018 12:10 AM  Securement Method Securing device (Describe) 08/07/2018 12:10 AM  Output (mL) 600 mL 08/06/2018  4:00 PM    Anti-infectives:  Anti-infectives (From admission, onward)   Start     Dose/Rate Route Frequency Ordered Stop   08/07/18 1200  cefTRIAXone (ROCEPHIN) 2 g in sodium chloride 0.9 % 100 mL IVPB     2 g 200 mL/hr over 30 Minutes Intravenous Every 24 hours 08/07/18 0920     08/07/18 0900  vancomycin (VANCOCIN) 1,250 mg in sodium chloride 0.9 % 250 mL IVPB     1,250 mg 166.7 mL/hr over 90 Minutes Intravenous Every 24 hours 08/06/18 1245     08/06/18 2300  azithromycin (ZITHROMAX) 500 mg in sodium chloride 0.9 % 250 mL IVPB  Status:  Discontinued     500 mg 250 mL/hr over 60 Minutes Intravenous Every 24 hours 08/06/18 0014  08/06/18 1241   08/06/18 2200  azithromycin (ZITHROMAX) 500 mg in sodium chloride 0.9 % 250 mL IVPB     500 mg 250 mL/hr over 60 Minutes Intravenous Every 24 hours 08/06/18 1241     08/06/18 2200  acyclovir (ZOVIRAX) 200 MG/5ML suspension SUSP 400 mg     400 mg Per Tube 2 times daily 08/06/18 1241     08/06/18 1300  acyclovir (ZOVIRAX) 200 MG capsule 400 mg  Status:  Discontinued     400 mg Oral 2 times  daily 08/06/18 1231 08/06/18 1241   08/06/18 1000  ceFEPIme (MAXIPIME) 2 g in sodium chloride 0.9 % 100 mL IVPB  Status:  Discontinued     2 g 200 mL/hr over 30 Minutes Intravenous Every 12 hours 08/06/18 0012 08/07/18 0920   08/06/18 0500  vancomycin (VANCOCIN) 1,250 mg in sodium chloride 0.9 % 250 mL IVPB  Status:  Discontinued     1,250 mg 166.7 mL/hr over 90 Minutes Intravenous Every 24 hours 08/06/18 0012 08/06/18 1245   08/06/18 0015  acyclovir (ZOVIRAX) tablet 400 mg  Status:  Discontinued     400 mg Oral 2 times daily 08/06/18 0014 08/06/18 1231   08/05/18 2215  vancomycin (VANCOCIN) IVPB 1000 mg/200 mL premix     1,000 mg 200 mL/hr over 60 Minutes Intravenous  Once 08/05/18 2202 08/05/18 2345   08/05/18 2215  ceFEPIme (MAXIPIME) 2 g in sodium chloride 0.9 % 100 mL IVPB     2 g 200 mL/hr over 30 Minutes Intravenous Once 08/05/18 2202 08/05/18 2313   08/05/18 2215  azithromycin (ZITHROMAX) 500 mg in sodium chloride 0.9 % 250 mL IVPB     500 mg 250 mL/hr over 60 Minutes Intravenous  Once 08/05/18 2203 08/05/18 2345   08/05/18 2145  cefTRIAXone (ROCEPHIN) 1 g in sodium chloride 0.9 % 100 mL IVPB  Status:  Discontinued     1 g 200 mL/hr over 30 Minutes Intravenous  Once 08/05/18 2144 08/05/18 2202   08/05/18 2145  azithromycin (ZITHROMAX) 500 mg in sodium chloride 0.9 % 250 mL IVPB  Status:  Discontinued     500 mg 250 mL/hr over 60 Minutes Intravenous  Once 08/05/18 2144 08/05/18 2202      Microbiology: Results for orders placed or performed during the hospital encounter of 08/05/18  Respiratory Panel by PCR     Status: Abnormal   Collection Time: 08/05/18  8:56 PM  Result Value Ref Range Status   Adenovirus NOT DETECTED NOT DETECTED Final   Coronavirus 229E NOT DETECTED NOT DETECTED Final   Coronavirus HKU1 DETECTED (A) NOT DETECTED Final   Coronavirus NL63 NOT DETECTED NOT DETECTED Final   Coronavirus OC43 NOT DETECTED NOT DETECTED Final   Metapneumovirus NOT DETECTED NOT  DETECTED Final   Rhinovirus / Enterovirus NOT DETECTED NOT DETECTED Final   Influenza A NOT DETECTED NOT DETECTED Final   Influenza B NOT DETECTED NOT DETECTED Final   Parainfluenza Virus 1 NOT DETECTED NOT DETECTED Final   Parainfluenza Virus 2 NOT DETECTED NOT DETECTED Final   Parainfluenza Virus 3 NOT DETECTED NOT DETECTED Final   Parainfluenza Virus 4 NOT DETECTED NOT DETECTED Final   Respiratory Syncytial Virus NOT DETECTED NOT DETECTED Final   Bordetella pertussis NOT DETECTED NOT DETECTED Final   Chlamydophila pneumoniae NOT DETECTED NOT DETECTED Final   Mycoplasma pneumoniae NOT DETECTED NOT DETECTED Final    Comment: Performed at [facility], [address], [place], [place] [address]  Culture, blood (routine x 2)     Status: None (Preliminary result)   Collection Time: 08/05/18 10:10 PM  Result Value Ref Range Status   Specimen Description BLOOD BLOOD RIGHT HAND  Final   Special Requests   Final    BOTTLES DRAWN AEROBIC AND ANAEROBIC Blood Culture adequate volume   Culture   Final    NO GROWTH 3 DAYS Performed at [facility], [address] [place], [place] [address]    Report Status PENDING  Incomplete  Culture, blood (routine x 2)     Status: None (Preliminary result)   Collection Time: 08/05/18 10:11 PM  Result Value Ref Range Status   Specimen Description BLOOD RIGHT ANTECUBITAL  Final   Special Requests   Final    BOTTLES DRAWN AEROBIC AND ANAEROBIC Blood Culture results may not be optimal due to an excessive volume of blood received in culture bottles   Culture   Final    NO GROWTH 3 DAYS Performed at [facility], [address] [place], [place] [address]    Report Status PENDING  Incomplete  MRSA PCR Screening     Status: None   Collection Time: 08/06/18 12:18 AM  Result Value Ref Range Status   MRSA by PCR NEGATIVE NEGATIVE Final    Comment:        The GeneXpert MRSA Assay (FDA approved for NASAL specimens only), is one  component of a comprehensive MRSA colonization surveillance program. It is not intended to diagnose MRSA infection nor to guide or monitor treatment for MRSA infections. Performed at [facility], [address] [place], [place] [address]   Culture, respiratory (non-expectorated)     Status: None (Preliminary result)   Collection Time: 08/06/18  3:44 PM  Result Value Ref Range Status   Specimen Description   Final    BRONCHIAL ALVEOLAR LAVAGE Performed at [facility], [address], [place], [place] [address]    Special Requests   Final    Immunocompromised Performed at [facility], [address] [place], [place] [address]    Gram Stain   Final    MODERATE WBC PRESENT, PREDOMINANTLY PMN RARE GRAM POSITIVE COCCI    Culture   Final    TOO YOUNG TO READ Performed at [facility], [place] [address] [place], [place] [address]    Report Status PENDING  Incomplete  Culture, fungus without smear     Status: None (Preliminary result)   Collection Time: 08/06/18  3:44 PM  Result Value Ref Range Status   Specimen Description   Final    BRONCHIAL ALVEOLAR LAVAGE Performed at [facility], [address], [place], [place] [address]    Special Requests   Final    Immunocompromised Performed at [facility], [address] [place], [place] [address]    Culture   Final    CULTURE REINCUBATED FOR BETTER GROWTH Performed at [facility], [place] [address], [place], [place] [address]    Report Status PENDING  Incomplete  Urine culture     Status: None   Collection Time: 08/06/18  5:13 PM  Result Value Ref Range Status   Specimen Description   Final    URINE, RANDOM Performed at [facility], [address], [place], [place] [address]    Special Requests   Final    NONE Performed at [facility], [address], [place],  [address]    Culture  Final    NO GROWTH Performed at [facility], [place] [address] [place], [place] [address]    Report Status 08/07/2018 FINAL  Final   Studies: Dg Chest 1 View  Result Date: 08/06/2018 CLINICAL DATA:  Endotracheal tube placement. EXAM: CHEST  1 VIEW 1:52 p.m. COMPARISON:  08/06/2018 at 7:45 a.m. FINDINGS: Endotracheal tube has been inserted and the tip is 3 cm above the carina. OG tube tip is below the diaphragm. There is progressive consolidation at the right lung base with air bronchograms. This involves at least the right middle lobe. New slight atelectasis at the left lung base. Heart size and vascularity are normal. No acute bone abnormality. Known multiple myeloma. IMPRESSION: Endotracheal tube and OG tube appear in good position. Progressive consolidation at the right lung base. Electronically Signed   By: Lorriane Shire M.D.   On: 08/06/2018 14:07   Dg Chest 2 View  Result Date: 08/05/2018 CLINICAL DATA:  Dyspnea EXAM: CHEST - 2 VIEW COMPARISON:  None. FINDINGS: Normal heart size. Normal mediastinal contour. No pneumothorax. No pleural effusion. Patchy right middle and lower lobe consolidation. No pulmonary edema. IMPRESSION: Patchy right middle and right lower lobe consolidation, probably multilobar pneumonia. Recommend follow-up PA and lateral post treatment chest radiographs in 4-6 weeks. Electronically Signed   By: Ilona Sorrel M.D.   On: 08/05/2018 21:31   Dg Chest Port 1 View  Result Date: 08/07/2018 CLINICAL DATA:  Acute respiratory failure. EXAM: PORTABLE CHEST 1 VIEW COMPARISON:  08/06/2018 FINDINGS: The patient is rotated to the right. Endotracheal tube terminates 2 cm above the carina. Enteric tube courses into the left upper abdomen with tip not imaged. Lung volumes remain low. There is persistent right basilar consolidation. Milder opacity remains in the left lung base. No large pleural effusion or pneumothorax is identified. IMPRESSION: Low lung volumes with persistent right basilar consolidation. Milder consolidation  or atelectasis in the left lung base. Electronically Signed   By: Logan Bores M.D.   On: 08/07/2018 06:40   Dg Chest Port 1 View  Result Date: 08/06/2018 CLINICAL DATA:  Acute respiratory failure. EXAM: PORTABLE CHEST 1 VIEW COMPARISON:  08/05/2018. FINDINGS: Mild cardiomegaly with mild pulmonary venous congestion. Persistent right middle lobe dense atelectasis/infiltrate. Small right pleural effusion. No pneumothorax. Prior cervical spine fusion. IMPRESSION: 1. Persistent right middle lobe dense atelectasis/consolidation. Right middle lobe pneumonia is most likely present. Small right pleural effusion. 2. Cardiomegaly with mild pulmonary venous congestion. Electronically Signed   By: Marcello Moores  Register   On: 08/06/2018 08:05   Dg Abd Portable 1v  Result Date: 08/06/2018 CLINICAL DATA:  OG tube placement. EXAM: PORTABLE ABDOMEN - 1 VIEW COMPARISON:  Scout image for CT scan dated 03/15/2016 FINDINGS: OG tube tip is in the mid stomach. Endotracheal tube tip is 3 cm above the carina. Focal consolidation at the right lung base. No visible dilated bowel. IMPRESSION: OG tube tip in the mid stomach. Consolidation at the right lung base. Electronically Signed   By: Lorriane Shire M.D.   On: 08/06/2018 14:04   [place] Ekg Site Rite  Result Date: 08/06/2018 If Site Rite image not attached, placement could not be confirmed due to current cardiac rhythm.   Consults: Treatment Team:  Earlie Server, MD [facility], [facility], MD   Subjective:    Overnight Issues: Patient did well overnight.  No significant issues noted, does have some episodes of agitation however presently resting comfortably on propofol  Objective:  Vital signs for last 24 hours:  Temp:  [97.5 F (36.4 C)-98.5 F (36.9 C)] 97.5 F (36.4 C) (01/02 0800) Pulse Rate:  [65-81] 68 (01/02 0800) Resp:  [6-30] 14 (01/02 0800) BP: (81-103)/(54-70) 103/60 (01/02 0800) SpO2:  [96 %-98 %] 98 % (01/02 0800) FiO2 (%):  [30 %] 30 % (01/02 0746) Weight:   [127.1 kg] 127.1 kg (01/02 0500)  Hemodynamic parameters for last 24 hours:    Intake/Output from previous day: 01/01 0701 - 01/02 0700 In: 2272 [I.V.:1494.2; NG/GT:60; IV Piggyback:717.8] Out: 3095 [Urine:2625; Emesis/NG output:470]  Intake/Output this shift: No intake/output data recorded.  Vent settings for last 24 hours: Vent Mode: PRVC FiO2 (%):  [30 %] 30 % Set Rate:  [16 bmp] 16 bmp Vt Set:  [500 mL] 500 mL PEEP:  [5 cmH20] 5 cmH20 Plateau Pressure:  [15 cmH20] 15 cmH20  Physical Exam:  Patient is sedated on mechanical ventilation.  Resting comfortably, Neuro: alert and oriented, follows commands, PERRLA HEENT: supple, no JVD  Cardiovascular: nsr, rrr, no R/G  Lungs: rhonchi throughout, slightly labored and tachypneic  Abdomen: +BS x4, soft, non tender, non distended  Musculoskeletal: normal bulk and tone, no edema  Skin: intact no rashes or lesions  Assessment/Plan:  Ventilator dependent respiratory failure.  Patient was intubated yesterday for progressive hypoxemic respiratory failure secondary to acute pneumonia.  We will start spontaneous awakening and breathing trials this morning  Septic shock.  Patient's pressor status has improved, weaned off of vasopressin, presently on norepinephrine, is on broad-spectrum coverage to include vancomycin, Rocephin, azithromycin and acyclovir per tube  History of multiple myeloma, status post bone marrow transplant in March 2018.  Appreciate hematology/oncology.  Patient with neutropenia to receive G-CSF  Pancytopenia.  Most likely secondary to sepsis induced marrow suppression.  This morning's WBC is 1.5, hemoglobin 9.5 and platelet count 46  Hypokalemia.  Will replace  Renal failure.  Stabilizing.  BUN is 48/creatinine 2.39.  Creatinine has improved from 2.49   Critical Care Total Time 40 minutes  Brandley Aldrete 08/08/2018  *Care during the described time interval was provided by me and/or other providers on the critical  care team.  I have reviewed this patient's available data, including medical history, events of note, physical examination and test results as part of my evaluation. Patient ID: Kyle Parker, male   DOB: 27-Feb-1956, 64 y.o.   MRN: [id number]

## 2018-08-08 NOTE — Progress Notes (Signed)
Patient maxed out on precedex, given fentanyl bolus and patient anxious, reaching for tubes, opening eyes to commands and Wiggling toes for sister on command. MD Kasa made aware and propofol asked to be restarted.

## 2018-08-08 NOTE — Progress Notes (Signed)
[place] [facility] [place] NAME: Kyle Parker    MR#:  [id number]  DATE OF BIRTH:  12-Jun-1956  SUBJECTIVE:  Patient currently intubated and sedated on the vent.  Had a bronchoscopy done yesterday by intensivist. Wife and daughter at bedside.  Updated on treatment plans as outlined.  REVIEW OF SYSTEMS:    Unable to obtain patient.  Patient sedated on the vent.   Tolerating Diet:npo    DRUG ALLERGIES:  No Known Allergies  VITALS:  Blood pressure 106/70, pulse 62, temperature 98.1 F (36.7 C), temperature source Oral, resp. rate 16, height 6' (1.829 m), weight 127.1 kg, SpO2 96 %.  PHYSICAL EXAMINATION:  Constitutional: Sedated on the vent. HENT: Sedated on the vent. Eyes: Conjunctivae and EOM are normal. PERRLA, no scleral icterus.  Neck: Normal ROM. Neck supple. No JVD. No tracheal deviation. CVS: RRR, S1/S2 +, no murmurs, no gallops, no carotid bruit.  Pulmonary: Effort normal, no stridor, ++ b/l rhonchi, no wheezes, rales.  Abdominal: Soft. BS +,  no distension, tenderness, rebound or guarding.  Musculoskeletal: Normal range of motion. No edema and no tenderness.  Neuro: sleeping with bipap Skin: Skin is warm and dry. No rash noted.      LABORATORY PANEL:   CBC Recent Labs  Lab 08/08/18 0345  WBC 1.5*  HGB 9.5*  HCT 29.8*  PLT 46*   ------------------------------------------------------------------------------------------------------------------  Chemistries  Recent Labs  Lab 08/05/18 2056  08/08/18 0345  NA 135   < > 140  K 4.0   < > 3.3*  CL 103   < > 110  CO2 20*   < > 23  GLUCOSE 140*   < > 95  BUN 36*   < > 48*  CREATININE 3.99*   < > 2.39*  CALCIUM 8.4*   < > 7.2*  MG  --   --  2.0  AST 20  --   --   ALT 19  --   --   ALKPHOS 28*  --   --   BILITOT 1.6*  --   --    < > = values in this interval not displayed.    ------------------------------------------------------------------------------------------------------------------  Cardiac Enzymes Recent Labs  Lab 08/05/18 2056  TROPONINI <0.03   ------------------------------------------------------------------------------------------------------------------  RADIOLOGY:  Dg Chest Port 1 View  Result Date: 08/08/2018 CLINICAL DATA:  Ventilator dependent. EXAM: PORTABLE CHEST 1 VIEW COMPARISON:  08/07/2018 FINDINGS: Endotracheal tube with the tip 2.9 cm above the carina. Nasogastric tube coursing below the diaphragm. Bibasilar airspace disease likely reflecting atelectasis. Relatively low lung volumes. No pleural effusion or pneumothorax. Stable cardiomediastinal silhouette. No aggressive osseous lesion. IMPRESSION: 1. Endotracheal tube with the tip 2.9 cm above the carina. 2. Bibasilar atelectasis.  Low lung volumes. Electronically Signed   By: Kathreen Devoid   On: 08/08/2018 10:32   Dg Chest Port 1 View  Result Date: 08/07/2018 CLINICAL DATA:  Acute respiratory failure. EXAM: PORTABLE CHEST 1 VIEW COMPARISON:  08/06/2018 FINDINGS: The patient is rotated to the right. Endotracheal tube terminates 2 cm above the carina. Enteric tube courses into the left upper abdomen with tip not imaged. Lung volumes remain low. There is persistent right basilar consolidation. Milder opacity remains in the left lung base. No large pleural effusion or pneumothorax is identified. IMPRESSION: Low lung volumes with persistent right basilar consolidation. Milder consolidation or atelectasis in the left lung base. Electronically Signed   By: Logan Bores M.D.   On: 08/07/2018 06:40  ASSESSMENT AND PLAN:   63 year old male with history of multiple myeloma status post bone marrow transplant March 2018 who presents to the emergency room due to fever and cough.  1.  Septic shock with acute hypoxic respiratory failure due to multilobar pneumonia and neutropenia: Patient had to  be intubated on 08/06/2018 due to progressive respiratory failure secondary to acute pneumonia.  Remains sedated on vent. Continue cefepime and vancomycin to treat pneumonia Neutropenic precautions Patient had to be started on pressors.  Already weaned off vasopressin previously.  Currently on norepinephrine which is being gradually weaned off.  Patient was being weaned off propofol drip and started on Precedex.  Further management by critical care physician. Monitor CBC  2.  Septic shock in the setting of multilobar pneumonia: Being gradually weaned off Levophed as mentioned above.  Continue IV fluids. Further management by critical care physician.  3 BPH: Continue Flomax  4.  Acute Kidney injury in the setting of septic shock: Renal function fairly stable Continue to hold nephrotoxic medications and BMP for a.m.  5.  Multiple myeloma: Patient status post bone marrow transplant in March 2018.   Oncology following.  Patient with neutropenia.  Plan is to receive granulocyte colony-stimulating factor.  6.  Pancytopenia Monitor.  Likely secondary to sepsis induced myelosuppression.  DVT prophylaxis; no heparin products due to severe thrombocytopenia. SCDs already ordered   CODE STATUS: full  TOTAL TIME TAKING CARE OF THIS PATIENT: 32 minutes.     POSSIBLE D/C 3-5 days, DEPENDING ON CLINICAL CONDITION.   Kyle Parker M.D on 08/08/2018 at 4:05 PM  After 6pm go to [email or web address] - password [email or web address] [facility]  Office  [phone]  CC: Primary care physician; Olin Hauser, DO  Note: This dictation was prepared with Dragon dictation along with smaller phrase technology. Any transcriptional errors that result from this process are unintentional.

## 2018-08-08 NOTE — Progress Notes (Signed)
Pharmacy Antibiotic Note  Kyle Parker is a 63 y.o. male admitted on 08/05/2018 with pneumonia/sepsis. Patient has history significant for multiple myeloma and is s/p BMT and takes acyclovir and lenalidomide as an outpatient. Pharmacy has been consulted for cefepime and vancomycin dosing. ID has been consulted and patient was intubated on 12/31.   Strep pneumo urine antigen (+). Cefepime discontinued on 1/1. Ceftriaxone 2g IV every 24 hour started by ID.   Plan: Continue vancomycin 1219m IV Q24hrs for goal trough of 10-15 per Dr. RDelaine Lame Will monitor renal function daily while on vancomycin and adjust dose as needed. Vanc trough ordered 1/3 AM.   Height: 6' (182.9 cm) Weight: 280 lb 3.3 oz (127.1 kg) IBW/kg (Calculated) : 77.6  Temp (24hrs), Avg:98.1 F (36.7 C), Min:97.5 F (36.4 C), Max:98.5 F (36.9 C)  Recent Labs  Lab 08/05/18 2056 08/05/18 2210 08/06/18 0019 08/06/18 0327 08/06/18 0710 08/06/18 2350 08/07/18 0555 08/07/18 1133 08/08/18 0345  WBC 1.0*  --   --  0.4*  --   --  0.8*  --  1.5*  CREATININE 3.99*  --   --  3.40*  --  2.49* 2.33* 2.24* 2.39*  LATICACIDVEN  --  3.8* 3.8* 3.2* 3.1* 1.8  --   --   --     Estimated Creatinine Clearance: 44.7 mL/min (A) (by C-G formula based on SCr of 2.39 mg/dL (H)).    No Known Allergies  Antimicrobials this admission: Acyclovir 12/30 >>  Azithromycin 12/30 >>  Cefepime 12/30 >> 12/31 Vancomycin 12/30 >>  Ceftriaxone 01/01>>  Dose adjustments this admission: N/A  Microbiology results: 12/30 Respiratory Panel: Coronavirus HKU1 12/30 BCx: NG TD 12/31 MRSA PCR: negative  12/31 Tracheal Aspirate: pending  12/31 Strep pneumo urine antigen (+)  Thank you for allowing pharmacy to be a part of this patient's care.  SPernell Dupre PharmD, BCPS Clinical Pharmacist 08/08/2018 12:15 PM

## 2018-08-08 NOTE — Progress Notes (Signed)
Initial Nutrition Assessment  DOCUMENTATION CODES:   Obesity unspecified  INTERVENTION:  Initiate Vital High Protein at 60 mL/hr (1440 mL goal daily volume) + Pro-Stat 30 mL TID per OGT. Provides 1740 kcal, 171 grams of protein, 1210 mL H2O daily.  Provide a minimum free water flush of 30 mL Q4hrs to maintain tube patency.  Goal TF regimen meets 100% RDI for vitamins/minerals.  NUTRITION DIAGNOSIS:   Inadequate oral intake related to inability to eat as evidenced by NPO status.  GOAL:   Provide needs based on ASPEN/SCCM guidelines  MONITOR:   Vent status, Labs, Weight trends, TF tolerance, I & O's  REASON FOR ASSESSMENT:   Ventilator    ASSESSMENT:   63 year old male with PMHx of arthritis, multiple myeloma s/p autologous BMT in 10/2016 on maintenance revlimid admitted with progressive hypoxemic respiratory failure secondary to acute PNA requiring intubation on 12/31, septic shock, pancytopenia, renal failure.   Patient intubated and sedated. On PRVC mode with FiO2 30% and PEEP 5 cmH2O. Abdomen soft. Patient had a large type 6 BM today. Skin intact. Family member in room reports patient's appetite and intake have remained good during treatment and he has been weight stable. Patient is currently 127.1 kg (280.2 lbs).  Enteral Access: OGT placed 12/31; terminates in mid stomach per abdominal x-ray 12/31; 58 cm at corner of mouth  MAP: 67-80 mmHg  Patient is currently intubated on ventilator support Ve: 16 L/min Temp (24hrs), Avg:98.1 F (36.7 C), Min:97.5 F (36.4 C), Max:98.5 F (36.9 C)  Propofol: 23.2 mL/hr (612 kcal daily)  Medications reviewed and include: acyclovir, gabapentin, Novolog 0-9 units Q4hrs, Protonix 40 mg daily per tube, senna-docusate 1 tablet QHS, azithromycin, ceftriaxone, Precedex gtt, norepinephrine gtt 6 mcg/min, propofol gtt, vancomycin.  Labs reviewed: CBG 81-94, Potassium 3.3, BUN 48, Creatinine 2.48.  I/O: 2625 mL UOP yesterday (0.9  mL/kg/hr)  Patient does not meet criteria for malnutrition.  Discussed with RN and on rounds. Plan is to start tube feeds today. Propofol gtt is being weaned and discontinued today and patient will instead be on Precedex gtt.  NUTRITION - FOCUSED PHYSICAL EXAM:    Most Recent Value  Orbital Region  No depletion  Upper Arm Region  No depletion  Thoracic and Lumbar Region  No depletion  Buccal Region  Unable to assess  Temple Region  No depletion  Clavicle Bone Region  No depletion  Clavicle and Acromion Bone Region  No depletion  Scapular Bone Region  Unable to assess  Dorsal Hand  No depletion  Patellar Region  No depletion  Anterior Thigh Region  No depletion  Posterior Calf Region  No depletion  Edema (RD Assessment)  None  Hair  Reviewed  Eyes  Unable to assess  Mouth  Unable to assess  Skin  Reviewed  Nails  Reviewed     Diet Order:   Diet Order    None     EDUCATION NEEDS:   No education needs have been identified at this time  Skin:  Skin Assessment: Reviewed RN Assessment  Last BM:  08/08/2017 - large type 6  Height:   Ht Readings from Last 1 Encounters:  08/06/18 6' (1.829 m)   Weight:   Wt Readings from Last 1 Encounters:  08/08/18 127.1 kg   Ideal Body Weight:  80.9 kg  BMI:  Body mass index is 38 kg/m.  Estimated Nutritional Needs:   Kcal:  1398-1779 (11-14 kcal/kg)  Protein:  162 grams (2 grams/kg IBW)  Fluid:  2-2.4 L/day (25-30 mL/kg IBW)  Willey Blade, MS, RD, LDN Office: [phone] Pager: [phone] After Hours/Weekend Pager: [phone]

## 2018-08-08 NOTE — Progress Notes (Signed)
Date of Admission:  08/05/2018   T   ID: Kyle Parker is a 63 y.o. male   Principal Problem:   Sepsis ([place]) Active Problems:   Multiple myeloma without remission ([place])   BPH with obstruction/lower urinary tract symptoms   Febrile neutropenia ([place])   Community acquired pneumonia of right lung   AKI (acute kidney injury) ([place])   Neutropenia ([place])   Multiple myeloma in remission ([place])    Subjective: remains intubated  Medications:  . acyclovir  400 mg Per Tube BID  . aspirin  162 mg Per Tube Daily  . budesonide (PULMICORT) nebulizer solution  0.5 mg Nebulization BID  . chlorhexidine gluconate (MEDLINE KIT)  15 mL Mouth Rinse BID  . gabapentin  600 mg Per Tube Q12H  . insulin aspart  0-9 Units Subcutaneous Q4H  . ipratropium-albuterol  3 mL Nebulization Q6H  . mouth rinse  15 mL Mouth Rinse 10 times per day  . midazolam PF  4 mg Intravenous Once  . pantoprazole sodium  40 mg Per Tube Daily  . senna-docusate  1 tablet Per Tube BID  . sodium chloride flush  10-40 mL Intracatheter Q12H  . Tbo-filgastrim (GRANIX) SQ  480 mcg Subcutaneous ONCE-1800  . vecuronium  10 mg Intravenous Once    Objective: Vital signs in last 24 hours: Temp:  [97.5 F (36.4 C)-98.5 F (36.9 C)] 97.5 F (36.4 C) (01/02 0800) Pulse Rate:  [65-81] 72 (01/02 0900) Resp:  [6-30] 13 (01/02 0900) BP: (81-107)/(54-70) 107/63 (01/02 0900) SpO2:  [96 %-98 %] 97 % (01/02 0900) FiO2 (%):  [30 %] 30 % (01/02 0746) Weight:  [127.1 kg] 127.1 kg (01/02 0500)  PHYSICAL EXAM:  General: intubated and sedated  PERL OG tube ETT Neck: Supple, symmetrical, no adenopathy, thyroid: non tender no carotid bruit and no JVD. Back: not examined Lungs: b/l air entry- crepts bases, more rt side Heart: s1s2  Abdomen: Soft, non-tender,not distended. Bowel sounds normal. No masses Extremities: atraumatic, no cyanosis. No edema. No clubbing Skin: No rashes or lesions. Or bruising Lymph: Cervical, supraclavicular  normal. Neurologic: sedated  Lab Results Recent Labs    08/07/18 0555 08/07/18 1133 08/08/18 0345  WBC 0.8*  --  1.5*  HGB 10.3*  --  9.5*  HCT 32.2*  --  29.8*  NA 138 140 140  K 3.3* 3.6 3.3*  CL 107 109 110  CO2 _0 BUN 45* 44* 48*  CREATININE 2.33* 2.24* 2.39*   Liver Panel Recent Labs    08/05/18 2056 08/07/18 0555  PROT 7.4  --   ALBUMIN 3.5 2.5*  AST 20  --   ALT 19  --   ALKPHOS 28*  --   BILITOT 1.6*  --    Sedimentation Rate No results for input(s): ESRSEDRATE in the last 72 hours. C-Reactive Protein No results for input(s): CRP in the last 72 hours.  Microbiology:  Studies/Results: Dg Chest 1 View  Result Date: 08/06/2018 CLINICAL DATA:  Endotracheal tube placement. EXAM: CHEST  1 VIEW 1:52 p.m. COMPARISON:  08/06/2018 at 7:45 a.m. FINDINGS: Endotracheal tube has been inserted and the tip is 3 cm above the carina. OG tube tip is below the diaphragm. There is progressive consolidation at the right lung base with air bronchograms. This involves at least the right middle lobe. New slight atelectasis at the left lung base. Heart size and vascularity are normal. No acute bone abnormality. Known multiple myeloma. IMPRESSION: Endotracheal tube and OG tube  appear in good position. Progressive consolidation at the right lung base. Electronically Signed   By: Lorriane Shire M.D.   On: 08/06/2018 14:07   Dg Chest Port 1 View  Result Date: 08/08/2018 CLINICAL DATA:  Ventilator dependent. EXAM: PORTABLE CHEST 1 VIEW COMPARISON:  08/07/2018 FINDINGS: Endotracheal tube with the tip 2.9 cm above the carina. Nasogastric tube coursing below the diaphragm. Bibasilar airspace disease likely reflecting atelectasis. Relatively low lung volumes. No pleural effusion or pneumothorax. Stable cardiomediastinal silhouette. No aggressive osseous lesion. IMPRESSION: 1. Endotracheal tube with the tip 2.9 cm above the carina. 2. Bibasilar atelectasis.  Low lung volumes. Electronically  Signed   By: Kathreen Devoid   On: 08/08/2018 10:32   Dg Chest Port 1 View  Result Date: 08/07/2018 CLINICAL DATA:  Acute respiratory failure. EXAM: PORTABLE CHEST 1 VIEW COMPARISON:  08/06/2018 FINDINGS: The patient is rotated to the right. Endotracheal tube terminates 2 cm above the carina. Enteric tube courses into the left upper abdomen with tip not imaged. Lung volumes remain low. There is persistent right basilar consolidation. Milder opacity remains in the left lung base. No large pleural effusion or pneumothorax is identified. IMPRESSION: Low lung volumes with persistent right basilar consolidation. Milder consolidation or atelectasis in the left lung base. Electronically Signed   By: Logan Bores M.D.   On: 08/07/2018 06:40   Dg Abd Portable 1v  Result Date: 08/06/2018 CLINICAL DATA:  OG tube placement. EXAM: PORTABLE ABDOMEN - 1 VIEW COMPARISON:  [place]out image for CT scan dated 03/15/2016 FINDINGS: OG tube tip is in the mid stomach. Endotracheal tube tip is 3 cm above the carina. Focal consolidation at the right lung base. No visible dilated bowel. IMPRESSION: OG tube tip in the mid stomach. Consolidation at the right lung base. Electronically Signed   By: Lorriane Shire M.D.   On: 08/06/2018 14:04     Assessment/Plan: Kyle Parker is a 63 y.o. with a history of multiple myeloma status post autologous bone marrow transplantation March 2018 , now in remission and on lenalidoamide and followed at [facility] oncology presents with fever and acute shortness of breath of 1 day duration.  ?Multilobar pneumonia with acute hypoxic resp failure- intubated- He is immune compromised secondary to MM and Lenalidomide-  This is pneumococcus pneumonia as  urine antigen positive- BAL culture still cooking Currently on IV ceftriaxone, IV vanco and zithomax-  Once we have the culture report and if no resistance noted can stop vanco DC zithromax   D.D other bacteria ( MRSA nares neg so less likely MRSa pneumonia)  atypical pneumonia Because of neutropenia concern for fungal but less likely.  Await cultures Will send legionella antigen urine, strep pneumo urine, Fungitell, fungal antibodies Corona virus in resp viral PCR u-unlikely the cause for this pneumonia  Neutropenia and thrombocytopenia combination of infection and bone marrow suppression by Lenalidomide. Seen by heme onc and getting GCSF- improving   Multiple Myeloma- s/p autologous BMT  ?Cervical vertebra compression fracture- S/p surgery with hardware  Discussed the management with the family and care team and micro lab

## 2018-08-09 DIAGNOSIS — R6521 Severe sepsis with septic shock: Secondary | ICD-10-CM

## 2018-08-09 DIAGNOSIS — N179 Acute kidney failure, unspecified: Secondary | ICD-10-CM

## 2018-08-09 DIAGNOSIS — J9600 Acute respiratory failure, unspecified whether with hypoxia or hypercapnia: Secondary | ICD-10-CM

## 2018-08-09 DIAGNOSIS — D61818 Other pancytopenia: Secondary | ICD-10-CM

## 2018-08-09 DIAGNOSIS — C9000 Multiple myeloma not having achieved remission: Secondary | ICD-10-CM

## 2018-08-09 DIAGNOSIS — M4852XD Collapsed vertebra, not elsewhere classified, cervical region, subsequent encounter for fracture with routine healing: Secondary | ICD-10-CM

## 2018-08-09 LAB — CBC WITH DIFFERENTIAL/PLATELET
Abs Immature Granulocytes: 0 10*3/uL (ref 0.00–0.07)
Band Neutrophils: 3 %
Basophils Absolute: 0 10*3/uL (ref 0.0–0.1)
Basophils Relative: 1 %
Eosinophils Absolute: 0.2 10*3/uL (ref 0.0–0.5)
Eosinophils Relative: 5 %
HCT: 29.6 % — ABNORMAL LOW (ref 39.0–52.0)
Hemoglobin: 9.4 g/dL — ABNORMAL LOW (ref 13.0–17.0)
Lymphocytes Relative: 20 %
Lymphs Abs: 0.6 10*3/uL — ABNORMAL LOW (ref 0.7–4.0)
MCH: 32 pg (ref 26.0–34.0)
MCHC: 31.8 g/dL (ref 30.0–36.0)
MCV: 100.7 fL — ABNORMAL HIGH (ref 80.0–100.0)
Metamyelocytes Relative: 1 %
Monocytes Absolute: 0.5 10*3/uL (ref 0.1–1.0)
Monocytes Relative: 17 %
NEUTROS ABS: 1.8 10*3/uL (ref 1.7–7.7)
Neutrophils Relative %: 53 %
Platelets: UNDETERMINED 10*3/uL (ref 150–400)
RBC: 2.94 MIL/uL — ABNORMAL LOW (ref 4.22–5.81)
RDW: 15.4 % (ref 11.5–15.5)
WBC: 3.2 10*3/uL — ABNORMAL LOW (ref 4.0–10.5)
nRBC: 0 % (ref 0.0–0.2)

## 2018-08-09 LAB — VANCOMYCIN, TROUGH: Vancomycin Tr: 12 ug/mL — ABNORMAL LOW (ref 15–20)

## 2018-08-09 LAB — GLUCOSE, CAPILLARY
GLUCOSE-CAPILLARY: 94 mg/dL (ref 70–99)
Glucose-Capillary: 104 mg/dL — ABNORMAL HIGH (ref 70–99)
Glucose-Capillary: 113 mg/dL — ABNORMAL HIGH (ref 70–99)
Glucose-Capillary: 116 mg/dL — ABNORMAL HIGH (ref 70–99)
Glucose-Capillary: 96 mg/dL (ref 70–99)
Glucose-Capillary: 97 mg/dL (ref 70–99)

## 2018-08-09 LAB — BASIC METABOLIC PANEL
ANION GAP: 5 (ref 5–15)
BUN: 58 mg/dL — ABNORMAL HIGH (ref 8–23)
CO2: 25 mmol/L (ref 22–32)
Calcium: 8.4 mg/dL — ABNORMAL LOW (ref 8.9–10.3)
Chloride: 116 mmol/L — ABNORMAL HIGH (ref 98–111)
Creatinine, Ser: 2.86 mg/dL — ABNORMAL HIGH (ref 0.61–1.24)
GFR calc Af Amer: 26 mL/min — ABNORMAL LOW (ref >60)
GFR calc non Af Amer: 23 mL/min — ABNORMAL LOW (ref >60)
Glucose, Bld: 107 mg/dL — ABNORMAL HIGH (ref 70–99)
Potassium: 4 mmol/L (ref 3.5–5.1)
Sodium: 146 mmol/L — ABNORMAL HIGH (ref 135–145)

## 2018-08-09 LAB — MAGNESIUM: Magnesium: 2.4 mg/dL (ref 1.7–2.4)

## 2018-08-09 LAB — FUNGITELL, SERUM: Fungitell Result: <31 pg/mL (ref <80)

## 2018-08-09 LAB — TRIGLYCERIDES: Triglycerides: 258 mg/dL — ABNORMAL HIGH (ref <150)

## 2018-08-09 LAB — PHOSPHORUS: Phosphorus: 4.3 mg/dL (ref 2.5–4.6)

## 2018-08-09 MED ORDER — SENNOSIDES-DOCUSATE SODIUM 8.6-50 MG PO TABS: 1.0000 | ORAL_TABLET | Freq: Every evening | ORAL | Status: DC | PRN

## 2018-08-09 MED ORDER — PHENOL 1.4 % MT LIQD
1.0000 | OROMUCOSAL | Status: DC | PRN
  Filled 2018-08-09: qty 177

## 2018-08-09 MED ORDER — VANCOMYCIN HCL IN DEXTROSE 1-5 GM/200ML-% IV SOLN
1000.0000 mg | INTRAVENOUS | Status: DC
  Administered 2018-08-10: 1000 mg via INTRAVENOUS
  Filled 2018-08-09: qty 200

## 2018-08-09 MED ORDER — FENTANYL CITRATE (PF) 100 MCG/2ML IJ SOLN
25.0000 ug | INTRAMUSCULAR | Status: DC | PRN
  Administered 2018-08-09 (×2): 25 ug via INTRAVENOUS
  Filled 2018-08-09 (×2): qty 2

## 2018-08-09 NOTE — Progress Notes (Addendum)
Pharmacy Antibiotic Note  Kyle Parker is a 63 y.o. male admitted on 08/05/2018 with pneumonia/sepsis. Patient has history significant for multiple myeloma and is s/p BMT and takes acyclovir and lenalidomide as an outpatient. Pharmacy has been consulted for cefepime and vancomycin dosing. ID has been consulted and patient was intubated on 12/31.   Strep pneumo urine antigen (+). Cefepime discontinued on 1/1. Ceftriaxone 2g IV every 24 hour started by ID.   Plan: 01/03 Vanc trough 12. Per Dr. Delaine Lame on 01/02 she prefers a vanc trough target of 10-15.  Scr also went up. Per Dr. Delaine Lame decrease vancomycin dose to 1035m IV Q24hrs. Will monitor renal function daily while on vancomycin and adjust dose as needed. Next vanc trough 1/7 or when clinically indicated.   Height: 6' (182.9 cm) Weight: 282 lb 6.6 oz (128.1 kg) IBW/kg (Calculated) : 77.6  Temp (24hrs), Avg:98.3 F (36.8 C), Min:97.7 F (36.5 C), Max:98.6 F (37 C)  Recent Labs  Lab 08/05/18 2056 08/05/18 2210 08/06/18 0019 08/06/18 0327 08/06/18 0710 08/06/18 2350 08/07/18 0555 08/07/18 1133 08/08/18 0345 08/09/18 0444 08/09/18 0818  WBC 1.0*  --   --  0.4*  --   --  0.8*  --  1.5*  --  3.2*  CREATININE 3.99*  --   --  3.40*  --  2.49* 2.33* 2.24* 2.39* 2.86*  --   LATICACIDVEN  --  3.8* 3.8* 3.2* 3.1* 1.8  --   --   --   --   --   VANCOTROUGH  --   --   --   --   --   --   --   --   --   --  12*    Estimated Creatinine Clearance: 37.5 mL/min (A) (by C-G formula based on SCr of 2.86 mg/dL (H)).    No Known Allergies  Antimicrobials this admission: Acyclovir 12/30 >>  Azithromycin 12/30 >>  Cefepime 12/30 >> 12/31 Vancomycin 12/30 >>  Ceftriaxone 01/01>>  Dose adjustments this admission: 1/3 Vanc changed from 12552mq24h to 100065m24h due to Scr increase  Microbiology results: 12/30 Respiratory Panel: Coronavirus HKU1 12/30 BCx: NG TD 12/31 MRSA PCR: negative  12/31 Tracheal Aspirate: pending  12/31  Strep pneumo urine antigen (+)  Thank you for allowing pharmacy to be a part of this patient's care.  ShePernell DupreharmD, BCPS Clinical Pharmacist 08/09/2018 9:30 AM

## 2018-08-09 NOTE — Progress Notes (Signed)
[place] NAME: Kyle Parker    MR#:  [id number]  DATE OF BIRTH:  [birth year]  SUBJECTIVE:  Patient already extubated.  Resting comfortably at bedside.  No respiratory distress.  Family at bedside. No fevers.  REVIEW OF SYSTEMS:    Unable to obtain patient.  Patient currently sleeping.  No complaints from family at bedside.     DRUG ALLERGIES:  No Known Allergies  VITALS:  Blood pressure 124/78, pulse 73, temperature 98 F (36.7 C), temperature source Oral, resp. rate (!) 23, height 6' (1.829 m), weight 128.1 kg, SpO2 97 %.  PHYSICAL EXAMINATION:   Physical Exam  Constitutional: He appears well-developed and well-nourished.  HENT:  Head: Normocephalic and atraumatic.  Eyes: Pupils are equal, round, and reactive to light. Conjunctivae are normal.  Neck: Normal range of motion.  Cardiovascular: Normal rate and regular rhythm.  Respiratory: Effort normal and breath sounds normal. No stridor.  GI: Soft. Bowel sounds are normal. He exhibits distension. There is no abdominal tenderness.  Musculoskeletal: Normal range of motion.  Neurological:  Resting comfortably at this time.  Skin: Skin is warm and dry.       LABORATORY PANEL:   CBC Recent Labs  Lab 08/09/18 0818  WBC 3.2*  HGB 9.4*  HCT 29.6*  PLT PLATELET CLUMPS NOTED ON SMEAR, UNABLE TO ESTIMATE   ------------------------------------------------------------------------------------------------------------------  Chemistries  Recent Labs  Lab 08/05/18 2056  08/09/18 0444  NA 135   < > 146*  K 4.0   < > 4.0  CL 103   < > 116*  CO2 20*   < > 25  GLUCOSE 140*   < > 107*  BUN 36*   < > 58*  CREATININE 3.99*   < > 2.86*  CALCIUM 8.4*   < > 8.4*  MG  --    < > 2.4  AST 20  --   --   ALT 19  --   --   ALKPHOS 28*  --   --   BILITOT 1.6*  --   --    < > = values in this interval not displayed.    ------------------------------------------------------------------------------------------------------------------  Cardiac Enzymes Recent Labs  Lab 08/05/18 2056  TROPONINI <0.03   ------------------------------------------------------------------------------------------------------------------  RADIOLOGY:  Dg Chest Port 1 View  Result Date: 08/08/2018 CLINICAL DATA:  Ventilator dependent. EXAM: PORTABLE CHEST 1 VIEW COMPARISON:  08/07/2018 FINDINGS: Endotracheal tube with the tip 2.9 cm above the carina. Nasogastric tube coursing below the diaphragm. Bibasilar airspace disease likely reflecting atelectasis. Relatively low lung volumes. No pleural effusion or pneumothorax. Stable cardiomediastinal silhouette. No aggressive osseous lesion. IMPRESSION: 1. Endotracheal tube with the tip 2.9 cm above the carina. 2. Bibasilar atelectasis.  Low lung volumes. Electronically Signed   By: Kathreen Devoid   On: 08/08/2018 10:32     ASSESSMENT AND PLAN:   63 year old male with history of multiple myeloma status post bone marrow transplant March 2018 who presents to the emergency room due to fever and cough.  1.  Septic shock with acute hypoxic respiratory failure due to multilobar pneumonia and neutropenia: Patient had to be intubated on 08/06/2018 due to progressive respiratory failure secondary to acute pneumonia.  Patient was initially intubated.  Already extubated this morning prior to my arrival.  Doing well post extubation. Continue cefepime and vancomycin to treat pneumonia Patient already weaned off pressors.  Vital signs including blood pressure stable.  Further management by critical care physician.  Anticipate transfer out of ICU soon. Monitor CBC  2.  Septic shock in the setting of multilobar pneumonia: Being gradually weaned off Levophed as mentioned above.  Continue IV fluids. Further management by critical care physician.  3 BPH: Continue Flomax  4.  Acute Kidney injury in the  setting of septic shock: Renal function with slight decline this morning.  Continue to hold nephrotoxic medications and BMP for a.m.  5.  Multiple myeloma: Patient status post bone marrow transplant in March 2018.   Oncology following.  Patient with neutropenia.  Plan is to receive granulocyte colony-stimulating factor.  6.  Pancytopenia Neutropenia significantly improved with white count up to 3.2 this morning.. Platelet clumped on CBC.  Follow-up on CBC in a.m.  CBC was 46 yesterday Hemoglobin stable at 9.4  DVT prophylaxis; no heparin products due to severe thrombocytopenia. SCDs already ordered   CODE STATUS: full  TOTAL TIME TAKING CARE OF THIS PATIENT: 32 minutes.     POSSIBLE D/C 2 days, DEPENDING ON CLINICAL CONDITION.   Julian Medina M.D on 08/09/2018 at 6:40 PM  After 6pm go to [email or web address] - password [email or web address] [facility]  Office  [phone]  CC: Primary care physician; Olin Hauser, DO  Note: This dictation was prepared with Dragon dictation along with smaller phrase technology. Any transcriptional errors that result from this process are unintentional.

## 2018-08-09 NOTE — Progress Notes (Signed)
Date of Admission:  08/05/2018   ID: Raymond Gurney  63 y.o. male with  Principal Problem:   Sepsis ([place]) Active Problems:   Multiple myeloma without remission ([place])   BPH with obstruction/lower urinary tract symptoms   Febrile neutropenia ([place])   Community acquired pneumonia of right lung   AKI (acute kidney injury) ([place])   Neutropenia ([place])   Multiple myeloma in remission ([place])    Subjective: Extubated On calling his name opens eyes  Non verbal  Medications:  . acyclovir  400 mg Per Tube BID  . aspirin  162 mg Per Tube Daily  . budesonide (PULMICORT) nebulizer solution  0.5 mg Nebulization BID  . chlorhexidine gluconate (MEDLINE KIT)  15 mL Mouth Rinse BID  . feeding supplement (PRO-STAT SUGAR FREE 64)  30 mL Per Tube TID  . gabapentin  600 mg Per Tube Q12H  . insulin aspart  0-9 Units Subcutaneous Q4H  . ipratropium-albuterol  3 mL Nebulization Q6H  . mouth rinse  15 mL Mouth Rinse 10 times per day  . midazolam PF  4 mg Intravenous Once  . pantoprazole sodium  40 mg Per Tube Daily  . sodium chloride flush  10-40 mL Intracatheter Q12H  . vecuronium  10 mg Intravenous Once    Objective: Vital signs in last 24 hours: Temp:  [97.7 F (36.5 C)-98.6 F (37 C)] 97.7 F (36.5 C) (01/03 0805) Pulse Rate:  [47-89] 75 (01/03 1100) Resp:  [10-32] 17 (01/03 1100) BP: (84-121)/(56-108) 115/73 (01/03 1100) SpO2:  [96 %-99 %] 96 % (01/03 1100) FiO2 (%):  [30 %] 30 % (01/03 0915) Weight:  [128.1 kg] 128.1 kg (01/03 0500)  PHYSICAL EXAM:  General:letahrgic, no distress, opens eyes to calling his name extubated    Lab Results Recent Labs    08/08/18 0345 08/09/18 0444 08/09/18 0818  WBC 1.5*  --  3.2*  HGB 9.5*  --  9.4*  HCT 29.8*  --  29.6*  NA 140 146*  --   K 3.3* 4.0  --   CL 110 116*  --   CO2 23 25  --   BUN 48* 58*  --   CREATININE 2.39* 2.86*  --    Liver Panel Recent Labs    08/07/18 0555  ALBUMIN 2.5*   Sedimentation Rate No results for  input(s): ESRSEDRATE in the last 72 hours. C-Reactive Protein No results for input(s): CRP in the last 72 hours.  Microbiology:  Studies/Results: Dg Chest Port 1 View  Result Date: 08/08/2018 CLINICAL DATA:  Ventilator dependent. EXAM: PORTABLE CHEST 1 VIEW COMPARISON:  08/07/2018 FINDINGS: Endotracheal tube with the tip 2.9 cm above the carina. Nasogastric tube coursing below the diaphragm. Bibasilar airspace disease likely reflecting atelectasis. Relatively low lung volumes. No pleural effusion or pneumothorax. Stable cardiomediastinal silhouette. No aggressive osseous lesion. IMPRESSION: 1. Endotracheal tube with the tip 2.9 cm above the carina. 2. Bibasilar atelectasis.  Low lung volumes. Electronically Signed   By: Kathreen Devoid   On: 08/08/2018 10:32     Assessment/Plan: Macallan Ord Perryis a 63 y.o.with a history ofmultiple myeloma status post autologous bone marrow transplantation March 2018 , now in remission and on lenalidoamideand followed at [place] presents withfever andacute shortness of breathof 1 day duration.  ?Multilobar pneumonia with acute hypoxic resp failure- extubated  He is immune compromised secondary to MM and Lenalidomide-  This is pneumococcus pneumonia as  urine antigen positive- Currently on IV ceftriaxone, IV vanco and zithomax-  DC zithromax May DC vanco tomorrow if BAL culture finalizes legionella antigen negative BAL culture neg so far- candida tropicalis in the BAL fluid is likely colonization - will not treat as clinically much improved, extubated, CXR is better and chances of candida causing pneumonia is unusual    Corona virus in resp viral PCR u-unlikely the cause for this pneumonia  Neutropenia ( resolved)and thrombocytopenia combination of infection and bone marrow suppression by Lenalidomide. Seen by heme onc and getting GCSF- improving  AKI- improved and then increasing- watch as on vanco, if worsenign cr will DC vanco  Multiple  Myeloma- s/p autologous BMT  ?Cervical vertebra compression fracture- S/p surgery with hardware  Discussed with family and pharmacist  Call ID if needed over the weekend

## 2018-08-09 NOTE — Care Management Note (Signed)
Case Management Note  Patient Details  Name: Kyle Parker MRN: [id number] Date of Birth: 1956/01/15  Subjective/Objective:   Patient admitted with Sepsis requiring intubation and ICU level care.  Patient extubated this morning and tolerating [place].  Wife Amy is at the bedside, patient is from home with wife, she reports that he has been independent in ADL's requiring no assistive devices, and he is able to drive.  Patient has multiple myeloma in remission- bone marrow transplant 2 years ago at [facility].  Patient is still followed by [facility].  PCP is Dr. Parks Ranger in [place] and pharmacy is [facility].  RNCM will cont to assess patient progress and discharge needs. Doran Clay RN BSN  [phone]                  Action/Plan:   Expected Discharge Date:                  Expected Discharge Plan:     In-House Referral:     Discharge planning Services     Post Acute Care Choice:    Choice offered to:     DME Arranged:    DME Agency:     HH Arranged:    HH Agency:     Status of Service:  In process, will continue to follow  If discussed at Long Length of Stay Meetings, dates discussed:    Additional Comments:  Shelbie Hutching, RN 08/09/2018, 2:28 PM

## 2018-08-09 NOTE — Progress Notes (Signed)
Nutrition Follow Up Note   DOCUMENTATION CODES:   Obesity unspecified  INTERVENTION:   RD will order supplements when diet advanced   Recommend daily MVI  NUTRITION DIAGNOSIS:   Inadequate oral intake related to inability to eat as evidenced by NPO status. -ongoing  GOAL:   Patient will meet greater than or equal to 90% of their needs  -not met  MONITOR:   Diet advancement, Labs, Weight trends, Skin, I & O's  ASSESSMENT:   63 year old male with PMHx of arthritis, multiple myeloma s/p autologous BMT in 10/2016 on maintenance revlimid admitted with progressive hypoxemic respiratory failure secondary to acute PNA requiring intubation on 12/31, septic shock, pancytopenia, renal failure.   Pt extubated today; tolerating well. Pt remains NPO. RD will order supplements once diet advanced. Per chart, pt is weight stable pta.   Medications reviewed and include: aspirin, insulin, protonix  Labs reviewed: Na 146(H), BUN 58(H), creat 2.86(H), P 4.3 wnl, Mg 2.4 wnl Triglycerides 258(H) Wbc- 3.2(L), Hgb 9.4(L), Hct 29.6(L)  Diet Order:   Diet Order    None     EDUCATION NEEDS:   No education needs have been identified at this time  Skin:  Skin Assessment: Reviewed RN Assessment  Last BM:  1/3- type 7  Height:   Ht Readings from Last 1 Encounters:  08/06/18 6' (1.829 m)   Weight:   Wt Readings from Last 1 Encounters:  08/09/18 128.1 kg   Ideal Body Weight:  80.9 kg  BMI:  Body mass index is 38.3 kg/m.  Estimated Nutritional Needs:   Kcal:  2400-2700kcal/day   Protein:  >128g/day   Fluid:  2-2.4L/day   Koleen Distance MS, RD, LDN Pager #- [phone] Office#- [phone] After Hours Pager: [phone]

## 2018-08-09 NOTE — Progress Notes (Signed)
FRANCO DULEY   DOB:06-05-1956   KY#:[id number]    Subjective: Patient is currently intubated/sedated.  Patient continues hypotensive/ on vasopressors-however blood pressure is improving.  No acute events overnight.  No bleeding events noted as per the nursing staff.  Fevers improving.  Objective:  Vitals:   08/09/18 0800 08/09/18 0805  BP: (!) 86/63   Pulse: (!) 53   Resp: 17   Temp:  97.7 F (36.5 C)  SpO2: 98%      Intake/Output Summary (Last 24 hours) at 08/09/2018 0838 Last data filed at 08/09/2018 3151 Gross per 24 hour  Intake 1705.29 ml  Output 1875 ml  Net -169.71 ml    Physical Exam  Constitutional: He is well-developed, well-nourished, and in no distress.  Patient is currently intubated.  Sedated.  HENT:  Head: Normocephalic and atraumatic.  Mouth/Throat: Oropharynx is clear and moist. No oropharyngeal exudate.  Eyes: Pupils are equal, round, and reactive to light.  Neck: Normal range of motion. Neck supple.  Cardiovascular: Normal rate and regular rhythm.  Pulmonary/Chest: No respiratory distress. He has no wheezes.  Decreased breath sounds at the bases.  Abdominal: Soft. Bowel sounds are normal. He exhibits no distension and no mass. There is no abdominal tenderness. There is no rebound and no guarding.  Musculoskeletal: Normal range of motion.        General: No tenderness or edema.  Neurological:  Intubated/sedated.  Skin: Skin is warm.     Labs:  Lab Results  Component Value Date   WBC 1.5 (L) 08/08/2018   HGB 9.5 (L) 08/08/2018   HCT 29.8 (L) 08/08/2018   MCV 98.7 08/08/2018   PLT 46 (L) 08/08/2018   NEUTROABS 0.7 (L) 08/08/2018    Lab Results  Component Value Date   NA 146 (H) 08/09/2018   K 4.0 08/09/2018   CL 116 (H) 08/09/2018   CO2 25 08/09/2018    Studies:  Dg Chest Port 1 View  Result Date: 08/08/2018 CLINICAL DATA:  Ventilator dependent. EXAM: PORTABLE CHEST 1 VIEW COMPARISON:  08/07/2018 FINDINGS: Endotracheal tube with the tip 2.9  cm above the carina. Nasogastric tube coursing below the diaphragm. Bibasilar airspace disease likely reflecting atelectasis. Relatively low lung volumes. No pleural effusion or pneumothorax. Stable cardiomediastinal silhouette. No aggressive osseous lesion. IMPRESSION: 1. Endotracheal tube with the tip 2.9 cm above the carina. 2. Bibasilar atelectasis.  Low lung volumes. Electronically Signed   By: Kathreen Devoid   On: 08/08/2018 10:32    Multiple myeloma without remission [facility]) #63 year old male patient with multiple myeloma on maintenance Revlimid is currently admitted to hospital for acute respiratory failure/multifocal pneumonia  #Multiple myeloma-status post autologous stem cell transplant [MRD negative]; continue to hold Revlimid given the acute respiratory failure sepsis/pancytopenia.  #Pancytopenia-severe neutropenia-likely combination of limited bone marrow reserves/Revlimid.currently on Granix x3 so far.  Platelets-46 yesterday; awaiting labs from today.  We will plan Granix if ANC less than 1.5.  Hold off platelet transfusion unless less than 20.  #Acute respiratory failure/multifocal pneumonia/septic shock- Continues to be on the ventilator.  Appreciate pulmonary critical care/ID management/recommendations.  #Acute renal failure creatinine 2.8; multifactorial prerenal-however patient making good urine output.  The above plan of care was discussed with the patient's family in detail.  Also discussed with Dr. Jefferson Fuel.    Cammie Sickle, MD 08/09/2018  8:38 AM

## 2018-08-09 NOTE — Progress Notes (Signed)
Follow up - Critical Care Medicine Note  Patient Details:    Kyle Parker is an 63 y.o. male.PMH of Multiple Myeloma s/p Bone Marrow Transplant in March 2018 (he is maintained on revlimid and acyclovir managed by [facility]), Lytic Bone Lesions, Compression Fracture of Cervical Spine at C2-C3, Neoplasm of Left Forearm (shave biopsy performed 07/10/18), Arthritis, Pneumonia (dx 04/2018 completed course of abx). He presented to [facility] ER on 12/30 with c/o fever t-max 103 degrees F, cough, chills, vomiting, and shortness of breath onset of symptoms 2 days prior to presentation.Marland Kitchen He progressed to respiratory failure initially on heated high flow then BiPAP requiring intubation  Lines, Airways, Drains: Airway 8 mm (Active)  Secured at (cm) 25 cm 08/07/2018  7:15 AM  Measured From Lips 08/07/2018  7:15 AM  [place] 08/07/2018  7:15 AM  Secured By [employer] 08/07/2018  7:15 AM  Tube Holder Repositioned Yes 08/07/2018  3:48 AM  Cuff Pressure (cm H2O) 20 cm H2O 08/07/2018  7:15 AM  Site Condition Dry 08/07/2018  3:48 AM     CVC Triple Lumen 08/06/18 Right Femoral 20 cm (Active)  Indication for Insertion or Continuance of Line Vasoactive infusions 08/06/2018  8:00 PM  Site Assessment Clean;Dry;Intact 08/06/2018  8:00 PM  Proximal Lumen Status Flushed;Saline locked 08/06/2018  8:00 PM  Medial Lumen Status Flushed;Saline locked 08/06/2018  8:00 PM  Distal Lumen Status Infusing 08/06/2018  8:00 PM  Dressing Type Transparent;Occlusive 08/06/2018  8:00 PM  Dressing Status Clean;Dry;Intact;Antimicrobial disc in place 08/06/2018  8:00 PM  Line Care Connections checked and tightened 08/06/2018  8:00 PM  Dressing Intervention New dressing 08/06/2018  8:00 PM  Dressing Change Due 08/13/18 08/06/2018  8:00 PM     NG/OG Tube Orogastric Center mouth Xray Documented cm marking at nare/ corner of mouth 58 cm (Active)  Cm Marking at Nare/Corner of Mouth (if applicable) 60 cm 11/11/9627 12:10 AM   Site Assessment Clean;Dry;Intact 08/07/2018 12:10 AM  Ongoing Placement Verification No change in respiratory status 08/07/2018 12:10 AM  Status Suction-low intermittent 08/07/2018 12:10 AM  Amount of suction 120 mmHg 08/07/2018 12:10 AM  Drainage Appearance Brown 08/07/2018 12:10 AM  Output (mL) 100 mL 08/06/2018  7:50 PM     Urethral Catheter C Green, RN Double-lumen 16 Fr. (Active)  Indication for Insertion or Continuance of Catheter Unstable critical patients (first 24-48 hours) 08/07/2018 12:10 AM  Site Assessment Clean;Intact 08/07/2018 12:10 AM  Catheter Maintenance Bag below level of bladder;Drainage bag/tubing not touching floor;No dependent loops;Seal intact;Bag emptied prior to transport 08/07/2018 12:10 AM  Collection Container Standard drainage bag 08/07/2018 12:10 AM  Securement Method Securing device (Describe) 08/07/2018 12:10 AM  Output (mL) 600 mL 08/06/2018  4:00 PM    Anti-infectives:  Anti-infectives (From admission, onward)   Start     Dose/Rate Route Frequency Ordered Stop   08/07/18 1200  cefTRIAXone (ROCEPHIN) 2 g in sodium chloride 0.9 % 100 mL IVPB     2 g 200 mL/hr over 30 Minutes Intravenous Every 24 hours 08/07/18 0920     08/07/18 0900  vancomycin (VANCOCIN) 1,250 mg in sodium chloride 0.9 % 250 mL IVPB     1,250 mg 166.7 mL/hr over 90 Minutes Intravenous Every 24 hours 08/06/18 1245     08/06/18 2300  azithromycin (ZITHROMAX) 500 mg in sodium chloride 0.9 % 250 mL IVPB  Status:  Discontinued     500 mg 250 mL/hr over 60 Minutes Intravenous Every 24 hours 08/06/18 0014  08/06/18 1241   08/06/18 2200  azithromycin (ZITHROMAX) 500 mg in sodium chloride 0.9 % 250 mL IVPB     500 mg 250 mL/hr over 60 Minutes Intravenous Every 24 hours 08/06/18 1241     08/06/18 2200  acyclovir (ZOVIRAX) 200 MG/5ML suspension SUSP 400 mg     400 mg Per Tube 2 times daily 08/06/18 1241     08/06/18 1300  acyclovir (ZOVIRAX) 200 MG capsule 400 mg  Status:  Discontinued     400 mg Oral 2 times  daily 08/06/18 1231 08/06/18 1241   08/06/18 1000  ceFEPIme (MAXIPIME) 2 g in sodium chloride 0.9 % 100 mL IVPB  Status:  Discontinued     2 g 200 mL/hr over 30 Minutes Intravenous Every 12 hours 08/06/18 0012 08/07/18 0920   08/06/18 0500  vancomycin (VANCOCIN) 1,250 mg in sodium chloride 0.9 % 250 mL IVPB  Status:  Discontinued     1,250 mg 166.7 mL/hr over 90 Minutes Intravenous Every 24 hours 08/06/18 0012 08/06/18 1245   08/06/18 0015  acyclovir (ZOVIRAX) tablet 400 mg  Status:  Discontinued     400 mg Oral 2 times daily 08/06/18 0014 08/06/18 1231   08/05/18 2215  vancomycin (VANCOCIN) IVPB 1000 mg/200 mL premix     1,000 mg 200 mL/hr over 60 Minutes Intravenous  Once 08/05/18 2202 08/05/18 2345   08/05/18 2215  ceFEPIme (MAXIPIME) 2 g in sodium chloride 0.9 % 100 mL IVPB     2 g 200 mL/hr over 30 Minutes Intravenous Once 08/05/18 2202 08/05/18 2313   08/05/18 2215  azithromycin (ZITHROMAX) 500 mg in sodium chloride 0.9 % 250 mL IVPB     500 mg 250 mL/hr over 60 Minutes Intravenous  Once 08/05/18 2203 08/05/18 2345   08/05/18 2145  cefTRIAXone (ROCEPHIN) 1 g in sodium chloride 0.9 % 100 mL IVPB  Status:  Discontinued     1 g 200 mL/hr over 30 Minutes Intravenous  Once 08/05/18 2144 08/05/18 2202   08/05/18 2145  azithromycin (ZITHROMAX) 500 mg in sodium chloride 0.9 % 250 mL IVPB  Status:  Discontinued     500 mg 250 mL/hr over 60 Minutes Intravenous  Once 08/05/18 2144 08/05/18 2202      Microbiology: Results for orders placed or performed during the hospital encounter of 08/05/18  Respiratory Panel by PCR     Status: Abnormal   Collection Time: 08/05/18  8:56 PM  Result Value Ref Range Status   Adenovirus NOT DETECTED NOT DETECTED Final   Coronavirus 229E NOT DETECTED NOT DETECTED Final   Coronavirus HKU1 DETECTED (A) NOT DETECTED Final   Coronavirus NL63 NOT DETECTED NOT DETECTED Final   Coronavirus OC43 NOT DETECTED NOT DETECTED Final   Metapneumovirus NOT DETECTED NOT  DETECTED Final   Rhinovirus / Enterovirus NOT DETECTED NOT DETECTED Final   Influenza A NOT DETECTED NOT DETECTED Final   Influenza B NOT DETECTED NOT DETECTED Final   Parainfluenza Virus 1 NOT DETECTED NOT DETECTED Final   Parainfluenza Virus 2 NOT DETECTED NOT DETECTED Final   Parainfluenza Virus 3 NOT DETECTED NOT DETECTED Final   Parainfluenza Virus 4 NOT DETECTED NOT DETECTED Final   Respiratory Syncytial Virus NOT DETECTED NOT DETECTED Final   Bordetella pertussis NOT DETECTED NOT DETECTED Final   Chlamydophila pneumoniae NOT DETECTED NOT DETECTED Final   Mycoplasma pneumoniae NOT DETECTED NOT DETECTED Final    Comment: Performed at [facility], [address], [place], [place] [address]  Culture, blood (routine x 2)     Status: None (Preliminary result)   Collection Time: 08/05/18 10:10 PM  Result Value Ref Range Status   Specimen Description BLOOD BLOOD RIGHT HAND  Final   Special Requests   Final    BOTTLES DRAWN AEROBIC AND ANAEROBIC Blood Culture adequate volume   Culture   Final    NO GROWTH 3 DAYS Performed at [facility], [address] [place], [place] [address]    Report Status PENDING  Incomplete  Culture, blood (routine x 2)     Status: None (Preliminary result)   Collection Time: 08/05/18 10:11 PM  Result Value Ref Range Status   Specimen Description BLOOD RIGHT ANTECUBITAL  Final   Special Requests   Final    BOTTLES DRAWN AEROBIC AND ANAEROBIC Blood Culture results may not be optimal due to an excessive volume of blood received in culture bottles   Culture   Final    NO GROWTH 3 DAYS Performed at [facility], [address], [place], [place] [address]    Report Status PENDING  Incomplete  MRSA PCR Screening     Status: None   Collection Time: 08/06/18 12:18 AM  Result Value Ref Range Status   MRSA by PCR NEGATIVE NEGATIVE Final    Comment:        The GeneXpert MRSA Assay (FDA approved for NASAL specimens only), is one  component of a comprehensive MRSA colonization surveillance program. It is not intended to diagnose MRSA infection nor to guide or monitor treatment for MRSA infections. Performed at [facility], [address] [place], [place] [address]   Culture, respiratory (non-expectorated)     Status: None (Preliminary result)   Collection Time: 08/06/18  3:44 PM  Result Value Ref Range Status   Specimen Description   Final    BRONCHIAL ALVEOLAR LAVAGE Performed at [facility], [address] [place], [place] [address]    Special Requests   Final    Immunocompromised Performed at [facility], [address] [place], [place] [address]    Gram Stain   Final    MODERATE WBC PRESENT, PREDOMINANTLY PMN RARE GRAM POSITIVE COCCI    Culture   Final    CULTURE REINCUBATED FOR BETTER GROWTH Performed at [facility], [place] [address] [place], [place] [address]    Report Status PENDING  Incomplete  Culture, fungus without smear     Status: Abnormal (Preliminary result)   Collection Time: 08/06/18  3:44 PM  Result Value Ref Range Status   Specimen Description   Final    BRONCHIAL ALVEOLAR LAVAGE Performed at [facility], [address], [place], [place] [address]    Special Requests   Final    Immunocompromised Performed at [facility], [address] [place], [place] [address]    Culture CANDIDA TROPICALIS (A)  Final   Report Status PENDING  Incomplete  Urine culture     Status: None   Collection Time: 08/06/18  5:13 PM  Result Value Ref Range Status   Specimen Description   Final    URINE, RANDOM Performed at [facility], [address] [place], [place] [address]    Special Requests   Final    NONE Performed at [facility], [address], [place], [place] [address]    Culture   Final    NO GROWTH Performed at [facility], [place],  [place] [address]    Report Status  08/07/2018 FINAL  Final   Studies: Dg Chest 1 View  Result Date: 08/06/2018 CLINICAL DATA:  Endotracheal tube placement. EXAM: CHEST  1 VIEW 1:52 p.m. COMPARISON:  08/06/2018 at 7:45 a.m. FINDINGS: Endotracheal tube has been inserted and the tip is 3 cm above the carina. OG tube tip is below the diaphragm. There is progressive consolidation at the right lung base with air bronchograms. This involves at least the right middle lobe. New slight atelectasis at the left lung base. Heart size and vascularity are normal. No acute bone abnormality. Known multiple myeloma. IMPRESSION: Endotracheal tube and OG tube appear in good position. Progressive consolidation at the right lung base. Electronically Signed   By: Lorriane Shire M.D.   On: 08/06/2018 14:07   Dg Chest 2 View  Result Date: 08/05/2018 CLINICAL DATA:  Dyspnea EXAM: CHEST - 2 VIEW COMPARISON:  None. FINDINGS: Normal heart size. Normal mediastinal contour. No pneumothorax. No pleural effusion. Patchy right middle and lower lobe consolidation. No pulmonary edema. IMPRESSION: Patchy right middle and right lower lobe consolidation, probably multilobar pneumonia. Recommend follow-up PA and lateral post treatment chest radiographs in 4-6 weeks. Electronically Signed   By: Ilona Sorrel M.D.   On: 08/05/2018 21:31   Dg Chest Port 1 View  Result Date: 08/08/2018 CLINICAL DATA:  Ventilator dependent. EXAM: PORTABLE CHEST 1 VIEW COMPARISON:  08/07/2018 FINDINGS: Endotracheal tube with the tip 2.9 cm above the carina. Nasogastric tube coursing below the diaphragm. Bibasilar airspace disease likely reflecting atelectasis. Relatively low lung volumes. No pleural effusion or pneumothorax. Stable cardiomediastinal silhouette. No aggressive osseous lesion. IMPRESSION: 1. Endotracheal tube with the tip 2.9 cm above the carina. 2. Bibasilar atelectasis.  Low lung volumes. Electronically Signed   By: Kathreen Devoid   On: 08/08/2018 10:32   Dg Chest Port 1  View  Result Date: 08/07/2018 CLINICAL DATA:  Acute respiratory failure. EXAM: PORTABLE CHEST 1 VIEW COMPARISON:  08/06/2018 FINDINGS: The patient is rotated to the right. Endotracheal tube terminates 2 cm above the carina. Enteric tube courses into the left upper abdomen with tip not imaged. Lung volumes remain low. There is persistent right basilar consolidation. Milder opacity remains in the left lung base. No large pleural effusion or pneumothorax is identified. IMPRESSION: Low lung volumes with persistent right basilar consolidation. Milder consolidation or atelectasis in the left lung base. Electronically Signed   By: Logan Bores M.D.   On: 08/07/2018 06:40   Dg Chest Port 1 View  Result Date: 08/06/2018 CLINICAL DATA:  Acute respiratory failure. EXAM: PORTABLE CHEST 1 VIEW COMPARISON:  08/05/2018. FINDINGS: Mild cardiomegaly with mild pulmonary venous congestion. Persistent right middle lobe dense atelectasis/infiltrate. Small right pleural effusion. No pneumothorax. Prior cervical spine fusion. IMPRESSION: 1. Persistent right middle lobe dense atelectasis/consolidation. Right middle lobe pneumonia is most likely present. Small right pleural effusion. 2. Cardiomegaly with mild pulmonary venous congestion. Electronically Signed   By: Marcello Moores  Register   On: 08/06/2018 08:05   Dg Abd Portable 1v  Result Date: 08/06/2018 CLINICAL DATA:  OG tube placement. EXAM: PORTABLE ABDOMEN - 1 VIEW COMPARISON:  Scout image for CT scan dated 03/15/2016 FINDINGS: OG tube tip is in the mid stomach. Endotracheal tube tip is 3 cm above the carina. Focal consolidation at the right lung base. No visible dilated bowel. IMPRESSION: OG tube tip in the mid stomach. Consolidation at the right lung base. Electronically Signed   By: Lorriane Shire M.D.   On:  08/06/2018 14:04   [place] Ekg Site Rite  Result Date: 08/06/2018 If Site Rite image not attached, placement could not be confirmed due to current cardiac  rhythm.   Consults: Treatment Team:  Earlie Server, MD [facility], [facility], MD   Subjective:    Overnight Issues: Patient did well overnight.  Had no issues.  Did well on a breathing trial this morning however became acutely agitated and anxious.  His lungs were clear and oxygen saturations were fine and he was doing well prior to the agitation.  Proceeded with extubation and he did well, now on 4 L nasal cannula with oxygen saturation of 95%  Objective:  Vital signs for last 24 hours: Temp:  [97.7 F (36.5 C)-98.6 F (37 C)] 97.7 F (36.5 C) (01/03 0805) Pulse Rate:  [42-89] 75 (01/03 1100) Resp:  [10-32] 17 (01/03 1100) BP: (82-121)/(56-108) 115/73 (01/03 1100) SpO2:  [94 %-99 %] 96 % (01/03 1100) FiO2 (%):  [30 %] 30 % (01/03 0915) Weight:  [128.1 kg] 128.1 kg (01/03 0500)  Hemodynamic parameters for last 24 hours:    Intake/Output from previous day: 01/02 0701 - 01/03 0700 In: 1705.3 [I.V.:1201.1; IV Piggyback:504.2] Out: 1875 [Urine:1875]  Intake/Output this shift: No intake/output data recorded.  Vent settings for last 24 hours: Vent Mode: PSV FiO2 (%):  [30 %] 30 % Set Rate:  [16 bmp] 16 bmp Vt Set:  [500 mL] 500 mL PEEP:  [5 cmH20] 5 cmH20  Physical Exam:  Patient is Resting comfortably, Neuro: Somnolent but arousable HEENT: supple, no JVD  Cardiovascular: nsr, rrr, no R/G  Lungs:  Lungs have significantly improved, clear to auscultation today Abdomen: +BS x4, soft, non tender, non distended  Musculoskeletal: normal bulk and tone, no edema  Skin: intact no rashes or lesions  Assessment/Plan:  Ventilator dependent respiratory failure.  Patient was extubated this morning and is doing well on nasal cannula with stable oxygenation and breathing  Septic shock.  Was able to wean off of pressors after stopping sedation and extubated  History of multiple myeloma, status post bone marrow transplant in March 2018.  Appreciate hematology/oncology.  Patient with  neutropenia to receive G-CSF  Pancytopenia.  Improving.  Patient's white count is up to 3.2, hemoglobin 9.4, platelet count unable to estimate secondary to clumping  Renal failure.  Stabilizing.  BUN is 58/creatinine 2.86.  Will have to follow closely hopefully will plateau most likely secondary to ATN from septic shock  Critical Care Total Time 40 minutes  Mekenna Finau 08/09/2018  *Care during the described time interval was provided by me and/or other providers on the critical care team.  I have reviewed this patient's available data, including medical history, events of note, physical examination and test results as part of my evaluation. Patient ID: MCKENNON ZWART, male   DOB: [birth year], 63 y.o.   MRN: [id number] Patient ID: FELICIANO WYNTER, male   DOB: [birth year], 63 y.o.   MRN: [id number]

## 2018-08-09 NOTE — Assessment & Plan Note (Signed)
#  63 year old male patient with multiple myeloma on maintenance Revlimid is currently admitted to hospital for acute respiratory failure/multifocal pneumonia  #Multiple myeloma-status post autologous stem cell transplant [MRD negative]; continue to hold Revlimid given the acute respiratory failure sepsis/pancytopenia.  #Pancytopenia-severe neutropenia-likely combination of limited bone marrow reserves/Revlimid.currently on Granix x3 so far.  Platelets-46 yesterday; awaiting labs from today.  We will plan Granix if ANC less than 1.5.  Hold off platelet transfusion unless less than 20.  #Acute respiratory failure/multifocal pneumonia/septic shock- Continues to be on the ventilator.  Appreciate pulmonary critical care/ID management/recommendations.  #Acute renal failure creatinine 2.8; multifactorial prerenal-however patient making good urine output.  The above plan of care was discussed with the patient's family in detail.  Also discussed with Dr. Jefferson Fuel.

## 2018-08-09 NOTE — Progress Notes (Addendum)
Pharmacy ICU Monitoring Consult:  Pharmacy consulted to assist in monitoring and replacing electrolytes, constipation, and glucose management in this 63 y.o. male admitted on 08/05/2018 with sepsis/pneumonia.   Patient intubated on 12/31 and is sedated with propofol infusion and prn fentanyl.   Labs:  Sodium (mmol/L)  Date Value  08/09/2018 146 (H)   Potassium (mmol/L)  Date Value  08/09/2018 4.0   Magnesium (mg/dL)  Date Value  08/09/2018 2.4   Phosphorus (mg/dL)  Date Value  08/09/2018 4.3   Calcium (mg/dL)  Date Value  08/09/2018 8.4 (L)   Albumin (g/dL)  Date Value  08/07/2018 2.5 (L)  02/07/2016 4.1    Assessment/Plan: 1. Electrolytes: 01/03:No replacement needed at this time.  2. Constipation:  Multiple BMs reported on  01/02. Will adjust order to Senna/Docusate PRN. Will continue to follow and add constipation prophylaxis as needed.   3. Glucose: No Hx of DM reported. BGs: 81-107. No changes at this time.   Pharmacy will continue to monitor and adjust per consult.   Pernell Dupre, PharmD, BCPS Clinical Pharmacist 08/09/2018 7:30 AM

## 2018-08-09 NOTE — Progress Notes (Signed)
Cuff leak noted and patient extubated and placed on 4lpm [place].  Patient tolerated well.  HR 76 RR 28 oxygen saturations 94%

## 2018-08-10 LAB — GLUCOSE, CAPILLARY
GLUCOSE-CAPILLARY: 100 mg/dL — AB (ref 70–99)
Glucose-Capillary: 99 mg/dL (ref 70–99)
Glucose-Capillary: 93 mg/dL (ref 70–99)
Glucose-Capillary: 113 mg/dL — ABNORMAL HIGH (ref 70–99)
Glucose-Capillary: 145 mg/dL — ABNORMAL HIGH (ref 70–99)

## 2018-08-10 LAB — BASIC METABOLIC PANEL
Anion gap: 6 (ref 5–15)
BUN: 68 mg/dL — AB (ref 8–23)
CO2: 23 mmol/L (ref 22–32)
Calcium: 8.4 mg/dL — ABNORMAL LOW (ref 8.9–10.3)
Chloride: 121 mmol/L — ABNORMAL HIGH (ref 98–111)
Creatinine, Ser: 2.61 mg/dL — ABNORMAL HIGH (ref 0.61–1.24)
GFR calc Af Amer: 29 mL/min — ABNORMAL LOW (ref >60)
GFR calc non Af Amer: 25 mL/min — ABNORMAL LOW (ref >60)
Glucose, Bld: 109 mg/dL — ABNORMAL HIGH (ref 70–99)
Potassium: 3.9 mmol/L (ref 3.5–5.1)
Sodium: 150 mmol/L — ABNORMAL HIGH (ref 135–145)

## 2018-08-10 LAB — CULTURE, RESPIRATORY

## 2018-08-10 LAB — CBC WITH DIFFERENTIAL/PLATELET
Abs Immature Granulocytes: 0.06 10*3/uL (ref 0.00–0.07)
Basophils Absolute: 0.1 10*3/uL (ref 0.0–0.1)
Basophils Relative: 1 %
Eosinophils Absolute: 0.1 10*3/uL (ref 0.0–0.5)
Eosinophils Relative: 1 %
HCT: 32.1 % — ABNORMAL LOW (ref 39.0–52.0)
HEMOGLOBIN: 10.2 g/dL — AB (ref 13.0–17.0)
Immature Granulocytes: 1 %
Lymphocytes Relative: 12 %
Lymphs Abs: 0.6 10*3/uL — ABNORMAL LOW (ref 0.7–4.0)
MCH: 31.4 pg (ref 26.0–34.0)
MCHC: 31.8 g/dL (ref 30.0–36.0)
MCV: 98.8 fL (ref 80.0–100.0)
Monocytes Absolute: 1 10*3/uL (ref 0.1–1.0)
Monocytes Relative: 20 %
Neutro Abs: 3.2 10*3/uL (ref 1.7–7.7)
Neutrophils Relative %: 65 %
PLATELETS: UNDETERMINED 10*3/uL (ref 150–400)
RBC: 3.25 MIL/uL — ABNORMAL LOW (ref 4.22–5.81)
RDW: 15.3 % (ref 11.5–15.5)
SMEAR REVIEW: UNDETERMINED
WBC: 4.9 10*3/uL (ref 4.0–10.5)
nRBC: 0 % (ref 0.0–0.2)

## 2018-08-10 LAB — CULTURE, BLOOD (ROUTINE X 2)
CULTURE: NO GROWTH
Culture: NO GROWTH
Special Requests: ADEQUATE

## 2018-08-10 LAB — CULTURE, RESPIRATORY W GRAM STAIN

## 2018-08-10 LAB — FUNGAL ANTIBODIES PANEL, ID-BLOOD
Aspergillus flavus: NEGATIVE
Aspergillus fumigatus, IgG: NEGATIVE
Aspergillus niger: NEGATIVE
Blastomyces Abs, Qn, DID: NEGATIVE
Histoplasma Ab, Immunodiffusion: NEGATIVE

## 2018-08-10 MED ORDER — PANTOPRAZOLE SODIUM 40 MG PO TBEC
40.0000 mg | DELAYED_RELEASE_TABLET | Freq: Every day | ORAL | Status: DC
  Administered 2018-08-11 – 2018-08-12 (×2): 40 mg via ORAL
  Filled 2018-08-10 (×2): qty 1

## 2018-08-10 MED ORDER — DEXTROSE 5 % IV SOLN
INTRAVENOUS | Status: DC
  Administered 2018-08-10 – 2018-08-11 (×2): via INTRAVENOUS

## 2018-08-10 MED ORDER — GABAPENTIN 300 MG PO CAPS
600.0000 mg | ORAL_CAPSULE | Freq: Two times a day (BID) | ORAL | Status: DC
  Administered 2018-08-10 – 2018-08-12 (×4): 600 mg via ORAL
  Filled 2018-08-10 (×4): qty 2

## 2018-08-10 MED ORDER — INSULIN ASPART 100 UNIT/ML ~~LOC~~ SOLN: 0.0000 [IU] | Freq: Every day | SUBCUTANEOUS | Status: DC

## 2018-08-10 MED ORDER — INSULIN ASPART 100 UNIT/ML ~~LOC~~ SOLN
0.0000 [IU] | Freq: Three times a day (TID) | SUBCUTANEOUS | Status: DC
  Administered 2018-08-10: 1 [IU] via SUBCUTANEOUS
  Filled 2018-08-10: qty 1

## 2018-08-10 MED ORDER — ACYCLOVIR 200 MG PO CAPS
400.0000 mg | ORAL_CAPSULE | Freq: Two times a day (BID) | ORAL | Status: DC
  Administered 2018-08-10 – 2018-08-12 (×4): 400 mg via ORAL
  Filled 2018-08-10 (×5): qty 2

## 2018-08-10 NOTE — Progress Notes (Signed)
Follow up - Critical Care Medicine Note  Patient Details:    Kyle Parker is an 63 y.o. male.PMH of Multiple Myeloma s/p Bone Marrow Transplant in March 2018 (he is maintained on revlimid and acyclovir managed by [facility]), Lytic Bone Lesions, Compression Fracture of Cervical Spine at C2-C3, Neoplasm of Left Forearm (shave biopsy performed 07/10/18), Arthritis, Pneumonia (dx 04/2018 completed course of abx). He presented to [facility] ER on 12/30 with c/o fever t-max 103 degrees F, cough, chills, vomiting, and shortness of breath onset of symptoms 2 days prior to presentation.Marland Kitchen He progressed to respiratory failure initially on heated high flow then BiPAP requiring intubation  Lines, Airways, Drains: Airway 8 mm (Active)  Secured at (cm) 25 cm 08/07/2018  7:15 AM  Measured From Lips 08/07/2018  7:15 AM  [place] 08/07/2018  7:15 AM  Secured By [employer] 08/07/2018  7:15 AM  Tube Holder Repositioned Yes 08/07/2018  3:48 AM  Cuff Pressure (cm H2O) 20 cm H2O 08/07/2018  7:15 AM  Site Condition Dry 08/07/2018  3:48 AM     CVC Triple Lumen 08/06/18 Right Femoral 20 cm (Active)  Indication for Insertion or Continuance of Line Vasoactive infusions 08/06/2018  8:00 PM  Site Assessment Clean;Dry;Intact 08/06/2018  8:00 PM  Proximal Lumen Status Flushed;Saline locked 08/06/2018  8:00 PM  Medial Lumen Status Flushed;Saline locked 08/06/2018  8:00 PM  Distal Lumen Status Infusing 08/06/2018  8:00 PM  Dressing Type Transparent;Occlusive 08/06/2018  8:00 PM  Dressing Status Clean;Dry;Intact;Antimicrobial disc in place 08/06/2018  8:00 PM  Line Care Connections checked and tightened 08/06/2018  8:00 PM  Dressing Intervention New dressing 08/06/2018  8:00 PM  Dressing Change Due 08/13/18 08/06/2018  8:00 PM     NG/OG Tube Orogastric Center mouth Xray Documented cm marking at nare/ corner of mouth 58 cm (Active)  Cm Marking at Nare/Corner of Mouth (if applicable) 60 cm 03/11/6313 12:10 AM   Site Assessment Clean;Dry;Intact 08/07/2018 12:10 AM  Ongoing Placement Verification No change in respiratory status 08/07/2018 12:10 AM  Status Suction-low intermittent 08/07/2018 12:10 AM  Amount of suction 120 mmHg 08/07/2018 12:10 AM  Drainage Appearance Brown 08/07/2018 12:10 AM  Output (mL) 100 mL 08/06/2018  7:50 PM     Urethral Catheter C Green, RN Double-lumen 16 Fr. (Active)  Indication for Insertion or Continuance of Catheter Unstable critical patients (first 24-48 hours) 08/07/2018 12:10 AM  Site Assessment Clean;Intact 08/07/2018 12:10 AM  Catheter Maintenance Bag below level of bladder;Drainage bag/tubing not touching floor;No dependent loops;Seal intact;Bag emptied prior to transport 08/07/2018 12:10 AM  Collection Container Standard drainage bag 08/07/2018 12:10 AM  Securement Method Securing device (Describe) 08/07/2018 12:10 AM  Output (mL) 600 mL 08/06/2018  4:00 PM    Anti-infectives:  Anti-infectives (From admission, onward)   Start     Dose/Rate Route Frequency Ordered Stop   08/10/18 1000  vancomycin (VANCOCIN) IVPB 1000 mg/200 mL premix     1,000 mg 200 mL/hr over 60 Minutes Intravenous Every 24 hours 08/09/18 1127     08/07/18 1200  cefTRIAXone (ROCEPHIN) 2 g in sodium chloride 0.9 % 100 mL IVPB     2 g 200 mL/hr over 30 Minutes Intravenous Every 24 hours 08/07/18 0920     08/07/18 0900  vancomycin (VANCOCIN) 1,250 mg in sodium chloride 0.9 % 250 mL IVPB  Status:  Discontinued     1,250 mg 166.7 mL/hr over 90 Minutes Intravenous Every 24 hours 08/06/18 1245 08/09/18 1127   08/06/18  2300  azithromycin (ZITHROMAX) 500 mg in sodium chloride 0.9 % 250 mL IVPB  Status:  Discontinued     500 mg 250 mL/hr over 60 Minutes Intravenous Every 24 hours 08/06/18 0014 08/06/18 1241   08/06/18 2200  azithromycin (ZITHROMAX) 500 mg in sodium chloride 0.9 % 250 mL IVPB  Status:  Discontinued     500 mg 250 mL/hr over 60 Minutes Intravenous Every 24 hours 08/06/18 1241 08/09/18 1126   08/06/18  2200  acyclovir (ZOVIRAX) 200 MG/5ML suspension SUSP 400 mg     400 mg Per Tube 2 times daily 08/06/18 1241     08/06/18 1300  acyclovir (ZOVIRAX) 200 MG capsule 400 mg  Status:  Discontinued     400 mg Oral 2 times daily 08/06/18 1231 08/06/18 1241   08/06/18 1000  ceFEPIme (MAXIPIME) 2 g in sodium chloride 0.9 % 100 mL IVPB  Status:  Discontinued     2 g 200 mL/hr over 30 Minutes Intravenous Every 12 hours 08/06/18 0012 08/07/18 0920   08/06/18 0500  vancomycin (VANCOCIN) 1,250 mg in sodium chloride 0.9 % 250 mL IVPB  Status:  Discontinued     1,250 mg 166.7 mL/hr over 90 Minutes Intravenous Every 24 hours 08/06/18 0012 08/06/18 1245   08/06/18 0015  acyclovir (ZOVIRAX) tablet 400 mg  Status:  Discontinued     400 mg Oral 2 times daily 08/06/18 0014 08/06/18 1231   08/05/18 2215  vancomycin (VANCOCIN) IVPB 1000 mg/200 mL premix     1,000 mg 200 mL/hr over 60 Minutes Intravenous  Once 08/05/18 2202 08/05/18 2345   08/05/18 2215  ceFEPIme (MAXIPIME) 2 g in sodium chloride 0.9 % 100 mL IVPB     2 g 200 mL/hr over 30 Minutes Intravenous Once 08/05/18 2202 08/05/18 2313   08/05/18 2215  azithromycin (ZITHROMAX) 500 mg in sodium chloride 0.9 % 250 mL IVPB     500 mg 250 mL/hr over 60 Minutes Intravenous  Once 08/05/18 2203 08/05/18 2345   08/05/18 2145  cefTRIAXone (ROCEPHIN) 1 g in sodium chloride 0.9 % 100 mL IVPB  Status:  Discontinued     1 g 200 mL/hr over 30 Minutes Intravenous  Once 08/05/18 2144 08/05/18 2202   08/05/18 2145  azithromycin (ZITHROMAX) 500 mg in sodium chloride 0.9 % 250 mL IVPB  Status:  Discontinued     500 mg 250 mL/hr over 60 Minutes Intravenous  Once 08/05/18 2144 08/05/18 2202      Microbiology: Results for orders placed or performed during the hospital encounter of 08/05/18  Respiratory Panel by PCR     Status: Abnormal   Collection Time: 08/05/18  8:56 PM  Result Value Ref Range Status   Adenovirus NOT DETECTED NOT DETECTED Final   Coronavirus 229E NOT  DETECTED NOT DETECTED Final   Coronavirus HKU1 DETECTED (A) NOT DETECTED Final   Coronavirus NL63 NOT DETECTED NOT DETECTED Final   Coronavirus OC43 NOT DETECTED NOT DETECTED Final   Metapneumovirus NOT DETECTED NOT DETECTED Final   Rhinovirus / Enterovirus NOT DETECTED NOT DETECTED Final   Influenza A NOT DETECTED NOT DETECTED Final   Influenza B NOT DETECTED NOT DETECTED Final   Parainfluenza Virus 1 NOT DETECTED NOT DETECTED Final   Parainfluenza Virus 2 NOT DETECTED NOT DETECTED Final   Parainfluenza Virus 3 NOT DETECTED NOT DETECTED Final   Parainfluenza Virus 4 NOT DETECTED NOT DETECTED Final   Respiratory Syncytial Virus NOT DETECTED NOT DETECTED Final   Bordetella pertussis NOT  DETECTED NOT DETECTED Final   Chlamydophila pneumoniae NOT DETECTED NOT DETECTED Final   Mycoplasma pneumoniae NOT DETECTED NOT DETECTED Final    Comment: Performed at [facility], [place] [address] [place], [place] [address]  Culture, blood (routine x 2)     Status: None   Collection Time: 08/05/18 10:10 PM  Result Value Ref Range Status   Specimen Description BLOOD BLOOD RIGHT HAND  Final   Special Requests   Final    BOTTLES DRAWN AEROBIC AND ANAEROBIC Blood Culture adequate volume   Culture   Final    NO GROWTH 5 DAYS Performed at [facility], [address] [place], [place] [address]    Report Status 08/10/2018 FINAL  Final  Culture, blood (routine x 2)     Status: None   Collection Time: 08/05/18 10:11 PM  Result Value Ref Range Status   Specimen Description BLOOD RIGHT ANTECUBITAL  Final   Special Requests   Final    BOTTLES DRAWN AEROBIC AND ANAEROBIC Blood Culture results may not be optimal due to an excessive volume of blood received in culture bottles   Culture   Final    NO GROWTH 5 DAYS Performed at [facility], [address] [place], [place] [address]    Report Status 08/10/2018 FINAL  Final  MRSA PCR Screening     Status: None   Collection Time:  08/06/18 12:18 AM  Result Value Ref Range Status   MRSA by PCR NEGATIVE NEGATIVE Final    Comment:        The GeneXpert MRSA Assay (FDA approved for NASAL specimens only), is one component of a comprehensive MRSA colonization surveillance program. It is not intended to diagnose MRSA infection nor to guide or monitor treatment for MRSA infections. Performed at [facility], [address] [place], [place] [address]   Culture, respiratory (non-expectorated)     Status: None   Collection Time: 08/06/18  3:44 PM  Result Value Ref Range Status   Specimen Description   Final    BRONCHIAL ALVEOLAR LAVAGE Performed at [facility], [address], [place], [place] [address]    Special Requests   Final    Immunocompromised Performed at [facility], [address] [place], [place] [address]    Gram Stain   Final    MODERATE WBC PRESENT, PREDOMINANTLY PMN RARE GRAM POSITIVE COCCI    Culture   Final    FEW STREPTOCOCCUS VESTIBULARIS FEW LACTOBACILLUS SPECIES NO STREPTOCOCCUS PNEUMONIAE ISOLATED PER PHYSICIAN REQUEST Performed at [facility], [place] [address] [place], [place] [address]    Report Status 08/10/2018 FINAL  Final  Culture, fungus without smear     Status: Abnormal (Preliminary result)   Collection Time: 08/06/18  3:44 PM  Result Value Ref Range Status   Specimen Description   Final    BRONCHIAL ALVEOLAR LAVAGE Performed at [facility], [address] [place], [place] [address]    Special Requests   Final    Immunocompromised Performed at [facility], [address] [place], [place] [address]    Culture CANDIDA TROPICALIS (A)  Final   Report Status PENDING  Incomplete  Urine culture     Status: None   Collection Time: 08/06/18  5:13 PM  Result Value Ref Range Status   Specimen Description   Final    URINE, RANDOM Performed at [facility], [address] [place],  [address]  Special Requests   Final    NONE Performed at [facility], [address], [place], [place] [address]    Culture   Final    NO GROWTH Performed at [facility], [place] [address] [place], [place] [address]    Report Status 08/07/2018 FINAL  Final   Studies: Dg Chest 1 View  Result Date: 08/06/2018 CLINICAL DATA:  Endotracheal tube placement. EXAM: CHEST  1 VIEW 1:52 p.m. COMPARISON:  08/06/2018 at 7:45 a.m. FINDINGS: Endotracheal tube has been inserted and the tip is 3 cm above the carina. OG tube tip is below the diaphragm. There is progressive consolidation at the right lung base with air bronchograms. This involves at least the right middle lobe. New slight atelectasis at the left lung base. Heart size and vascularity are normal. No acute bone abnormality. Known multiple myeloma. IMPRESSION: Endotracheal tube and OG tube appear in good position. Progressive consolidation at the right lung base. Electronically Signed   By: Lorriane Shire M.D.   On: 08/06/2018 14:07   Dg Chest 2 View  Result Date: 08/05/2018 CLINICAL DATA:  Dyspnea EXAM: CHEST - 2 VIEW COMPARISON:  None. FINDINGS: Normal heart size. Normal mediastinal contour. No pneumothorax. No pleural effusion. Patchy right middle and lower lobe consolidation. No pulmonary edema. IMPRESSION: Patchy right middle and right lower lobe consolidation, probably multilobar pneumonia. Recommend follow-up PA and lateral post treatment chest radiographs in 4-6 weeks. Electronically Signed   By: Ilona Sorrel M.D.   On: 08/05/2018 21:31   Dg Chest Port 1 View  Result Date: 08/08/2018 CLINICAL DATA:  Ventilator dependent. EXAM: PORTABLE CHEST 1 VIEW COMPARISON:  08/07/2018 FINDINGS: Endotracheal tube with the tip 2.9 cm above the carina. Nasogastric tube coursing below the diaphragm. Bibasilar airspace disease likely reflecting atelectasis. Relatively low lung volumes. No pleural effusion or pneumothorax. Stable cardiomediastinal  silhouette. No aggressive osseous lesion. IMPRESSION: 1. Endotracheal tube with the tip 2.9 cm above the carina. 2. Bibasilar atelectasis.  Low lung volumes. Electronically Signed   By: Kathreen Devoid   On: 08/08/2018 10:32   Dg Chest Port 1 View  Result Date: 08/07/2018 CLINICAL DATA:  Acute respiratory failure. EXAM: PORTABLE CHEST 1 VIEW COMPARISON:  08/06/2018 FINDINGS: The patient is rotated to the right. Endotracheal tube terminates 2 cm above the carina. Enteric tube courses into the left upper abdomen with tip not imaged. Lung volumes remain low. There is persistent right basilar consolidation. Milder opacity remains in the left lung base. No large pleural effusion or pneumothorax is identified. IMPRESSION: Low lung volumes with persistent right basilar consolidation. Milder consolidation or atelectasis in the left lung base. Electronically Signed   By: Logan Bores M.D.   On: 08/07/2018 06:40   Dg Chest Port 1 View  Result Date: 08/06/2018 CLINICAL DATA:  Acute respiratory failure. EXAM: PORTABLE CHEST 1 VIEW COMPARISON:  08/05/2018. FINDINGS: Mild cardiomegaly with mild pulmonary venous congestion. Persistent right middle lobe dense atelectasis/infiltrate. Small right pleural effusion. No pneumothorax. Prior cervical spine fusion. IMPRESSION: 1. Persistent right middle lobe dense atelectasis/consolidation. Right middle lobe pneumonia is most likely present. Small right pleural effusion. 2. Cardiomegaly with mild pulmonary venous congestion. Electronically Signed   By: Marcello Moores  Register   On: 08/06/2018 08:05   Dg Abd Portable 1v  Result Date: 08/06/2018 CLINICAL DATA:  OG tube placement. EXAM: PORTABLE ABDOMEN - 1 VIEW COMPARISON:  Scout image for CT scan dated 03/15/2016 FINDINGS: OG tube tip is in the mid stomach. Endotracheal tube tip  is 3 cm above the carina. Focal consolidation at the right lung base. No visible dilated bowel. IMPRESSION: OG tube tip in the mid stomach. Consolidation at the  right lung base. Electronically Signed   By: Lorriane Shire M.D.   On: 08/06/2018 14:04   [place] Ekg Site Rite  Result Date: 08/06/2018 If Site Rite image not attached, placement could not be confirmed due to current cardiac rhythm.   Consults: Treatment Team:  Earlie Server, MD [facility], [facility], MD   Subjective:    Overnight Issues: Patient did well overnight.  Successfully extubated with no difficulties.  Awake and alert and communicating, on nasal cannula  Objective:  Vital signs for last 24 hours: Temp:  [97.9 F (36.6 C)-98.3 F (36.8 C)] 97.9 F (36.6 C) (01/04 0400) Pulse Rate:  [66-89] 71 (01/04 0800) Resp:  [16-32] 31 (01/04 0800) BP: (115-134)/(68-108) 128/75 (01/04 0800) SpO2:  [93 %-99 %] 94 % (01/04 0822)  Hemodynamic parameters for last 24 hours:    Intake/Output from previous day: 01/03 0701 - 01/04 0700 In: 447.5 [I.V.:97.5; IV Piggyback:350] Out: 2550 [Urine:2550]  Intake/Output this shift: No intake/output data recorded.  Vent settings for last 24 hours:    Physical Exam:  Patient is Resting comfortably, Neuro: Awake alert in no acute distress HEENT: supple, no JVD  Cardiovascular: nsr, rrr, no R/G  Lungs:  Lungs have significantly improved, clear to auscultation today Abdomen: +BS x4, soft, non tender, non distended  Musculoskeletal: normal bulk and tone, no edema  Skin: intact no rashes or lesions  Assessment/Plan:  Ventilator dependent respiratory failure.  Patient tolerated being extubated.  On nasal cannula with stable hemodynamics   septic shock.  Resolved  History of multiple myeloma, status post bone marrow transplant in March 2018.  Appreciate hematology/oncology.  Patient with neutropenia to receive G-CSF  Pancytopenia.  Improving.  Patient's white count is up to 4.9, hemoglobin 10.2, platelet count unable to estimate secondary to clumping  Renal failure.  Stabilizing.  BUN is 68/creatinine 2.61.  Secondary to ATN from sepsis and  hypotension.  Appears to be plateauing.  No acute indication for hemodialysis  Hypernatremia.  Will replace with some D5W  This point patient is doing quite well, stable for floor transfer  Vinicio Lynk 08/10/2018  *Care during the described time interval was provided by me and/or other providers on the critical care team.  I have reviewed this patient's available data, including medical history, events of note, physical examination and test results as part of my evaluation. Patient ID: IDRISSA BEVILLE, male   DOB: 05/07/1956, 63 y.o.   MRN: [id number] Patient ID: KHADEN GATER, male   DOB: [birth year], 63 y.o.   MRN: [id number] Patient ID: SALIH WILLIAMSON, male   DOB: 05-01-1956, 63 y.o.   MRN: [id number]

## 2018-08-10 NOTE — Evaluation (Signed)
Physical Therapy Evaluation Patient Details Name: DANISH RUFFINS MRN: [id number] DOB: [birth year] Today's Date: 08/10/2018   History of Present Illness  Penn Grissett is a 63yo male who comes to [facility] on 12/30 c SOB, 2D malaise, foudn to have multifocal PNA. Pt underwent intubation 12/31-1/3. PMH: multiple myeloma c bony mets (15lb lifting restriction), C2/3 compression fractures. PTA pt was a fully independent community dweeling adult.   Clinical Impression  Pt admitted with above diagnosis. Pt currently with functional limitations due to the deficits listed below (see "PT Problem List"). Upon entry, pt in bed, wife present who provides background while patient finishes a breathing treatment. The pt is awake, but drowsy with delayed processing time, delayed response, and flat affect, but motivated and agreeable to participate. The pt is alert and oriented x3. Examination reveals what may be best described as mild global motor apraxia more than true weakness. This is evident in bimanual tasks such as bringing a cup to his face and in the sluggishness of water moving up through his straw. Repetition in A/ROM of 4 limbs (RLE partially restricted 2/2 femoral line) does seem to improve motor planning and patient is able to produce substantial force at times. Of note, he has a 15lb lifting restriction 2/2 history of bony mets and prior spine fractures. He is visibly fatigued with minimal activity in bed with adequate rest breaks and would likely struggle to fully come to standing. Functional mobility assessment demonstrates increased effort/time requirements, poor tolerance, and need for physical assistance, whereas the patient performed these at a higher level of independence PTA. Pt will benefit from skilled PT intervention to increase independence and safety with basic mobility in preparation for discharge to the venue listed below.       Follow Up Recommendations SNF;Supervision for mobility/OOB    Equipment  Recommendations  Rolling walker with 5" wheels    Recommendations for Other Services       Precautions / Restrictions Precautions Precautions: Fall Precaution Comments: Rt femoral Line Restrictions Weight Bearing Restrictions: No      Mobility  Bed Mobility Overal bed mobility: (not assessed 2/2 femoral line placement)                Transfers                    Ambulation/Gait                Stairs            Wheelchair Mobility    Modified Rankin (Stroke Patients Only)       Balance                                             Pertinent Vitals/Pain Pain Assessment: No/denies pain    Home Living Family/patient expects to be discharged to:: Private residence Living Arrangements: Spouse/significant other Available Help at Discharge: Family Type of Home: House Home Access: Stairs to enter Entrance Stairs-Rails: Psychiatric nurse of Steps: 5-6 easiest at front with two railings; deck most frequent way c total of 14 (c landing)  Home Layout: One level;Full bath on main level;Able to live on main level with bedroom/bathroom;Laundry or work area in [place]: Environmental consultant - standard(wife unsure )      Prior Function Level of Independence: Independent with assistive device(s)  Comments: 15lb lifting restriction d/t bone lesions; has been getting bone treatments for BMD  x18 months. Pt is done with CA treatments. (no falls history, no balance issues, but has post chemo neuropathy. )     Hand Dominance   Dominant Hand: Right    Extremity/Trunk Assessment   Upper Extremity Assessment Upper Extremity Assessment: Generalized weakness(globally 4+/5, but with slow activation and bradykinesia. )    Lower Extremity Assessment Lower Extremity Assessment: Generalized weakness(Grossly 4+/5; RLE exam limited d.t femoral line)       Communication   Communication: No difficulties   Cognition Arousal/Alertness: Lethargic;Suspect due to medications Behavior During Therapy: [facility] for tasks assessed/performed Overall Cognitive Status: Within Functional Limits for tasks assessed                                        General Comments      Exercises General Exercises - Lower Extremity Ankle Circles/Pumps: AROM;Both;15 reps;Supine Short Arc Quad: AROM;Both;10 reps;Supine Heel Slides: Supine;AAROM;Left;10 reps Hip ABduction/ADduction: AROM;Supine;Both;10 reps Mini-Sqauts: Left;Supine;Strengthening;10 reps   Assessment/Plan    PT Assessment Patient needs continued PT services  PT Problem List Decreased strength;Decreased activity tolerance;Decreased mobility       PT Treatment Interventions DME instruction;Therapeutic exercise;Gait training;Balance training;Stair training;Functional mobility training;Therapeutic activities;Patient/family education    PT Goals (Current goals can be found in the Care Plan section)  Acute Rehab PT Goals Patient Stated Goal: return to baseline strength and mobility PT Goal Formulation: With patient Time For Goal Achievement: 08/24/18 Potential to Achieve Goals: Good    Frequency Min 2X/week   Barriers to discharge Inaccessible home environment 5-6 steps to enter     Co-evaluation               AM-PAC PT "6 Clicks" Mobility  Outcome Measure Help needed turning from your back to your side while in a flat bed without using bedrails?: A Lot Help needed moving from lying on your back to sitting on the side of a flat bed without using bedrails?: A Lot Help needed moving to and from a bed to a chair (including a wheelchair)?: A Lot Help needed standing up from a chair using your arms (e.g., wheelchair or bedside chair)?: A Lot Help needed to walk in hospital room?: Total Help needed climbing 3-5 steps with a railing? : Total 6 Click Score: 10    End of Session Equipment Utilized During Treatment:  Oxygen Activity Tolerance: Patient tolerated treatment well;Patient limited by lethargy Patient left: in bed;with call bell/phone within reach;with family/visitor present Nurse Communication: Mobility status(fem line placement) PT Visit Diagnosis: Muscle weakness (generalized) (M62.81);Other abnormalities of gait and mobility (R26.89)    Time: 8811-0315 PT Time Calculation (min) (ACUTE ONLY): 42 min   Charges:   PT Evaluation $PT Eval High Complexity: 1 High PT Treatments $Therapeutic Activity: 8-22 mins        4:18 PM, 08/10/18 Etta Grandchild, PT, DPT Physical Therapist - [facility]  [phone] ([place])    Ziva Nunziata C 08/10/2018, 4:12 PM

## 2018-08-10 NOTE — Progress Notes (Signed)
Patient to transfer to 1C. Reporting no pain, sleepy at times. Foley catheter removed up transfer to 1C, RN notified. Report given to [place] RN 1C. Wife at bedside. Transferring to assigned room at this time.

## 2018-08-10 NOTE — Progress Notes (Signed)
Pharmacy ICU Monitoring Consult:  Pharmacy consulted to assist in monitoring and replacing electrolytes, constipation, and glucose management in this 63 y.o. male admitted on 08/05/2018 with sepsis/pneumonia.   Labs:  Sodium (mmol/L)  Date Value  08/10/2018 150 (H)   Potassium (mmol/L)  Date Value  08/10/2018 3.9   Magnesium (mg/dL)  Date Value  08/09/2018 2.4   Phosphorus (mg/dL)  Date Value  08/09/2018 4.3   Calcium (mg/dL)  Date Value  08/10/2018 8.4 (L)   Albumin (g/dL)  Date Value  08/07/2018 2.5 (L)  02/07/2016 4.1    Assessment/Plan: 1. Electrolytes: Sodium elevate. D5 at 61mL/hr initiated by MD. Will obtain follow up BMP with am labs.   2. Constipation:  Last bowel movement 1/4. Will continue Senna/Docusate PRN.   3. Glucose: No Hx of DM reported. BGs: 81-107. No changes at this time.   Pharmacy will continue to monitor and adjust per consult.   Sherine Cortese L, 08/10/2018 10:03 AM

## 2018-08-10 NOTE — Progress Notes (Signed)
[facility] [place] NAME: Kyle Parker    MR#:  [id number]  DATE OF BIRTH:  1955-12-18  SUBJECTIVE:  Patient transferred out of ICU this morning.  Family is at bedside.  Patient sleeping this morning. He has been extubated and is currently on nasal cannula.  REVIEW OF SYSTEMS:    Unable to obtain patient.  Patient currently sleeping.  No complaints from family at bedside.     DRUG ALLERGIES:  No Known Allergies  VITALS:  Blood pressure 115/86, pulse 68, temperature 97.8 F (36.6 C), temperature source Oral, resp. rate 18, height 6' (1.829 m), weight 128.1 kg, SpO2 96 %.  PHYSICAL EXAMINATION:   Physical Exam  Constitutional: He appears well-developed and well-nourished.  HENT:  Head: Normocephalic and atraumatic.  Eyes: Pupils are equal, round, and reactive to light. Conjunctivae are normal.  Neck: Normal range of motion.  Cardiovascular: Normal rate and regular rhythm.  Respiratory: Effort normal and breath sounds normal. No stridor.  GI: Soft. Bowel sounds are normal. He exhibits no distension. There is no abdominal tenderness.  Musculoskeletal: Normal range of motion.  Neurological:  Resting comfortably at this time.  Skin: Skin is warm and dry.       LABORATORY PANEL:   CBC Recent Labs  Lab 08/10/18 0419  WBC 4.9  HGB 10.2*  HCT 32.1*  PLT PLATELET CLUMPS NOTED ON SMEAR, UNABLE TO ESTIMATE   ------------------------------------------------------------------------------------------------------------------  Chemistries  Recent Labs  Lab 08/05/18 2056  08/09/18 0444 08/10/18 0419  NA 135   < > 146* 150*  K 4.0   < > 4.0 3.9  CL 103   < > 116* 121*  CO2 20*   < > 25 23  GLUCOSE 140*   < > 107* 109*  BUN 36*   < > 58* 68*  CREATININE 3.99*   < > 2.86* 2.61*  CALCIUM 8.4*   < > 8.4* 8.4*  MG  --    < > 2.4  --   AST 20  --   --   --   ALT 19  --   --   --   ALKPHOS 28*  --   --   --   BILITOT 1.6*  --    --   --    < > = values in this interval not displayed.   ------------------------------------------------------------------------------------------------------------------  Cardiac Enzymes Recent Labs  Lab 08/05/18 2056  TROPONINI <0.03   ------------------------------------------------------------------------------------------------------------------  RADIOLOGY:  No results found.   ASSESSMENT AND PLAN:   63 year old male with history of multiple myeloma status post bone marrow transplant March 2018 who presents to the emergency room due to fever and cough.  1.  Septic shock with acute hypoxic respiratory failure due to multilobar pneumonia and neutropenia: Patient was intubated on 08/06/2018 due to progressive respiratory failure secondary to acute pneumonia and extubated 1/3. Wean off Renovo as tolerated Septic shock has resolved Stop vancomycin.  Rocephin for 1 more day for the treatment of pneumonia.   2.  Septic shock in the setting of multilobar pneumonia: Resolved Plan as outlined above   3 BPH: Continue Flomax  4.  Acute Kidney injury in the setting of septic shock:  Renal function has shown improvement Continue to monitor   5.  Multiple myeloma chronic thrombocytopenia patient status post bone marrow transplant in March 2018.   Oncology following consult appreciated. 6.  Pancytopenia Neutropenia resolved.  7.  Hypernatremia: Continue D5 and repeat  BMP in a.m.   DVT prophylaxis; no heparin products due to severe thrombocytopenia. SCDs already ordered   CODE STATUS: full  TOTAL TIME TAKING CARE OF THIS PATIENT: 21 minutes.   PT for d/c planning  POSSIBLE D/C 1-2 days, DEPENDING ON CLINICAL CONDITION.   MODY, SITAL M.D on 08/10/2018 at 11:33 AM  After 6pm go to [email or web address] - password [email or web address] [facility]  Office  [phone]  CC: Primary care physician; Olin Hauser, DO  Note: This dictation was prepared with  Dragon dictation along with smaller phrase technology. Any transcriptional errors that result from this process are unintentional.

## 2018-08-11 LAB — BASIC METABOLIC PANEL
Anion gap: 8 (ref 5–15)
BUN: 75 mg/dL — AB (ref 8–23)
CO2: 21 mmol/L — ABNORMAL LOW (ref 22–32)
Calcium: 8.3 mg/dL — ABNORMAL LOW (ref 8.9–10.3)
Chloride: 116 mmol/L — ABNORMAL HIGH (ref 98–111)
Creatinine, Ser: 2.42 mg/dL — ABNORMAL HIGH (ref 0.61–1.24)
GFR calc Af Amer: 32 mL/min — ABNORMAL LOW (ref >60)
GFR calc non Af Amer: 28 mL/min — ABNORMAL LOW (ref >60)
Glucose, Bld: 109 mg/dL — ABNORMAL HIGH (ref 70–99)
Potassium: 4.4 mmol/L (ref 3.5–5.1)
Sodium: 145 mmol/L (ref 135–145)

## 2018-08-11 LAB — CBC WITH DIFFERENTIAL/PLATELET
Abs Immature Granulocytes: 0.04 10*3/uL (ref 0.00–0.07)
Basophils Absolute: 0.1 10*3/uL (ref 0.0–0.1)
Basophils Relative: 1 %
EOS ABS: 0.1 10*3/uL (ref 0.0–0.5)
Eosinophils Relative: 2 %
HCT: 32.3 % — ABNORMAL LOW (ref 39.0–52.0)
Hemoglobin: 10.3 g/dL — ABNORMAL LOW (ref 13.0–17.0)
Immature Granulocytes: 1 %
LYMPHS ABS: 0.8 10*3/uL (ref 0.7–4.0)
Lymphocytes Relative: 18 %
MCH: 32.1 pg (ref 26.0–34.0)
MCHC: 31.9 g/dL (ref 30.0–36.0)
MCV: 100.6 fL — ABNORMAL HIGH (ref 80.0–100.0)
Monocytes Absolute: 0.6 10*3/uL (ref 0.1–1.0)
Monocytes Relative: 13 %
Neutro Abs: 2.8 10*3/uL (ref 1.7–7.7)
Neutrophils Relative %: 65 %
Platelets: 86 10*3/uL — ABNORMAL LOW (ref 150–400)
RBC: 3.21 MIL/uL — ABNORMAL LOW (ref 4.22–5.81)
RDW: 15.2 % (ref 11.5–15.5)
WBC: 4.3 10*3/uL (ref 4.0–10.5)
nRBC: 0 % (ref 0.0–0.2)

## 2018-08-11 MED ORDER — LEVOFLOXACIN 750 MG PO TABS
750.0000 mg | ORAL_TABLET | ORAL | Status: DC
  Administered 2018-08-11: 10:00:00 750 mg via ORAL
  Filled 2018-08-11: qty 1

## 2018-08-11 MED ORDER — LEVOFLOXACIN 750 MG PO TABS: 750.0000 mg | ORAL_TABLET | ORAL | Status: DC

## 2018-08-11 MED ORDER — ENSURE ENLIVE PO LIQD: 237.0000 mL | Freq: Two times a day (BID) | ORAL | Status: DC

## 2018-08-11 NOTE — Progress Notes (Signed)
With am pt assessment pt verbalized that he has been on Revlimid in the past but has not taken it in over 1 week; Chemotherapy Precautions discontinued

## 2018-08-11 NOTE — Progress Notes (Signed)
Pharmacy ICU Monitoring Consult:  Pharmacy consulted to assist in monitoring and replacing electrolytes, constipation, and glucose management in this 63 y.o. male admitted on 08/05/2018 with sepsis/pneumonia. Patient transferred from ICU on 1/4.   Labs:  Sodium (mmol/L)  Date Value  08/11/2018 145   Potassium (mmol/L)  Date Value  08/11/2018 4.4   Magnesium (mg/dL)  Date Value  08/09/2018 2.4   Phosphorus (mg/dL)  Date Value  08/09/2018 4.3   Calcium (mg/dL)  Date Value  08/11/2018 8.3 (L)   Albumin (g/dL)  Date Value  08/07/2018 2.5 (L)  02/07/2016 4.1    Assessment/Plan:  Patient transferred to floor and not requiring further pharmacy interventions. Will complete consult and continue to follow along with daily rounding.   Simpson,Michael L, 08/11/2018 11:09 AM

## 2018-08-11 NOTE — NC FL2 (Signed)
[place] LEVEL OF CARE SCREENING TOOL     IDENTIFICATION  Patient Name: Kyle Parker Birthdate: Mar 22, 1956 Sex: male Admission Date (Current Location): 08/05/2018  [place] and [place] Number:  Engineering geologist and Address:  [facility], [address] [place], [place] [address]      Provider Number: [id number]  Attending Physician Name and Address:  Bettey Costa, MD  Relative Name and Phone Number:  Ercil Cassis [facility]) [phone]    Current Level of Care: Hospital Recommended Level of Care: [place] Prior Approval Number:    Date Approved/Denied:   PASRR Number: [id number]  Discharge Plan: SNF    Current Diagnoses: Patient Active Problem List   Diagnosis Date Noted  . AKI (acute kidney injury) ([place]) 08/06/2018  . Neutropenia ([place])   . Multiple myeloma in remission ([place])   . Febrile neutropenia ([place]) 08/05/2018  . Community acquired pneumonia of right lung 08/05/2018  . Sepsis ([place]) 08/05/2018  . Colon cancer screening 09/08/2017  . BPH with obstruction/lower urinary tract symptoms 09/07/2017  . Multiple myeloma without remission ([place]) 03/21/2016  . Cervical mass 03/14/2016  . Muscle spasms of neck 03/07/2016  . Tinnitus of both ears 02/07/2016  . Onychomycosis 01/23/2016    Orientation RESPIRATION BLADDER Height & Weight     Self, Time, Situation, Place  O2(Acute o2 3L) Continent Weight: 272 lb 7.8 oz (123.6 kg) Height:  6' (182.9 cm)  BEHAVIORAL SYMPTOMS/MOOD NEUROLOGICAL BOWEL NUTRITION STATUS      Continent    AMBULATORY STATUS COMMUNICATION OF NEEDS Skin   Extensive Assist Verbally Normal                       Personal Care Assistance Level of Assistance  Bathing, Feeding, Dressing Bathing Assistance: Limited assistance Feeding assistance: Independent Dressing Assistance: Limited assistance     Functional Limitations Info  Sight, Hearing, Speech Sight Info: Adequate Hearing Info:  Adequate Speech Info: Adequate    SPECIAL CARE FACTORS FREQUENCY  PT (By licensed PT)     PT Frequency: Up to 5X per week              Contractures Contractures Info: Not present    Additional Factors Info  Code Status, Allergies Code Status Info: Full Allergies Info: No Known Allergies           Current Medications (08/11/2018):  This is the current hospital active medication list Current Facility-Administered Medications  Medication Dose Route Frequency Provider Last Rate Last Dose  . 0.9 %  sodium chloride infusion   Intravenous PRN Awilda Bill, NP   Stopped at 08/08/18 0830  . acetaminophen (TYLENOL) tablet 650 mg  650 mg Oral Q6H PRN Lance Coon, MD       Or  . acetaminophen (TYLENOL) suppository 650 mg  650 mg Rectal Q6H PRN Lance Coon, MD      . acyclovir (ZOVIRAX) 200 MG capsule 400 mg  400 mg Oral BID Bettey Costa, MD   400 mg at 08/11/18 1011  . aspirin chewable tablet 162 mg  162 mg Per Tube Daily Conforti, John, DO   162 mg at 08/11/18 1011  . budesonide (PULMICORT) nebulizer solution 0.5 mg  0.5 mg Nebulization BID Awilda Bill, NP   0.5 mg at 08/11/18 0929  . chlorhexidine gluconate (MEDLINE KIT) (PERIDEX) 0.12 % solution 15 mL  15 mL Mouth Rinse BID Conforti, John, DO   15 mL at 08/10/18 2104  . [  START ON 08/12/2018] feeding supplement (ENSURE ENLIVE) (ENSURE ENLIVE) liquid 237 mL  237 mL Oral BID BM Mody, Sital, MD      . fentaNYL (SUBLIMAZE) injection 25 mcg  25 mcg Intravenous Q4H PRN Conforti, John, DO   25 mcg at 08/09/18 2211  . gabapentin (NEURONTIN) capsule 600 mg  600 mg Oral BID Bettey Costa, MD   600 mg at 08/11/18 1011  . ipratropium-albuterol (DUONEB) 0.5-2.5 (3) MG/3ML nebulizer solution 3 mL  3 mL Nebulization Q4H PRN Lance Coon, MD      . ipratropium-albuterol (DUONEB) 0.5-2.5 (3) MG/3ML nebulizer solution 3 mL  3 mL Nebulization Q6H Awilda Bill, NP   3 mL at 08/11/18 1512  . [START ON 08/13/2018] levofloxacin (LEVAQUIN) tablet  750 mg  750 mg Oral Q48H Charlett Nose, RPH      . pantoprazole (PROTONIX) EC tablet 40 mg  40 mg Oral Daily Mody, Sital, MD   40 mg at 08/11/18 1011  . phenol (CHLORASEPTIC) mouth spray 1 spray  1 spray Mouth/Throat PRN Conforti, John, DO      . senna-docusate (Senokot-S) tablet 1 tablet  1 tablet Per Tube QHS PRN Hallaji, Sheema M, RPH      . sodium chloride flush (NS) 0.9 % injection 10-40 mL  10-40 mL Intracatheter Q12H Mody, Sital, MD   10 mL at 08/10/18 1011  . sodium chloride flush (NS) 0.9 % injection 10-40 mL  10-40 mL Intracatheter PRN Bettey Costa, MD         Discharge Medications: Please see discharge summary for a list of discharge medications.  Relevant Imaging Results:  Relevant Lab Results:   Additional Information SS# [id number]  Zettie Pho, LCSW

## 2018-08-11 NOTE — Progress Notes (Signed)
[place] at [place] NAME: Kyle Parker    MR#:  [id number]  DATE OF BIRTH:  [birth year]  SUBJECTIVE:  Doing much better this morning.  Still has a cough however much improved.  Denies shortness of breath REVIEW OF SYSTEMS:    Review of Systems  Constitutional: Negative.  Negative for chills, fever and malaise/fatigue.  HENT: Negative.  Negative for ear discharge, ear pain, hearing loss, nosebleeds and sore throat.   Eyes: Negative.  Negative for blurred vision and pain.  Respiratory: Positive for cough. Negative for hemoptysis, shortness of breath and wheezing.   Cardiovascular: Negative.  Negative for chest pain, palpitations and leg swelling.  Gastrointestinal: Negative.  Negative for abdominal pain, blood in stool, diarrhea, nausea and vomiting.  Genitourinary: Negative.  Negative for dysuria.  Musculoskeletal: Negative.  Negative for back pain.  Skin: Negative.   Neurological: Negative for dizziness, tremors, speech change, focal weakness, seizures and headaches.  Endo/Heme/Allergies: Negative.  Does not bruise/bleed easily.  Psychiatric/Behavioral: Negative.  Negative for depression, hallucinations and suicidal ideas.      DRUG ALLERGIES:  No Known Allergies  VITALS:  Blood pressure (!) 110/55, pulse (!) 56, temperature 98.4 F (36.9 C), temperature source Oral, resp. rate 16, height 6' (1.829 m), weight 123.6 kg, SpO2 94 %.  PHYSICAL EXAMINATION:   Physical Exam  Constitutional: He is oriented to person, place, and time. He appears well-developed and well-nourished.  HENT:  Head: Normocephalic and atraumatic.  Eyes: Pupils are equal, round, and reactive to light. Conjunctivae are normal.  Neck: Normal range of motion.  Cardiovascular: Normal rate and regular rhythm.  Respiratory: Effort normal and breath sounds normal. No stridor.  GI: Soft. Bowel sounds are normal. He exhibits no distension. There is no abdominal  tenderness.  Musculoskeletal: Normal range of motion.  Neurological: He is oriented to person, place, and time.  Skin: Skin is warm and dry.       LABORATORY PANEL:   CBC Recent Labs  Lab 08/11/18 0821  WBC 4.3  HGB 10.3*  HCT 32.3*  PLT 86*   ------------------------------------------------------------------------------------------------------------------  Chemistries  Recent Labs  Lab 08/05/18 2056  08/09/18 0444  08/11/18 0821  NA 135   < > 146*   < > 145  K 4.0   < > 4.0   < > 4.4  CL 103   < > 116*   < > 116*  CO2 20*   < > 25   < > 21*  GLUCOSE 140*   < > 107*   < > 109*  BUN 36*   < > 58*   < > 75*  CREATININE 3.99*   < > 2.86*   < > 2.42*  CALCIUM 8.4*   < > 8.4*   < > 8.3*  MG  --    < > 2.4  --   --   AST 20  --   --   --   --   ALT 19  --   --   --   --   ALKPHOS 28*  --   --   --   --   BILITOT 1.6*  --   --   --   --    < > = values in this interval not displayed.   ------------------------------------------------------------------------------------------------------------------  Cardiac Enzymes Recent Labs  Lab 08/05/18 2056  TROPONINI <0.03   ------------------------------------------------------------------------------------------------------------------  RADIOLOGY:  No results found.   ASSESSMENT AND PLAN:  63 year old male with history of multiple myeloma status post bone marrow transplant March 2018 who presents to the emergency room due to fever and cough.  1.  Septic shock with acute hypoxic respiratory failure due to multilobar pneumonia and neutropenia: Patient was intubated on 08/06/2018 due to progressive respiratory failure secondary to acute pneumonia and extubated 1/3. Wean off [place] as tolerated Septic shock has resolved Stop vancomycin.  Rocephin for 1 more day for the treatment of pneumonia. Continue Levaquin for the treatment of multilobar pneumonia.  2.  Septic shock in the setting of pneumococcus pneumonia multilobar  pneumonia: Resolved Plan as outlined above   3 BPH: Continue Flomax  4.  Acute Kidney injury in the setting of septic shock:  Renal function has shown improvement Continue to monitor   5.  Multiple myeloma chronic thrombocytopenia patient status post bone marrow transplant in March 2018.   Oncology following consult appreciated.  6.  Thrombocytopenia: Platelet count has improved.  Neutropenia resolved.  7.  Hypernatremia: Stop IV fluids.  Sodium level has improved.   Patient will be discharged tomorrow to skilled nursing facility Discussed with wife and patient at bedside.  CODE STATUS: full  TOTAL TIME TAKING CARE OF THIS PATIENT: 28 minutes.    POSSIBLE D/C tomorrow DEPENDING ON CLINICAL CONDITION.   Deserai Cansler M.D on 08/11/2018 at 62:26 AM  After 6pm go to [email or web address] - password [email or web address] [place] [facility]  Office  [phone]  CC: Primary care physician; Olin Hauser, DO  Note: This dictation was prepared with Dragon dictation along with smaller phrase technology. Any transcriptional errors that result from this process are unintentional.

## 2018-08-12 LAB — CBC WITH DIFFERENTIAL/PLATELET
Abs Immature Granulocytes: 0.04 10*3/uL (ref 0.00–0.07)
BASOS PCT: 1 %
Basophils Absolute: 0 10*3/uL (ref 0.0–0.1)
Eosinophils Absolute: 0.1 10*3/uL (ref 0.0–0.5)
Eosinophils Relative: 2 %
HCT: 30.2 % — ABNORMAL LOW (ref 39.0–52.0)
Hemoglobin: 9.7 g/dL — ABNORMAL LOW (ref 13.0–17.0)
Immature Granulocytes: 1 %
Lymphocytes Relative: 20 %
Lymphs Abs: 0.7 10*3/uL (ref 0.7–4.0)
MCH: 31.8 pg (ref 26.0–34.0)
MCHC: 32.1 g/dL (ref 30.0–36.0)
MCV: 99 fL (ref 80.0–100.0)
MONO ABS: 0.5 10*3/uL (ref 0.1–1.0)
MONOS PCT: 15 %
Neutro Abs: 2.2 10*3/uL (ref 1.7–7.7)
Neutrophils Relative %: 61 %
Platelets: 88 10*3/uL — ABNORMAL LOW (ref 150–400)
RBC: 3.05 MIL/uL — AB (ref 4.22–5.81)
RDW: 14.7 % (ref 11.5–15.5)
WBC: 3.6 10*3/uL — ABNORMAL LOW (ref 4.0–10.5)
nRBC: 0 % (ref 0.0–0.2)

## 2018-08-12 MED ORDER — LEVOFLOXACIN 750 MG PO TABS: 750.0000 mg | ORAL_TABLET | ORAL | 0 refills | Status: DC

## 2018-08-12 MED ORDER — IPRATROPIUM-ALBUTEROL 0.5-2.5 (3) MG/3ML IN SOLN
3.0000 mL | Freq: Three times a day (TID) | RESPIRATORY_TRACT | Status: DC
  Administered 2018-08-12: 3 mL via RESPIRATORY_TRACT
  Filled 2018-08-12: qty 3

## 2018-08-12 MED ORDER — GUAIFENESIN-DM 100-10 MG/5ML PO SYRP
5.0000 mL | ORAL_SOLUTION | ORAL | Status: DC | PRN
  Filled 2018-08-12: qty 5

## 2018-08-12 NOTE — Progress Notes (Signed)
   Date of Admission:  08/05/2018     ID: Kyle Parker is a 63 y.o. male  Principal Problem:   Sepsis ([place]) Active Problems:    Febrile neutropenia ([place])   Community acquired pneumonia of right lung   AKI (acute kidney injury) ([place])    Multiple myeloma in remission ([place])    Subjective: Doing well No fever Some cough   Medications:  . acyclovir  400 mg Oral BID  . aspirin  162 mg Per Tube Daily  . budesonide (PULMICORT) nebulizer solution  0.5 mg Nebulization BID  . chlorhexidine gluconate (MEDLINE KIT)  15 mL Mouth Rinse BID  . feeding supplement (ENSURE ENLIVE)  237 mL Oral BID BM  . gabapentin  600 mg Oral BID  . ipratropium-albuterol  3 mL Nebulization TID  . [START ON 08/13/2018] levofloxacin  750 mg Oral Q48H  . pantoprazole  40 mg Oral Daily  . sodium chloride flush  10-40 mL Intracatheter Q12H    Objective: Vital signs in last 24 hours: Temp:  [97.5 F (36.4 C)-98.1 F (36.7 C)] 97.5 F (36.4 C) (01/06 1140) Pulse Rate:  [54-57] 54 (01/06 1140) Resp:  [18] 18 (01/06 1140) BP: (99-128)/(63-78) 99/63 (01/06 1140) SpO2:  [95 %-97 %] 95 % (01/06 1140) Weight:  [123.7 kg] 123.7 kg (01/06 0500)  PHYSICAL EXAM:  General: Alert, cooperative, no distress, appears stated age.  Head: Normocephalic, without obvious abnormality, atraumatic. Eyes: Conjunctivae clear, anicteric sclerae. Pupils are equal ENT Nares normal. No drainage or sinus tenderness. Lips, mucosa, and tongue normal. No Thrush Neck: Supple, symmetrical, no adenopathy, thyroid: non tender no carotid bruit and no JVD. Back: No CVA tenderness. Lungs: b/l basal crepts Heart: Regular rate and rhythm, no murmur, rub or gallop. Abdomen: Soft, non-tender,not distended. Bowel sounds normal. No masses Extremities: atraumatic, no cyanosis. No edema. No clubbing Skin: No rashes or lesions. Or bruising Lymph: Cervical, supraclavicular normal. Neurologic: Grossly non-focal  Lab Results Recent Labs   08/10/18 0419 08/11/18 0821 08/12/18 0439  WBC 4.9 4.3 3.6*  HGB 10.2* 10.3* 9.7*  HCT 32.1* 32.3* 30.2*  NA 150* 145  --   K 3.9 4.4  --   CL 121* 116*  --   CO2 23 21*  --   BUN 68* 75*  --   CREATININE 2.61* 2.42*  --       Studies/Results: No results found.   Assessment/Plan: Kyle Suski Perryis a 63 y.o.with a history ofmultiple myeloma status post autologous bone marrow transplantation March 2018 , now in remission and on lenalidoamideand followed at [place] presents withfever andacute shortness of breathof 1 day duration.  ?Multilobar pneumonia with acute hypoxic resp failure- extubated  He is immune compromised secondary to MM and Lenalidomide- This ispneumococcuspneumonia as urine antigen positive- wa son IV ceftriaxone and Iv vanco- is getting discharged on Po levaquin  - candida tropicalis in the BAL fluid is likely colonization - will not treat as clinically much improved, extubated, CXR is better and chances of candida causing pneumonia is unusual    Corona virus in resp viral PCR u-unlikely the cause for this pneumonia  Neutropenia ( resolved)and thrombocytopenia combination of infection and bone marrow suppression by Lenalidomide. Seen by heme onc and getting GCSF- resolved  AKI- improved   Multiple Myeloma- s/p autologous BMT  ?Cervical vertebra compression fracture- S/p surgery with hardware  Wife will verify with [facility] regarding pneumococcal vaccination  Discussed with patient and his wife

## 2018-08-12 NOTE — Progress Notes (Signed)
Physical Therapy Treatment Patient Details Name: Kyle Parker MRN: [id number] DOB: [birth year] Today's Date: 08/12/2018    History of Present Illness Kyle Parker is a 63yo male who comes [facility] on 12/30 c SOB, 2D malaise, foudn to have multifocal PNA. Pt underwent intubation 12/31-1/3. PMH: multiple myeloma c bony mets (15lb lifting restriction), C2/3 compression fractures. PTA pt was a fully independent community dweeling adult.     PT Comments     [name] made significant progress with all aspects of mobility.  Pt able to ambulate 160 ft using RW with supervision for safety.  Dec gait speed and SOB as pt increases ambulatory distance.  Several standing rest breaks (encouraged by this therapist) as pt becomes SOB and with one episode of SpO2 dropping to 89% on RA.  Pt otherwise remains at or above 91% on RA throughout session. Educated pt in importance of taking standing or seated rest breaks when he becomes SOB to allow recovery before proceeding, pt verbalized understanding. Pt educated in pursed lip breathing and practiced throughout session.  Follow up recommendations updated to reflect pt's progress.   Follow Up Recommendations  Supervision for mobility/OOB;Home health PT     Equipment Recommendations  Rolling walker with 5" wheels    Recommendations for Other Services       Precautions / Restrictions Precautions Precautions: Fall;Other (comment) Precaution Comments: Per eval note, 15lb lifting restriction 2/2 history of bony mets and prior spine fractures. Restrictions Weight Bearing Restrictions: No    Mobility  Bed Mobility Overal bed mobility: Modified Independent             General bed mobility comments: Increased time and effort with HOB elevated but no physical assist or cues needed.  Transfers Overall transfer level: Needs assistance Equipment used: Rolling walker (2 wheeled) Transfers: Sit to/from Stand Sit to Stand: Min guard         General  transfer comment: Some difficulty with initial phase of sit>stand due to weakness so min guard provided but pt remains steady and proper hand placement with verbal cues provided.   Ambulation/Gait Ambulation/Gait assistance: Supervision Gait Distance (Feet): 160 Feet Assistive device: Rolling walker (2 wheeled) Gait Pattern/deviations: Step-through pattern;Decreased step length - right;Decreased step length - left Gait velocity: decreased   General Gait Details: Dec gait speed and SOB as pt increases ambulatory distance.  Several standing rest breaks (encouraged by this therapist) as pt becomes SOB and with one episode of SpO2 dropping to 89% on RA.  Pt otherwise remains at or above 91% on RA throughout session.    Stairs             Wheelchair Mobility    Modified Rankin (Stroke Patients Only)       Balance Overall balance assessment: Needs assistance Sitting-balance support: No upper extremity supported;Feet supported Sitting balance-Leahy Scale: Good     Standing balance support: No upper extremity supported;During functional activity Standing balance-Leahy Scale: Fair Standing balance comment: Pt able to stand statically without UE support but utilizes RW to ambulate for stability and endurance                            Cognition Arousal/Alertness: Awake/alert Behavior During Therapy: WFL for tasks assessed/performed Overall Cognitive Status: Within Functional Limits for tasks assessed  Exercises Other Exercises Other Exercises: Encouraged pt to ambulate with nursing staff at least 3x/day    General Comments General comments (skin integrity, edema, etc.): Wife present during session      Pertinent Vitals/Pain Pain Assessment: No/denies pain    Home Living                      Prior Function            PT Goals (current goals can now be found in the care plan section) Acute  Rehab PT Goals Patient Stated Goal: to be able to go home at d/c PT Goal Formulation: With patient Time For Goal Achievement: 08/24/18 Potential to Achieve Goals: Good Progress towards PT goals: Progressing toward goals    Frequency    Min 2X/week      PT Plan Discharge plan needs to be updated    Co-evaluation              AM-PAC PT "6 Clicks" Mobility   Outcome Measure  Help needed turning from your back to your side while in a flat bed without using bedrails?: A Little Help needed moving from lying on your back to sitting on the side of a flat bed without using bedrails?: A Little Help needed moving to and from a bed to a chair (including a wheelchair)?: A Little Help needed standing up from a chair using your arms (e.g., wheelchair or bedside chair)?: A Little Help needed to walk in hospital room?: A Little Help needed climbing 3-5 steps with a railing? : A Little 6 Click Score: 18    End of Session Equipment Utilized During Treatment: Gait belt Activity Tolerance: Patient tolerated treatment well Patient left: with call bell/phone within reach;in bed;with family/visitor present Nurse Communication: Mobility status PT Visit Diagnosis: Muscle weakness (generalized) (M62.81);Other abnormalities of gait and mobility (R26.89)     Time: 1610-9604 PT Time Calculation (min) (ACUTE ONLY): 31 min  Charges:  $Gait Training: 23-37 mins                     Collie Siad PT, DPT 08/12/2018, 11:53 AM

## 2018-08-12 NOTE — Discharge Summary (Signed)
[facility] [place] NAME: Kyle Parker    MR#:  [id number]  DATE OF BIRTH:  Dec 06, 1955  DATE OF ADMISSION:  08/05/2018 ADMITTING PHYSICIAN: Lance Coon, MD  DATE OF DISCHARGE: 08/12/2018  PRIMARY CARE PHYSICIAN: Olin Hauser, DO    ADMISSION DIAGNOSIS:  Neutropenia, unspecified type ([place]) [D70.9] Community acquired pneumonia of right lung, unspecified part of lung [J18.9] Sepsis, due to unspecified organism, unspecified whether acute organ dysfunction present ([place]) [A41.9]  DISCHARGE DIAGNOSIS:  Principal Problem:   Sepsis ([place]) Active Problems:   Multiple myeloma without remission ([place])   BPH with obstruction/lower urinary tract symptoms   Febrile neutropenia ([place])   Community acquired pneumonia of right lung   AKI (acute kidney injury) ([place])   Neutropenia ([place])   Multiple myeloma in remission ([place])   SECONDARY DIAGNOSIS:   Past Medical History:  Diagnosis Date  . Arthritis   . Compression fracture of cervical spine at C2-C3 level    Rods in neck. limited L/R movement  . Lytic bone lesions on xray   . Multiple myeloma without remission [facility])     HOSPITAL COURSE:  63 year old male with history of multiple myeloma status post bone marrow transplant March 2018 who presents to the emergency room due to fever and cough.  1.  Septic shock with acute hypoxic respiratory failure due to multilobar pneumonia and neutropenia: Patient was intubated on 08/06/2018 due to progressive respiratory failure secondary to acute pneumonia and extubated 1/3. He has been weaned off of oxygen.   Septic shock has resolved He will continue Levaquin for the treatment of multilobar pneumonia.  2.  Septic shock in the setting of pneumococcus pneumonia multilobar pneumonia: This has resolved   3 BPH: Continue Flomax  4.  Acute Kidney injury in the setting of septic shock:  Renal function has improved.   5.  Multiple myeloma  chronic thrombocytopenia patient status post bone marrow transplant in March 2018.   He will follow-up with his outpatient oncologist.    6.  Thrombocytopenia: Platelet count has improved.  Neutropenia has resolved.  7.  Hypernatremia: Sodium is normalized.  DISCHARGE CONDITIONS AND DIET:   Stable for discharge on regular diet  CONSULTS OBTAINED:  Treatment Team:  Earlie Server, MD  DRUG ALLERGIES:  No Known Allergies  DISCHARGE MEDICATIONS:   Allergies as of 08/12/2018   No Known Allergies     Medication List    STOP taking these medications   amoxicillin-clavulanate 875-125 MG tablet Commonly known as:  AUGMENTIN   baclofen 10 MG tablet Commonly known as:  LIORESAL   ipratropium 0.06 % nasal spray Commonly known as:  ATROVENT   naproxen 500 MG tablet Commonly known as:  NAPROSYN   ondansetron 4 MG disintegrating tablet Commonly known as:  ZOFRAN ODT     TAKE these medications   acyclovir 400 MG tablet Commonly known as:  ZOVIRAX Take 1 tablet (400 mg total) by mouth 2 (two) times daily.   aspirin EC 81 MG tablet Take 162 mg by mouth daily.   gabapentin 300 MG capsule Commonly known as:  NEURONTIN Take 600 mg by mouth 3 (three) times daily.   levofloxacin 750 MG tablet Commonly known as:  LEVAQUIN Take 1 tablet (750 mg total) by mouth every other day for 4 days. Start taking on:  August 13, 2018   omeprazole 20 MG capsule Commonly known as:  PRILOSEC Take 20 mg by mouth daily.   RAPAFLO 4 MG  Caps capsule Generic drug:  silodosin Take 1 capsule (4 mg total) by mouth daily with breakfast.   REVLIMID 10 MG capsule Generic drug:  lenalidomide Take 10 mg by mouth daily. What changed:  Another medication with the same name was removed. Continue taking this medication, and follow the directions you see here.   Vitamin D (Ergocalciferol) 1.25 MG (50000 UT) Caps capsule Commonly known as:  DRISDOL Take 50,000 Units by mouth every Tuesday.          Today   CHIEF COMPLAINT:  No acute issues overnight   VITAL SIGNS:  Blood pressure 120/69, pulse (!) 56, temperature 97.7 F (36.5 C), resp. rate 18, height 6' (1.829 m), weight 123.7 kg, SpO2 97 %.   REVIEW OF SYSTEMS:  Review of Systems  Constitutional: Positive for malaise/fatigue. Negative for chills and fever.  HENT: Negative.  Negative for ear discharge, ear pain, hearing loss, nosebleeds and sore throat.   Eyes: Negative.  Negative for blurred vision and pain.  Respiratory: Negative.  Negative for cough, hemoptysis, shortness of breath and wheezing.   Cardiovascular: Negative.  Negative for chest pain, palpitations and leg swelling.  Gastrointestinal: Negative.  Negative for abdominal pain, blood in stool, diarrhea, nausea and vomiting.  Genitourinary: Negative.  Negative for dysuria.  Musculoskeletal: Negative.  Negative for back pain.  Skin: Negative.   Neurological: Negative for dizziness, tremors, speech change, focal weakness, seizures and headaches.  Endo/Heme/Allergies: Negative.  Does not bruise/bleed easily.  Psychiatric/Behavioral: Negative.  Negative for depression, hallucinations and suicidal ideas.     PHYSICAL EXAMINATION:  GENERAL:  63 y.o.-year-old patient lying in the bed with no acute distress.  NECK:  Supple, no jugular venous distention. No thyroid enlargement, no tenderness.  LUNGS: Normal breath sounds bilaterally, no wheezing, rales,rhonchi  No use of accessory muscles of respiration.  CARDIOVASCULAR: S1, S2 normal. No murmurs, rubs, or gallops.  ABDOMEN: Soft, non-tender, non-distended. Bowel sounds present. No organomegaly or mass.  EXTREMITIES: No pedal edema, cyanosis, or clubbing.  PSYCHIATRIC: The patient is alert and oriented x 3.  SKIN: No obvious rash, lesion, or ulcer.   DATA REVIEW:   CBC Recent Labs  Lab 08/12/18 0439  WBC 3.6*  HGB 9.7*  HCT 30.2*  PLT 88*    Chemistries  Recent Labs  Lab 08/05/18 2056   08/09/18 0444  08/11/18 0821  NA 135   < > 146*   < > 145  K 4.0   < > 4.0   < > 4.4  CL 103   < > 116*   < > 116*  CO2 20*   < > 25   < > 21*  GLUCOSE 140*   < > 107*   < > 109*  BUN 36*   < > 58*   < > 75*  CREATININE 3.99*   < > 2.86*   < > 2.42*  CALCIUM 8.4*   < > 8.4*   < > 8.3*  MG  --    < > 2.4  --   --   AST 20  --   --   --   --   ALT 19  --   --   --   --   ALKPHOS 28*  --   --   --   --   BILITOT 1.6*  --   --   --   --    < > = values in this interval not displayed.    Cardiac  Enzymes Recent Labs  Lab 08/05/18 2056  TROPONINI <0.03    Microbiology Results  _0 @  RADIOLOGY:  No results found.    Allergies as of 08/12/2018   No Known Allergies     Medication List    STOP taking these medications   amoxicillin-clavulanate 875-125 MG tablet Commonly known as:  AUGMENTIN   baclofen 10 MG tablet Commonly known as:  LIORESAL   ipratropium 0.06 % nasal spray Commonly known as:  ATROVENT   naproxen 500 MG tablet Commonly known as:  NAPROSYN   ondansetron 4 MG disintegrating tablet Commonly known as:  ZOFRAN ODT     TAKE these medications   acyclovir 400 MG tablet Commonly known as:  ZOVIRAX Take 1 tablet (400 mg total) by mouth 2 (two) times daily.   aspirin EC 81 MG tablet Take 162 mg by mouth daily.   gabapentin 300 MG capsule Commonly known as:  NEURONTIN Take 600 mg by mouth 3 (three) times daily.   levofloxacin 750 MG tablet Commonly known as:  LEVAQUIN Take 1 tablet (750 mg total) by mouth every other day for 4 days. Start taking on:  August 13, 2018   omeprazole 20 MG capsule Commonly known as:  PRILOSEC Take 20 mg by mouth daily.   RAPAFLO 4 MG Caps capsule Generic drug:  silodosin Take 1 capsule (4 mg total) by mouth daily with breakfast.   REVLIMID 10 MG capsule Generic drug:  lenalidomide Take 10 mg by mouth daily. What changed:  Another medication with the same name was removed. Continue taking this  medication, and follow the directions you see here.   Vitamin D (Ergocalciferol) 1.25 MG (50000 UT) Caps capsule Commonly known as:  DRISDOL Take 50,000 Units by mouth every Tuesday.         Management plans discussed with the patient and he is in agreement. Stable for discharge home with [facility]  Patient should follow up with pcp  CODE STATUS:     Code Status Orders  (From admission, onward)         Start     Ordered   08/06/18 0015  Full code  Continuous     08/06/18 0014        Code Status History    This patient has a current code status but no historical code status.      TOTAL TIME TAKING CARE OF THIS PATIENT: 38 minutes.    Note: This dictation was prepared with Dragon dictation along with smaller phrase technology. Any transcriptional errors that result from this process are unintentional.  Burlon Centrella M.D on 08/12/2018 at 11:25 AM  Between 7am to 6pm - Pager - [phone] After 6pm go to [email or web address] - password [email or web address] [facility]  Office  [phone]  CC: Primary care physician; Olin Hauser, DO

## 2018-08-12 NOTE — Clinical Social Work Note (Signed)
CSW notified by PT that patient did much better with them today and they are changing recommendation to home with home health. Patient and wife are in agreement with plan. RNCM is aware. CSW signing off. Please re consult if further needs arise.   [place], [place]

## 2018-08-14 ENCOUNTER — Telehealth: Payer: Self-pay | Admitting: Family Medicine

## 2018-08-14 ENCOUNTER — Telehealth: Payer: Self-pay

## 2018-08-14 NOTE — Telephone Encounter (Signed)
I have made the 1st attempt to contact the patient or family member in charge, in order to follow up from recently being discharged from the hospital. I left a message on voicemail requesting a CB. -MM 

## 2018-08-14 NOTE — Telephone Encounter (Signed)
Called Alina back, patient was seen in hospital recently for pneumonia, discharged with skilled nursing, requested a verbal order to start care, his hospital follow-up with me is on Friday 08/16/18.  Nobie Putnam, [facility] 08/14/2018, 5:22 PM

## 2018-08-14 NOTE — Telephone Encounter (Signed)
Kyle Parker with [facility] needs verbal for pt [phone]

## 2018-08-15 NOTE — Telephone Encounter (Signed)
I have made the 2nd attempt to contact the patient or family member in charge, in order to follow up from recently being discharged from the hospital. I left a message on voicemail requesting a CB. -MM  

## 2018-08-16 ENCOUNTER — Encounter: Payer: Self-pay | Admitting: Family Medicine

## 2018-08-16 ENCOUNTER — Ambulatory Visit (INDEPENDENT_AMBULATORY_CARE_PROVIDER_SITE_OTHER): Payer: BLUE CROSS/BLUE SHIELD | Admitting: Family Medicine

## 2018-08-16 VITALS — BP 95/61 | HR 73 | Temp 98.1°F | Resp 16 | Ht 72.0 in | Wt 265.0 lb

## 2018-08-16 DIAGNOSIS — N401 Enlarged prostate with lower urinary tract symptoms: Secondary | ICD-10-CM

## 2018-08-16 DIAGNOSIS — Z8619 Personal history of other infectious and parasitic diseases: Secondary | ICD-10-CM

## 2018-08-16 DIAGNOSIS — Z8709 Personal history of other diseases of the respiratory system: Secondary | ICD-10-CM

## 2018-08-16 DIAGNOSIS — J181 Lobar pneumonia, unspecified organism: Secondary | ICD-10-CM

## 2018-08-16 DIAGNOSIS — N138 Other obstructive and reflux uropathy: Secondary | ICD-10-CM

## 2018-08-16 DIAGNOSIS — J189 Pneumonia, unspecified organism: Secondary | ICD-10-CM

## 2018-08-16 MED ORDER — RAPAFLO 8 MG PO CAPS: 8.0000 mg | ORAL_CAPSULE | Freq: Every day | ORAL | 5 refills | Status: DC

## 2018-08-16 NOTE — Patient Instructions (Addendum)
Thank you for coming to the office today.  Keep up the good work overall, I am impressed.  Increase dose of Rapaflo from 4mg  up to 8mg  now may double up your current dose - if we have to switch over to [facility] Silodosin we can do that, let us or know or pharmacy will keep [place] posted.  Follow-up in future as needed - if worsening symptoms we may need to reconsider Pulmonology referral for 2nd opinion and maybe further imaging.   Please schedule a Follow-up Appointment to: Return in about 4 weeks (around 09/13/2018) for 4 week follow-up BPH, history of pneumonia.  If you have any other questions or concerns, please feel free to call the office or send a message through [place]. You may also schedule an earlier appointment if necessary.  Additionally, you may be receiving a survey about your experience at our office within a few days to 1 week by e-mail or mail. We value your feedback.  Nobie Putnam, DO [place]

## 2018-08-16 NOTE — Telephone Encounter (Signed)
HFU scheduled today at 11:00 AM. -MM

## 2018-08-16 NOTE — Progress Notes (Signed)
Subjective:    Patient ID: Kyle Parker, male    DOB: Feb 28, 1956, 63 y.o.   MRN: [id number]  Kyle Parker is a 63 y.o. male presenting on 08/16/2018 for Hospitalization Follow-up (Pneumonia Admitted  08/05/2018- 08/12/2018 [facility] )  Patient accompanied by wife, Amy, who provides additional history.  HPI  HOSPITAL FOLLOW-UP VISIT  Hospital/Location: [facility] Date of Admission: 08/05/18 Date of Discharge: 08/12/18 Transitions of care telephone call: Attempted x 2 on 08/14/18 - did not reach patient, left voice mail  Reason for Admission: Fever, [place] Vomiting / Pneumonia Primary (+Secondary) Diagnosis: Community Acquired Pneumonia of Right Lung, Septic Shock, Acute respiratory failure requiring intubation,  - Hospital H&P and Discharge Summary have been reviewed - Patient presents today 4 days  after recent hospitalization. Brief summary of recent course, patient had symptoms of acute fever, chills, nausea vomiting, fatigue and had some respiratory symptoms and cough for 24-48 hours, hospitalized and admitted to ICU with septic shock, he required intubation 08/06/18 with progressive respiratory failure due to pneumonia, he was treated with IV antibiotics treated with transition to oral Levaquin antibiotic on discharge..  - Today reports overall has done well after discharge. He has shown dramatic improvement after extubation in past 7 days, he states his breathing is not 100% but slowly improving each day, he has more energy and more tolerance of activity now. He still gets winded with some activity.  Denies any chest pain, edema, syncope or near syncope, nausea vomiting chills or fever  - New medications on discharge: Levaquin 750mg  every other day for 4 days, last dose 08/15/18  He has already received Pneumonia vaccinations in past, last Pneumovax-23 on 07/10/18 done by [facility].  He has not had Flu vaccine this season, he will return in 1 week to receive this.  Additional concern - BPH /  Nocturia - He has known prior history BPH. Has been on flomax before, then switched to Rapaflo generic and then brand name Rapaflo now on 4mg , had helped more in past, seems to be less effective. He wakes up often overnight w/ nocturia. Never seen Urologist  I have reviewed the discharge medication list, and have reconciled the current and discharge medications today.   Current Outpatient Medications:  .  acyclovir (ZOVIRAX) 400 MG tablet, Take 1 tablet (400 mg total) by mouth 2 (two) times daily., Disp: 60 tablet, Rfl: 3 .  aspirin EC 81 MG tablet, Take 162 mg by mouth daily. , Disp: , Rfl:  .  gabapentin (NEURONTIN) 300 MG capsule, Take by mouth., Disp: , Rfl:  .  omeprazole (PRILOSEC) 20 MG capsule, Take 20 mg by mouth daily., Disp: , Rfl:  .  Vitamin D, Ergocalciferol, (DRISDOL) 50000 units CAPS capsule, Take 50,000 Units by mouth every Tuesday. , Disp: , Rfl:  .  RAPAFLO 8 MG CAPS capsule, Take 1 capsule (8 mg total) by mouth daily with breakfast., Disp: 30 capsule, Rfl: 5 .  REVLIMID 10 MG capsule, Take 10 mg by mouth daily. , Disp: , Rfl:   ------------------------------------------------------------------------- Social History   Tobacco Use  . Smoking status: Never Smoker  . Smokeless tobacco: Current User    Types: Chew  Substance Use Topics  . Alcohol use: No    Alcohol/week: 0.0 standard drinks  . Drug use: No    Review of Systems Per HPI unless specifically indicated above     Objective:    BP 95/61   Pulse 73   Temp 98.1 F (36.7 C)  Resp 16   Ht 6' (1.829 m)   Wt 265 lb (120.2 kg)   SpO2 100%   BMI 35.94 kg/m   Wt Readings from Last 3 Encounters:  08/16/18 265 lb (120.2 kg)  08/12/18 272 lb 11.3 oz (123.7 kg)  04/11/18 280 lb (127 kg)    Physical Exam Vitals signs and nursing note reviewed.  Constitutional:      General: He is not in acute distress.    Appearance: He is well-developed. He is not diaphoretic.     Comments: Currently fairly well  appearing slightly tired still, comfortable, cooperative  HENT:     Head: Normocephalic and atraumatic.  Eyes:     General:        Right eye: No discharge.        Left eye: No discharge.     Conjunctiva/sclera: Conjunctivae normal.  Neck:     Musculoskeletal: Normal range of motion and neck supple.     Thyroid: No thyromegaly.  Cardiovascular:     Rate and Rhythm: Normal rate and regular rhythm.     Heart sounds: Normal heart sounds. No murmur.  Pulmonary:     Effort: Pulmonary effort is normal. No respiratory distress.     Breath sounds: Normal breath sounds. No wheezing or rales.     Comments: Good air movement. Very mild reduced air movement bilateral lung bases. No focal crackles. No wheezing. Musculoskeletal: Normal range of motion.  Lymphadenopathy:     Cervical: No cervical adenopathy.  Skin:    General: Skin is warm and dry.     Findings: No erythema or rash.  Neurological:     Mental Status: He is alert and oriented to person, place, and time.  Psychiatric:        Behavior: Behavior normal.     Comments: Well groomed, good eye contact, normal speech and thoughts      I have personally reviewed the radiology report from 07/2018 through 08/2018.  CLINICAL DATA:  Dyspnea  EXAM: CHEST - 2 VIEW  COMPARISON:  None.  FINDINGS: Normal heart size. Normal mediastinal contour. No pneumothorax. No pleural effusion. Patchy right middle and lower lobe consolidation. No pulmonary edema.  IMPRESSION: Patchy right middle and right lower lobe consolidation, probably multilobar pneumonia. Recommend follow-up PA and lateral post treatment chest radiographs in 4-6 weeks.   Electronically Signed   By: Ilona Sorrel M.D.   On: 08/05/2018 21:31   -------  CLINICAL DATA:  Ventilator dependent.  EXAM: PORTABLE CHEST 1 VIEW  COMPARISON:  08/07/2018  FINDINGS: Endotracheal tube with the tip 2.9 cm above the carina. Nasogastric tube coursing below the  diaphragm.  Bibasilar airspace disease likely reflecting atelectasis. Relatively low lung volumes. No pleural effusion or pneumothorax. Stable cardiomediastinal silhouette. No aggressive osseous lesion.  IMPRESSION: 1. Endotracheal tube with the tip 2.9 cm above the carina. 2. Bibasilar atelectasis.  Low lung volumes.   Electronically Signed   By: Kathreen Devoid   On: 08/08/2018 10:32  Results for orders placed or performed during the hospital encounter of 08/05/18  Respiratory Panel by PCR  Result Value Ref Range   Adenovirus NOT DETECTED NOT DETECTED   Coronavirus 229E NOT DETECTED NOT DETECTED   Coronavirus HKU1 DETECTED (A) NOT DETECTED   Coronavirus NL63 NOT DETECTED NOT DETECTED   Coronavirus OC43 NOT DETECTED NOT DETECTED   Metapneumovirus NOT DETECTED NOT DETECTED   Rhinovirus / Enterovirus NOT DETECTED NOT DETECTED   Influenza A NOT DETECTED NOT DETECTED  Influenza B NOT DETECTED NOT DETECTED   Parainfluenza Virus 1 NOT DETECTED NOT DETECTED   Parainfluenza Virus 2 NOT DETECTED NOT DETECTED   Parainfluenza Virus 3 NOT DETECTED NOT DETECTED   Parainfluenza Virus 4 NOT DETECTED NOT DETECTED   Respiratory Syncytial Virus NOT DETECTED NOT DETECTED   Bordetella pertussis NOT DETECTED NOT DETECTED   Chlamydophila pneumoniae NOT DETECTED NOT DETECTED   Mycoplasma pneumoniae NOT DETECTED NOT DETECTED  Culture, blood (routine x 2)  Result Value Ref Range   Specimen Description BLOOD BLOOD RIGHT HAND    Special Requests      BOTTLES DRAWN AEROBIC AND ANAEROBIC Blood Culture adequate volume   Culture      NO GROWTH 5 DAYS Performed at [facility], [address] [place], [place] [address]    Report Status 08/10/2018 FINAL   Culture, blood (routine x 2)  Result Value Ref Range   Specimen Description BLOOD RIGHT ANTECUBITAL    Special Requests      BOTTLES DRAWN AEROBIC AND ANAEROBIC Blood Culture results may not be optimal due to an excessive volume of  blood received in culture bottles   Culture      NO GROWTH 5 DAYS Performed at [facility], [address] [place], [place] [address]    Report Status 08/10/2018 FINAL   Urine culture  Result Value Ref Range   Specimen Description      URINE, RANDOM Performed at [facility], [address], [place], [place] [address]    Special Requests      NONE Performed at [facility], [address], [place], [place] [address]    Culture      NO GROWTH Performed at [facility], [place] [address], [place], [place] [address]    Report Status 08/07/2018 FINAL   MRSA PCR Screening  Result Value Ref Range   MRSA by PCR NEGATIVE NEGATIVE  Culture, respiratory (non-expectorated)  Result Value Ref Range   Specimen Description      BRONCHIAL ALVEOLAR LAVAGE Performed at [facility], [address] [place], [place] [address]    Special Requests      Immunocompromised Performed at [facility], [place], [place] [address]    Gram Stain      MODERATE WBC PRESENT, PREDOMINANTLY PMN RARE GRAM POSITIVE COCCI    Culture      FEW STREPTOCOCCUS VESTIBULARIS FEW LACTOBACILLUS SPECIES NO STREPTOCOCCUS PNEUMONIAE ISOLATED PER PHYSICIAN REQUEST Performed at [facility], [place] [address] [place], [place] [address]    Report Status 08/10/2018 FINAL   Culture, fungus without smear  Result Value Ref Range   Specimen Description      BRONCHIAL ALVEOLAR LAVAGE Performed at [facility], [address] [place], [place] [address]    Special Requests      Immunocompromised Performed at [facility], [place],  [address]    Culture CANDIDA TROPICALIS (A)    Report Status PENDING   Comprehensive metabolic panel  Result Value Ref Range   Sodium 135 135 - 145 mmol/L   Potassium 4.0 3.5 - 5.1 mmol/L   Chloride 103 98 - 111 mmol/L   CO2 20 (L) 22 - 32 mmol/L   Glucose, Bld 140 (H)  70 - 99 mg/dL   BUN 36 (H) 8 - 23 mg/dL   Creatinine, Ser 3.99 (H) 0.61 - 1.24 mg/dL   Calcium 8.4 (L) 8.9 - 10.3  mg/dL   Total Protein 7.4 6.5 - 8.1 g/dL   Albumin 3.5 3.5 - 5.0 g/dL   AST 20 15 - 41 U/L   ALT 19 0 - 44 U/L   Alkaline Phosphatase 28 (L) 38 - 126 U/L   Total Bilirubin 1.6 (H) 0.3 - 1.2 mg/dL   GFR calc non Af Amer 15 (L) >60 mL/min   GFR calc Af Amer 18 (L) >60 mL/min   Anion gap 12 5 - 15  CBC with Differential  Result Value Ref Range   WBC 1.0 (LL) 4.0 - 10.5 K/uL   RBC 3.92 (L) 4.22 - 5.81 MIL/uL   Hemoglobin 12.6 (L) 13.0 - 17.0 g/dL   HCT 37.6 (L) 39.0 - 52.0 %   MCV 95.9 80.0 - 100.0 fL   MCH 32.1 26.0 - 34.0 pg   MCHC 33.5 30.0 - 36.0 g/dL   RDW 14.6 11.5 - 15.5 %   Platelets 92 (L) 150 - 400 K/uL   nRBC 0.0 0.0 - 0.2 %   Neutrophils Relative % 34 %   Neutro Abs 0.4 (L) 1.7 - 7.7 K/uL   Lymphocytes Relative 47 %   Lymphs Abs 0.5 (L) 0.7 - 4.0 K/uL   Monocytes Relative 14 %   Monocytes Absolute 0.2 0.1 - 1.0 K/uL   Eosinophils Relative 2 %   Eosinophils Absolute 0.0 0.0 - 0.5 K/uL   Basophils Relative 1 %   Basophils Absolute 0.0 0.0 - 0.1 K/uL   WBC Morphology MORPHOLOGY UNREMARKABLE    Smear Review Normal platelet morphology    Immature Granulocytes 2 %   Abs Immature Granulocytes 0.02 0.00 - 0.07 K/uL   Schistocytes PRESENT   Brain natriuretic peptide  Result Value Ref Range   B Natriuretic Peptide 478.0 (H) 0.0 - 100.0 pg/mL  Influenza panel by PCR (type A & B)  Result Value Ref Range   Influenza A By PCR NEGATIVE NEGATIVE   Influenza B By PCR NEGATIVE NEGATIVE  Lactic acid, plasma  Result Value Ref Range   Lactic Acid, Venous 3.8 (HH) 0.5 - 1.9 mmol/L  Lactic acid, plasma  Result Value Ref Range   Lactic Acid, Venous 3.8 (HH) 0.5 - 1.9 mmol/L  Pathologist smear review  Result Value Ref Range   Path Review Peripheral blood smear is reviewed.   Troponin I - Add-On to previous collection  Result Value Ref Range   Troponin I <0.03  <0.03 ng/mL  Protime-INR  Result Value Ref Range   Prothrombin Time 18.2 (H) 11.4 - 15.2 seconds   INR 1.53   HIV antibody (Routine Testing)  Result Value Ref Range   HIV Screen 4th Generation wRfx Non Reactive Non Reactive  Basic metabolic panel  Result Value Ref Range   Sodium 138 135 - 145 mmol/L   Potassium 4.2 3.5 - 5.1 mmol/L   Chloride 108 98 - 111 mmol/L   CO2 17 (L) 22 - 32 mmol/L   Glucose, Bld 129 (H) 70 - 99 mg/dL   BUN 37 (H) 8 - 23 mg/dL   Creatinine, Ser 3.40 (H) 0.61 - 1.24 mg/dL   Calcium 7.1 (L) 8.9 - 10.3 mg/dL   GFR calc non Af Amer 18 (L) >60 mL/min   GFR calc Af Amer 21 (L) >60 mL/min   Anion gap 13 5 - 15  CBC  Result Value Ref Range   WBC 0.4 (LL) 4.0 - 10.5 K/uL   RBC 3.55 (L) 4.22 - 5.81 MIL/uL  Hemoglobin 11.3 (L) 13.0 - 17.0 g/dL   HCT 34.7 (L) 39.0 - 52.0 %   MCV 97.7 80.0 - 100.0 fL   MCH 31.8 26.0 - 34.0 pg   MCHC 32.6 30.0 - 36.0 g/dL   RDW 14.6 11.5 - 15.5 %   Platelets 67 (L) 150 - 400 K/uL   nRBC 0.0 0.0 - 0.2 %  Procalcitonin - Baseline  Result Value Ref Range   Procalcitonin 110.44 ng/mL  Glucose, capillary  Result Value Ref Range   Glucose-Capillary 118 (H) 70 - 99 mg/dL   Comment 1 Notify RN    Comment 2 Document in Chart   Lactic acid, plasma  Result Value Ref Range   Lactic Acid, Venous 3.2 (HH) 0.5 - 1.9 mmol/L  Lactic acid, plasma  Result Value Ref Range   Lactic Acid, Venous 3.1 (HH) 0.5 - 1.9 mmol/L  Blood gas, arterial  Result Value Ref Range   FIO2 0.30    Delivery systems BILEVEL POSITIVE AIRWAY PRESSURE    Inspiratory PAP 10    Expiratory PAP 5    pH, Arterial 7.41 7.350 - 7.450   pCO2 arterial 26 (L) 32.0 - 48.0 mmHg   pO2, Arterial 65 (L) 83.0 - 108.0 mmHg   Bicarbonate 16.5 (L) 20.0 - 28.0 mmol/L   Acid-base deficit 6.5 (H) 0.0 - 2.0 mmol/L   O2 Saturation 92.6 %   Patient temperature 37.0    Collection site RIGHT BRACHIAL    Sample type ARTERIAL DRAW    Allens test (pass/fail) PASS PASS  Triglycerides   Result Value Ref Range   Triglycerides 76 <150 mg/dL  Blood gas, arterial  Result Value Ref Range   FIO2 0.60    Delivery systems VENTILATOR    Mode PRESSURE REGULATED VOLUME CONTROL    VT 500 mL   LHR 16 resp/min   Peep/cpap 5.0 cm H20   pH, Arterial 7.24 (L) 7.350 - 7.450   pCO2 arterial 44 32.0 - 48.0 mmHg   pO2, Arterial 76 (L) 83.0 - 108.0 mmHg   Bicarbonate 18.9 (L) 20.0 - 28.0 mmol/L   Acid-base deficit 8.3 (H) 0.0 - 2.0 mmol/L   O2 Saturation 92.3 %   Patient temperature 37.0    Collection site RIGHT RADIAL    Sample type ARTERIAL DRAW    Allens test (pass/fail) POSITIVE (A) PASS  Glucose, capillary  Result Value Ref Range   Glucose-Capillary 98 70 - 99 mg/dL  Legionella Pneumophila Serogp 1 Ur Ag  Result Value Ref Range   L. pneumophila Serogp 1 Ur Ag Negative Negative   Source of Sample URINE, RANDOM   Strep pneumoniae urinary antigen  Result Value Ref Range   Strep Pneumo Urinary Antigen POSITIVE (A) NEGATIVE  Fungitell, Serum  Result Value Ref Range   Fungitell Result <31 <80 pg/mL  Fungal antibodies panel, id-blood  Result Value Ref Range   Aspergillus fumigatus, IgG Negative Neg:<1:1   Aspergillus flavus Negative Neg:<1:1   Aspergillus [place] Negative Neg:<1:1   Blastomyces Abs, Qn, DID Negative Neg:<1:1   Histoplasma Ab, Immunodiffusion Negative Neg:<1:1  Blood gas, arterial  Result Value Ref Range   FIO2 0.30    Delivery systems VENTILATOR    Mode PRESSURE REGULATED VOLUME CONTROL    VT 500 mL   LHR 14 resp/min   Peep/cpap 5.0 cm H20   pH, Arterial 7.35 7.350 - 7.450   pCO2 arterial 42 32.0 - 48.0 mmHg   pO2, Arterial 83 83.0 - 108.0 mmHg  Bicarbonate 23.2 20.0 - 28.0 mmol/L   Acid-base deficit 2.4 (H) 0.0 - 2.0 mmol/L   O2 Saturation 95.6 %   Patient temperature 37.0    Collection site LEFT RADIAL    Sample type ARTERIAL DRAW    Allens test (pass/fail) PASS PASS  Glucose, capillary  Result Value Ref Range   Glucose-Capillary 86 70 - 99  mg/dL  Basic metabolic panel  Result Value Ref Range   Sodium 138 135 - 145 mmol/L   Potassium 3.7 3.5 - 5.1 mmol/L   Chloride 108 98 - 111 mmol/L   CO2 21 (L) 22 - 32 mmol/L   Glucose, Bld 114 (H) 70 - 99 mg/dL   BUN 44 (H) 8 - 23 mg/dL   Creatinine, Ser 2.49 (H) 0.61 - 1.24 mg/dL   Calcium 7.0 (L) 8.9 - 10.3 mg/dL   GFR calc non Af Amer 27 (L) >60 mL/min   GFR calc Af Amer 31 (L) >60 mL/min   Anion gap 9 5 - 15  Lactic acid, plasma  Result Value Ref Range   Lactic Acid, Venous 1.8 0.5 - 1.9 mmol/L  Glucose, capillary  Result Value Ref Range   Glucose-Capillary 91 70 - 99 mg/dL  Glucose, capillary  Result Value Ref Range   Glucose-Capillary 102 (H) 70 - 99 mg/dL  Basic metabolic panel  Result Value Ref Range   Sodium 138 135 - 145 mmol/L   Potassium 3.3 (L) 3.5 - 5.1 mmol/L   Chloride 107 98 - 111 mmol/L   CO2 23 22 - 32 mmol/L   Glucose, Bld 119 (H) 70 - 99 mg/dL   BUN 45 (H) 8 - 23 mg/dL   Creatinine, Ser 2.33 (H) 0.61 - 1.24 mg/dL   Calcium 7.0 (L) 8.9 - 10.3 mg/dL   GFR calc non Af Amer 29 (L) >60 mL/min   GFR calc Af Amer 34 (L) >60 mL/min   Anion gap 8 5 - 15  CBC with Differential/Platelet  Result Value Ref Range   WBC 0.8 (LL) 4.0 - 10.5 K/uL   RBC 3.23 (L) 4.22 - 5.81 MIL/uL   Hemoglobin 10.3 (L) 13.0 - 17.0 g/dL   HCT 32.2 (L) 39.0 - 52.0 %   MCV 99.7 80.0 - 100.0 fL   MCH 31.9 26.0 - 34.0 pg   MCHC 32.0 30.0 - 36.0 g/dL   RDW 14.8 11.5 - 15.5 %   Platelets 60 (L) 150 - 400 K/uL   nRBC 0.0 0.0 - 0.2 %   Neutrophils Relative % 65 %   Neutro Abs 0.5 (L) 1.7 - 7.7 K/uL   Lymphocytes Relative 21 %   Lymphs Abs 0.2 (L) 0.7 - 4.0 K/uL   Monocytes Relative 9 %   Monocytes Absolute 0.1 0.1 - 1.0 K/uL   Eosinophils Relative 3 %   Eosinophils Absolute 0.0 0.0 - 0.5 K/uL   Basophils Relative 1 %   Basophils Absolute 0.0 0.0 - 0.1 K/uL   WBC Morphology MORPHOLOGY UNREMARKABLE    RBC Morphology MORPHOLOGY UNREMARKABLE    Smear Review PLATELET CLUMPS NOTED ON  SMEAR, UNABLE TO ESTIMATE    Immature Granulocytes 1 %   Abs Immature Granulocytes 0.01 0.00 - 0.07 K/uL  Procalcitonin  Result Value Ref Range   Procalcitonin 65.33 ng/mL  Basic metabolic panel  Result Value Ref Range   Sodium 140 135 - 145 mmol/L   Potassium 3.6 3.5 - 5.1 mmol/L   Chloride 109 98 - 111 mmol/L   CO2  24 22 - 32 mmol/L   Glucose, Bld 101 (H) 70 - 99 mg/dL   BUN 44 (H) 8 - 23 mg/dL   Creatinine, Ser 2.24 (H) 0.61 - 1.24 mg/dL   Calcium 6.9 (L) 8.9 - 10.3 mg/dL   GFR calc non Af Amer 31 (L) >60 mL/min   GFR calc Af Amer 35 (L) >60 mL/min   Anion gap 7 5 - 15  Glucose, capillary  Result Value Ref Range   Glucose-Capillary 106 (H) 70 - 99 mg/dL  Albumin  Result Value Ref Range   Albumin 2.5 (L) 3.5 - 5.0 g/dL  Glucose, capillary  Result Value Ref Range   Glucose-Capillary 92 70 - 99 mg/dL  Glucose, capillary  Result Value Ref Range   Glucose-Capillary 86 70 - 99 mg/dL  Procalcitonin  Result Value Ref Range   Procalcitonin 47.06 ng/mL  CBC with Differential/Platelet  Result Value Ref Range   WBC 1.5 (L) 4.0 - 10.5 K/uL   RBC 3.02 (L) 4.22 - 5.81 MIL/uL   Hemoglobin 9.5 (L) 13.0 - 17.0 g/dL   HCT 29.8 (L) 39.0 - 52.0 %   MCV 98.7 80.0 - 100.0 fL   MCH 31.5 26.0 - 34.0 pg   MCHC 31.9 30.0 - 36.0 g/dL   RDW 15.0 11.5 - 15.5 %   Platelets 46 (L) 150 - 400 K/uL   nRBC 0.0 0.0 - 0.2 %   Neutrophils Relative % 45 %   Neutro Abs 0.7 (L) 1.7 - 7.7 K/uL   Lymphocytes Relative 21 %   Lymphs Abs 0.3 (L) 0.7 - 4.0 K/uL   Monocytes Relative 23 %   Monocytes Absolute 0.4 0.1 - 1.0 K/uL   Eosinophils Relative 7 %   Eosinophils Absolute 0.1 0.0 - 0.5 K/uL   Basophils Relative 1 %   Basophils Absolute 0.0 0.0 - 0.1 K/uL   WBC Morphology MORPHOLOGY UNREMARKABLE    RBC Morphology MORPHOLOGY UNREMARKABLE    Smear Review Normal platelet morphology    Immature Granulocytes 3 %   Abs Immature Granulocytes 0.04 0.00 - 0.07 K/uL  Basic metabolic panel  Result Value Ref  Range   Sodium 140 135 - 145 mmol/L   Potassium 3.3 (L) 3.5 - 5.1 mmol/L   Chloride 110 98 - 111 mmol/L   CO2 23 22 - 32 mmol/L   Glucose, Bld 95 70 - 99 mg/dL   BUN 48 (H) 8 - 23 mg/dL   Creatinine, Ser 2.39 (H) 0.61 - 1.24 mg/dL   Calcium 7.2 (L) 8.9 - 10.3 mg/dL   GFR calc non Af Amer 28 (L) >60 mL/min   GFR calc Af Amer 33 (L) >60 mL/min   Anion gap 7 5 - 15  Magnesium  Result Value Ref Range   Magnesium 2.0 1.7 - 2.4 mg/dL  Phosphorus  Result Value Ref Range   Phosphorus 3.6 2.5 - 4.6 mg/dL  Glucose, capillary  Result Value Ref Range   Glucose-Capillary 85 70 - 99 mg/dL  Glucose, capillary  Result Value Ref Range   Glucose-Capillary 94 70 - 99 mg/dL  Glucose, capillary  Result Value Ref Range   Glucose-Capillary 81 70 - 99 mg/dL  Glucose, capillary  Result Value Ref Range   Glucose-Capillary 92 70 - 99 mg/dL  Glucose, capillary  Result Value Ref Range   Glucose-Capillary 94 70 - 99 mg/dL  Glucose, capillary  Result Value Ref Range   Glucose-Capillary 102 (H) 70 - 99 mg/dL  Basic metabolic panel  Result Value Ref Range   Sodium 146 (H) 135 - 145 mmol/L   Potassium 4.0 3.5 - 5.1 mmol/L   Chloride 116 (H) 98 - 111 mmol/L   CO2 25 22 - 32 mmol/L   Glucose, Bld 107 (H) 70 - 99 mg/dL   BUN 58 (H) 8 - 23 mg/dL   Creatinine, Ser 2.86 (H) 0.61 - 1.24 mg/dL   Calcium 8.4 (L) 8.9 - 10.3 mg/dL   GFR calc non Af Amer 23 (L) >60 mL/min   GFR calc Af Amer 26 (L) >60 mL/min   Anion gap 5 5 - 15  Magnesium  Result Value Ref Range   Magnesium 2.4 1.7 - 2.4 mg/dL  Phosphorus  Result Value Ref Range   Phosphorus 4.3 2.5 - 4.6 mg/dL  Glucose, capillary  Result Value Ref Range   Glucose-Capillary 89 70 - 99 mg/dL  Vancomycin, trough  Result Value Ref Range   Vancomycin Tr 12 (L) 15 - 20 ug/mL  Triglycerides  Result Value Ref Range   Triglycerides 258 (H) <150 mg/dL  Glucose, capillary  Result Value Ref Range   Glucose-Capillary 96 70 - 99 mg/dL  Glucose, capillary   Result Value Ref Range   Glucose-Capillary 97 70 - 99 mg/dL  Glucose, capillary  Result Value Ref Range   Glucose-Capillary 113 (H) 70 - 99 mg/dL  CBC with Differential  Result Value Ref Range   WBC 3.2 (L) 4.0 - 10.5 K/uL   RBC 2.94 (L) 4.22 - 5.81 MIL/uL   Hemoglobin 9.4 (L) 13.0 - 17.0 g/dL   HCT 29.6 (L) 39.0 - 52.0 %   MCV 100.7 (H) 80.0 - 100.0 fL   MCH 32.0 26.0 - 34.0 pg   MCHC 31.8 30.0 - 36.0 g/dL   RDW 15.4 11.5 - 15.5 %   Platelets PLATELET CLUMPS NOTED ON SMEAR, UNABLE TO ESTIMATE 150 - 400 K/uL   nRBC 0.0 0.0 - 0.2 %   Neutrophils Relative % 53 %   Neutro Abs 1.8 1.7 - 7.7 K/uL   Band Neutrophils 3 %   Lymphocytes Relative 20 %   Lymphs Abs 0.6 (L) 0.7 - 4.0 K/uL   Monocytes Relative 17 %   Monocytes Absolute 0.5 0.1 - 1.0 K/uL   Eosinophils Relative 5 %   Eosinophils Absolute 0.2 0.0 - 0.5 K/uL   Basophils Relative 1 %   Basophils Absolute 0.0 0.0 - 0.1 K/uL   Metamyelocytes Relative 1 %   Abs Immature Granulocytes 0.00 0.00 - 0.07 K/uL  Glucose, capillary  Result Value Ref Range   Glucose-Capillary 116 (H) 70 - 99 mg/dL  Glucose, capillary  Result Value Ref Range   Glucose-Capillary 104 (H) 70 - 99 mg/dL  CBC with Differential  Result Value Ref Range   WBC 4.9 4.0 - 10.5 K/uL   RBC 3.25 (L) 4.22 - 5.81 MIL/uL   Hemoglobin 10.2 (L) 13.0 - 17.0 g/dL   HCT 32.1 (L) 39.0 - 52.0 %   MCV 98.8 80.0 - 100.0 fL   MCH 31.4 26.0 - 34.0 pg   MCHC 31.8 30.0 - 36.0 g/dL   RDW 15.3 11.5 - 15.5 %   Platelets PLATELET CLUMPS NOTED ON SMEAR, UNABLE TO ESTIMATE 150 - 400 K/uL   nRBC 0.0 0.0 - 0.2 %   Neutrophils Relative % 65 %   Neutro Abs 3.2 1.7 - 7.7 K/uL   Lymphocytes Relative 12 %   Lymphs Abs 0.6 (L) 0.7 - 4.0 K/uL  Monocytes Relative 20 %   Monocytes Absolute 1.0 0.1 - 1.0 K/uL   Eosinophils Relative 1 %   Eosinophils Absolute 0.1 0.0 - 0.5 K/uL   Basophils Relative 1 %   Basophils Absolute 0.1 0.0 - 0.1 K/uL   WBC Morphology TOXIC GRANULATION     Smear Review PLATELET CLUMPS NOTED ON SMEAR, UNABLE TO ESTIMATE    Immature Granulocytes 1 %   Abs Immature Granulocytes 0.06 0.00 - 0.07 K/uL  Basic metabolic panel  Result Value Ref Range   Sodium 150 (H) 135 - 145 mmol/L   Potassium 3.9 3.5 - 5.1 mmol/L   Chloride 121 (H) 98 - 111 mmol/L   CO2 23 22 - 32 mmol/L   Glucose, Bld 109 (H) 70 - 99 mg/dL   BUN 68 (H) 8 - 23 mg/dL   Creatinine, Ser 2.61 (H) 0.61 - 1.24 mg/dL   Calcium 8.4 (L) 8.9 - 10.3 mg/dL   GFR calc non Af Amer 25 (L) >60 mL/min   GFR calc Af Amer 29 (L) >60 mL/min   Anion gap 6 5 - 15  Glucose, capillary  Result Value Ref Range   Glucose-Capillary 94 70 - 99 mg/dL  Glucose, capillary  Result Value Ref Range   Glucose-Capillary 93 70 - 99 mg/dL  Glucose, capillary  Result Value Ref Range   Glucose-Capillary 99 70 - 99 mg/dL  Glucose, capillary  Result Value Ref Range   Glucose-Capillary 100 (H) 70 - 99 mg/dL  Glucose, capillary  Result Value Ref Range   Glucose-Capillary 113 (H) 70 - 99 mg/dL  CBC with Differential  Result Value Ref Range   WBC 4.3 4.0 - 10.5 K/uL   RBC 3.21 (L) 4.22 - 5.81 MIL/uL   Hemoglobin 10.3 (L) 13.0 - 17.0 g/dL   HCT 32.3 (L) 39.0 - 52.0 %   MCV 100.6 (H) 80.0 - 100.0 fL   MCH 32.1 26.0 - 34.0 pg   MCHC 31.9 30.0 - 36.0 g/dL   RDW 15.2 11.5 - 15.5 %   Platelets 86 (L) 150 - 400 K/uL   nRBC 0.0 0.0 - 0.2 %   Neutrophils Relative % 65 %   Neutro Abs 2.8 1.7 - 7.7 K/uL   Lymphocytes Relative 18 %   Lymphs Abs 0.8 0.7 - 4.0 K/uL   Monocytes Relative 13 %   Monocytes Absolute 0.6 0.1 - 1.0 K/uL   Eosinophils Relative 2 %   Eosinophils Absolute 0.1 0.0 - 0.5 K/uL   Basophils Relative 1 %   Basophils Absolute 0.1 0.0 - 0.1 K/uL   WBC Morphology MORPHOLOGY UNREMARKABLE    RBC Morphology MORPHOLOGY UNREMARKABLE    Immature Granulocytes 1 %   Abs Immature Granulocytes 0.04 0.00 - 0.07 K/uL  Basic metabolic panel  Result Value Ref Range   Sodium 145 135 - 145 mmol/L   Potassium  4.4 3.5 - 5.1 mmol/L   Chloride 116 (H) 98 - 111 mmol/L   CO2 21 (L) 22 - 32 mmol/L   Glucose, Bld 109 (H) 70 - 99 mg/dL   BUN 75 (H) 8 - 23 mg/dL   Creatinine, Ser 2.42 (H) 0.61 - 1.24 mg/dL   Calcium 8.3 (L) 8.9 - 10.3 mg/dL   GFR calc non Af Amer 28 (L) >60 mL/min   GFR calc Af Amer 32 (L) >60 mL/min   Anion gap 8 5 - 15  Glucose, capillary  Result Value Ref Range   Glucose-Capillary 145 (H) 70 - 99 mg/dL  CBC with Differential  Result Value Ref Range   WBC 3.6 (L) 4.0 - 10.5 K/uL   RBC 3.05 (L) 4.22 - 5.81 MIL/uL   Hemoglobin 9.7 (L) 13.0 - 17.0 g/dL   HCT 30.2 (L) 39.0 - 52.0 %   MCV 99.0 80.0 - 100.0 fL   MCH 31.8 26.0 - 34.0 pg   MCHC 32.1 30.0 - 36.0 g/dL   RDW 14.7 11.5 - 15.5 %   Platelets 88 (L) 150 - 400 K/uL   nRBC 0.0 0.0 - 0.2 %   Neutrophils Relative % 61 %   Neutro Abs 2.2 1.7 - 7.7 K/uL   Lymphocytes Relative 20 %   Lymphs Abs 0.7 0.7 - 4.0 K/uL   Monocytes Relative 15 %   Monocytes Absolute 0.5 0.1 - 1.0 K/uL   Eosinophils Relative 2 %   Eosinophils Absolute 0.1 0.0 - 0.5 K/uL   Basophils Relative 1 %   Basophils Absolute 0.0 0.0 - 0.1 K/uL   Immature Granulocytes 1 %   Abs Immature Granulocytes 0.04 0.00 - 0.07 K/uL      Assessment & Plan:   Problem List Items Addressed This Visit    BPH with obstruction/lower urinary tract symptoms Clinically with BPH suboptimal control w/ persistent LUTS, nocturia Increase dose of Rapaflo from 4mg  up to 8mg  daily Future follow-up if not improved we can refer to Urologist    Relevant Medications   RAPAFLO 8 MG CAPS capsule    Other Visit Diagnoses    Pneumonia of right lower lobe due to infectious organism ([place])    -  Primary - IMPROVED   History of septic shock   - RESLVED   History of respiratory failure - RESOLVED         Clinically dramatic improvement within past week s/p CAP R sided w/ acute respiratory failure requiring intubation and ICU therapy. Since extubated 1 week ago doing well, improved  respiratory status and activity tolerance. No additional O2 - Completed Levaquin PO course on discharge - Remains afebrile, O2 100% on room air   Plan Reassurance - no extended antibiotics. Continue incentive spirometer Gradually increase activity Follow-up as planned - if recurrent pneumonia in future, consider 2nd opinion Pulm consult Return criteria reviewed if not improving   Meds ordered this encounter  Medications  . RAPAFLO 8 MG CAPS capsule    Sig: Take 1 capsule (8 mg total) by mouth daily with breakfast.    Dispense:  30 capsule    Refill:  5    Dose change, from 4 up to 8    Follow up plan: Return in about 4 weeks (around 09/13/2018) for 4 week follow-up BPH, history of pneumonia.   Nobie Putnam, [facility] 08/16/2018, 11:16 AM

## 2018-08-19 DIAGNOSIS — N401 Enlarged prostate with lower urinary tract symptoms: Principal | ICD-10-CM

## 2018-08-19 DIAGNOSIS — N138 Other obstructive and reflux uropathy: Secondary | ICD-10-CM

## 2018-08-19 MED ORDER — TAMSULOSIN HCL 0.4 MG PO CAPS: 0.8000 mg | ORAL_CAPSULE | Freq: Every day | ORAL | 2 refills | Status: DC

## 2018-08-22 DIAGNOSIS — R197 Diarrhea, unspecified: Secondary | ICD-10-CM | POA: Diagnosis not present

## 2018-08-22 DIAGNOSIS — C9000 Multiple myeloma not having achieved remission: Secondary | ICD-10-CM | POA: Diagnosis not present

## 2018-08-22 DIAGNOSIS — R0602 Shortness of breath: Secondary | ICD-10-CM | POA: Diagnosis not present

## 2018-08-22 DIAGNOSIS — C9001 Multiple myeloma in remission: Secondary | ICD-10-CM | POA: Diagnosis not present

## 2018-08-23 ENCOUNTER — Ambulatory Visit (INDEPENDENT_AMBULATORY_CARE_PROVIDER_SITE_OTHER): Payer: BLUE CROSS/BLUE SHIELD

## 2018-08-23 DIAGNOSIS — Z23 Encounter for immunization: Secondary | ICD-10-CM | POA: Diagnosis not present

## 2018-08-27 DIAGNOSIS — C9001 Multiple myeloma in remission: Secondary | ICD-10-CM | POA: Diagnosis not present

## 2018-08-27 DIAGNOSIS — R0602 Shortness of breath: Secondary | ICD-10-CM | POA: Diagnosis not present

## 2018-08-27 LAB — CULTURE, FUNGUS WITHOUT SMEAR

## 2018-09-10 ENCOUNTER — Other Ambulatory Visit: Payer: Self-pay | Admitting: Family Medicine

## 2018-09-10 DIAGNOSIS — N179 Acute kidney failure, unspecified: Secondary | ICD-10-CM | POA: Diagnosis not present

## 2018-09-10 DIAGNOSIS — C9000 Multiple myeloma not having achieved remission: Secondary | ICD-10-CM | POA: Diagnosis not present

## 2018-09-10 DIAGNOSIS — R200 Anesthesia of skin: Secondary | ICD-10-CM | POA: Diagnosis not present

## 2018-09-10 DIAGNOSIS — G629 Polyneuropathy, unspecified: Secondary | ICD-10-CM | POA: Diagnosis not present

## 2018-09-10 DIAGNOSIS — N138 Other obstructive and reflux uropathy: Secondary | ICD-10-CM

## 2018-09-10 DIAGNOSIS — R0781 Pleurodynia: Secondary | ICD-10-CM | POA: Diagnosis not present

## 2018-09-10 DIAGNOSIS — N401 Enlarged prostate with lower urinary tract symptoms: Principal | ICD-10-CM

## 2018-09-10 DIAGNOSIS — C9001 Multiple myeloma in remission: Secondary | ICD-10-CM | POA: Diagnosis not present

## 2018-09-10 DIAGNOSIS — R5383 Other fatigue: Secondary | ICD-10-CM | POA: Diagnosis not present

## 2018-09-10 DIAGNOSIS — Z79899 Other long term (current) drug therapy: Secondary | ICD-10-CM | POA: Diagnosis not present

## 2018-09-10 DIAGNOSIS — D6489 Other specified anemias: Secondary | ICD-10-CM | POA: Diagnosis not present

## 2018-09-10 DIAGNOSIS — R05 Cough: Secondary | ICD-10-CM | POA: Diagnosis not present

## 2018-09-10 DIAGNOSIS — M542 Cervicalgia: Secondary | ICD-10-CM | POA: Diagnosis not present

## 2018-09-10 DIAGNOSIS — R55 Syncope and collapse: Secondary | ICD-10-CM | POA: Diagnosis not present

## 2018-09-10 DIAGNOSIS — K029 Dental caries, unspecified: Secondary | ICD-10-CM | POA: Diagnosis not present

## 2018-09-10 DIAGNOSIS — R0981 Nasal congestion: Secondary | ICD-10-CM | POA: Diagnosis not present

## 2018-09-13 NOTE — Addendum Note (Signed)
Addended by: Olin Hauser on: 09/13/2018 01:05 PM   Modules accepted: Level of Service

## 2018-10-08 DIAGNOSIS — C9001 Multiple myeloma in remission: Secondary | ICD-10-CM | POA: Diagnosis not present

## 2018-10-09 DIAGNOSIS — C44629 Squamous cell carcinoma of skin of left upper limb, including shoulder: Secondary | ICD-10-CM | POA: Diagnosis not present

## 2018-10-11 ENCOUNTER — Encounter: Payer: Self-pay | Admitting: Family Medicine

## 2018-10-11 ENCOUNTER — Other Ambulatory Visit: Payer: Self-pay

## 2018-10-11 ENCOUNTER — Ambulatory Visit (INDEPENDENT_AMBULATORY_CARE_PROVIDER_SITE_OTHER): Payer: Medicare Other | Admitting: Family Medicine

## 2018-10-11 VITALS — BP 115/72 | HR 64 | Temp 98.2°F | Resp 16 | Ht 72.0 in | Wt 280.0 lb

## 2018-10-11 DIAGNOSIS — Z8701 Personal history of pneumonia (recurrent): Secondary | ICD-10-CM | POA: Diagnosis not present

## 2018-10-11 DIAGNOSIS — J019 Acute sinusitis, unspecified: Secondary | ICD-10-CM | POA: Diagnosis not present

## 2018-10-11 MED ORDER — IPRATROPIUM BROMIDE 0.06 % NA SOLN: 2.0000 | Freq: Four times a day (QID) | NASAL | 0 refills | Status: DC

## 2018-10-11 NOTE — Progress Notes (Signed)
Subjective:    Patient ID: Kyle Parker, male    DOB: [birth year], 63 y.o.   MRN: [id number]  Kyle Parker is a 63 y.o. male presenting on 10/11/2018 for Sinusitis (onset 5 days nasal congestion, claritin and mucinex was taken improved but wanted to be checked out since H/O of pneumonia)  Accompanied by wife, Amy, who provides additional history.  HPI   Acute Rinosinusitis / History of Community Acquired Pneumonia Followed by Oncology for Multiple Myeloma, recently seen earlier this week and had lab showed low neutrophil count at 700, and was advised to come off Revlimid temporarily for 1 week, and allow it to improve, to help fight off current respiratory infection. He has significant recent history of complicated Community Acquired Pneumonia back in December / January. See prior note with complicated course.  Today he is here to discuss sinus symptoms, for past >5 days onset of sinus drainage and congestion. Has taken OTC med claritin, mucinex with some relief and today does feel improved compared to past few days - No productive cough - Regarding oncology, multiple myeloma - He will return to [facility] on Wednesday to discuss restarting medication. - Regarding prior CAP course, he states breathing took a while to respond, he kept working on incentive spirometer and deep breathing exercises and it did help, one day he worked in yard with son and he had to breathe heavy a lot and next day his breathing was much improved - Denies any fevers chills sweats, chest pain or dyspnea, wheezing   Depression screen [facility] 2/9 10/11/2018 04/11/2018 02/14/2018  Decreased Interest 0 0 0  Down, Depressed, Hopeless 0 0 0  PHQ - 2 Score 0 0 0    Social History   Tobacco Use  . Smoking status: Never Smoker  . Smokeless tobacco: Current User    Types: Chew  Substance Use Topics  . Alcohol use: No    Alcohol/week: 0.0 standard drinks  . Drug use: No    Review of Systems Per HPI unless specifically indicated  above     Objective:    BP 115/72   Pulse 64   Temp 98.2 F (36.8 C) (Oral)   Resp 16   Ht 6' (1.829 m)   Wt 280 lb (127 kg)   SpO2 98%   BMI 37.97 kg/m   Wt Readings from Last 3 Encounters:  10/11/18 280 lb (127 kg)  08/16/18 265 lb (120.2 kg)  08/12/18 272 lb 11.3 oz (123.7 kg)    Physical Exam Vitals signs and nursing note reviewed.  Constitutional:      General: He is not in acute distress.    Appearance: He is well-developed. He is not diaphoretic.     Comments: Well-appearing, comfortable, cooperative  HENT:     Head: Normocephalic and atraumatic.     Ears:     Comments: Frontal / maxillary sinuses non-tender. Nares with mild turbinate edema and congestion without purulence.  Eyes:     General:        Right eye: No discharge.        Left eye: No discharge.     Conjunctiva/sclera: Conjunctivae normal.  Cardiovascular:     Rate and Rhythm: Normal rate.  Pulmonary:     Effort: Pulmonary effort is normal. No respiratory distress.     Breath sounds: Normal breath sounds. No wheezing, rhonchi or rales.     Comments: Improved air movement diffusely. No focal abnormal or wheezing. No cough Skin:  General: Skin is warm and dry.     Findings: No erythema or rash.  Neurological:     Mental Status: He is alert and oriented to person, place, and time.  Psychiatric:        Behavior: Behavior normal.     Comments: Well groomed, good eye contact, normal speech and thoughts        Assessment & Plan:   Problem List Items Addressed This Visit    None    Visit Diagnoses    Acute rhinosinusitis    -  Primary   Relevant Medications   ipratropium (ATROVENT) 0.06 % nasal spray   History of community acquired pneumonia       Relevant Orders   DG Chest 2 View      Consistent with acute rhinosinusitis, likely initially viral URI vs allergic rhinitis component with some improvement now without worsening concern for bacterial infection. - He has recent history of CAP  complicated hospitalization, has gradually improved, now seems resp status is improved, no sign of pneumonia or recurrence at this time Afebrile, normal lung sounds   Plan: 1. Reassurance, likely self-limited - no indication for antibiotics at this time 2. Start Atrovent nasal spray decongestant 2 sprays in each nostril up to 4 times daily for 7 days 3. Resume Loratadine (Claritin) 24m daily - -Continue Mucinex up to 1-2 weeks 4. Return criteria reviewed  Future order placed for Chest X-ray within 1 week if needed - he was advised this is a precaution that if worsening symptoms, or respiratory concerns he can come in walk in for x-ray and we will follow-up result with him, goal is to catch any potential pneumonia earlier, advised reassurance if significant symptoms should be seen or consider urgent care hospital ED if need   Meds ordered this encounter  Medications  . ipratropium (ATROVENT) 0.06 % nasal spray    Sig: Place 2 sprays into both nostrils 4 (four) times daily. For up to 5-7 days then stop.    Dispense:  15 mL    Refill:  0    Follow up plan: Return in about 4 weeks (around 11/08/2018) for Annual Physical.  Future labs will be ordered for 1 month.  A total of 25 minutes was spent face-to-face with this patient. Greater than 50% of this time (approximately 15 minutes) was spent in counseling on infection prevention and monitoring for signs of pneumonia, recent history of complicated CAP. Also coordination of care discussing follow-up plan if need for x-ray or imaging and return to Oncology as scheduled for next week.  ANobie Putnam [facility] 10/11/2018, 11:42 AM

## 2018-10-11 NOTE — Patient Instructions (Addendum)
Thank you for coming to the office today.  1. It sounds like you have persistent Sinus Congestion or "Rhinosinusitis" - I do not think that this is a Bacterial Sinus Infection. Usually these are caused by Viruses or Allergies, and will run it's course in about 7 to 10 days. - No antibiotics are needed - Resume Loratadine (Claritin) 10mg  daily - Start Atrovent nasal spray decongestant 2 sprays in each nostril up to 4 times daily for 7 days  - Improve hydration by drinking plenty of clear fluids (water, gatorade) to reduce secretions and thin congestion - Congestion draining down throat can cause irritation. May try warm herbal tea with honey, cough drops - Can take Tylenol or Ibuprofen as needed for fevers - May continue over the counter cold medicine as you are, I would not use any decongestant or mucinex longer than 14 days or 2 weeks approximately  IF you are not improving - or concern for pneumonia and want to check X-ray - then you can do a walk in any time for this.   If you develop persistent fever >101F for at least 3 consecutive days, headaches with sinus pain or pressure or persistent earache, please schedule a follow-up evaluation within next few days to week.  DUE for FASTING BLOOD WORK (no food or drink after midnight before the lab appointment, only water or coffee without cream/sugar on the morning of)  SCHEDULE "Lab Only" visit in the morning at the clinic for lab draw in 4 WEEKS   - Make sure Lab Only appointment is at about 1 week before your next appointment, so that results will be available  For Lab Results, once available within 2-3 days of blood draw, you can can log in to MyChart online to view your results and a brief explanation. Also, we can discuss results at next follow-up visit.   Please schedule a Follow-up Appointment to: Return in about 4 weeks (around 11/08/2018) for Annual Physical.  If you have any other questions or concerns, please feel free to call the  office or send a message through [place]. You may also schedule an earlier appointment if necessary.  Additionally, you may be receiving a survey about your experience at our office within a few days to 1 week by e-mail or mail. We value your feedback.  Nobie Putnam, DO [place]

## 2018-10-16 DIAGNOSIS — C9001 Multiple myeloma in remission: Secondary | ICD-10-CM | POA: Diagnosis not present

## 2018-10-22 DIAGNOSIS — C9000 Multiple myeloma not having achieved remission: Secondary | ICD-10-CM | POA: Diagnosis not present

## 2018-10-22 DIAGNOSIS — C9001 Multiple myeloma in remission: Secondary | ICD-10-CM | POA: Diagnosis not present

## 2018-10-24 ENCOUNTER — Other Ambulatory Visit: Payer: Self-pay | Admitting: Family Medicine

## 2018-10-24 DIAGNOSIS — N401 Enlarged prostate with lower urinary tract symptoms: Secondary | ICD-10-CM

## 2018-10-24 DIAGNOSIS — R7309 Other abnormal glucose: Secondary | ICD-10-CM

## 2018-10-24 DIAGNOSIS — Z0000 Encounter for general adult medical examination without abnormal findings: Secondary | ICD-10-CM

## 2018-10-24 DIAGNOSIS — N138 Other obstructive and reflux uropathy: Secondary | ICD-10-CM

## 2018-10-24 DIAGNOSIS — C9001 Multiple myeloma in remission: Secondary | ICD-10-CM

## 2018-10-24 DIAGNOSIS — E781 Pure hyperglyceridemia: Secondary | ICD-10-CM

## 2018-11-02 ENCOUNTER — Other Ambulatory Visit: Payer: Self-pay | Admitting: Family Medicine

## 2018-11-02 DIAGNOSIS — J019 Acute sinusitis, unspecified: Secondary | ICD-10-CM

## 2018-11-12 DIAGNOSIS — C9001 Multiple myeloma in remission: Secondary | ICD-10-CM | POA: Diagnosis not present

## 2018-11-14 DIAGNOSIS — N179 Acute kidney failure, unspecified: Secondary | ICD-10-CM | POA: Diagnosis not present

## 2018-11-14 DIAGNOSIS — C9001 Multiple myeloma in remission: Secondary | ICD-10-CM | POA: Diagnosis not present

## 2018-11-14 DIAGNOSIS — R0781 Pleurodynia: Secondary | ICD-10-CM | POA: Diagnosis not present

## 2018-11-14 DIAGNOSIS — G629 Polyneuropathy, unspecified: Secondary | ICD-10-CM | POA: Diagnosis not present

## 2018-12-04 ENCOUNTER — Other Ambulatory Visit: Payer: Self-pay | Admitting: Family Medicine

## 2018-12-04 DIAGNOSIS — J019 Acute sinusitis, unspecified: Secondary | ICD-10-CM

## 2018-12-10 ENCOUNTER — Other Ambulatory Visit: Payer: Self-pay | Admitting: Family Medicine

## 2018-12-10 DIAGNOSIS — C9000 Multiple myeloma not having achieved remission: Secondary | ICD-10-CM | POA: Diagnosis not present

## 2018-12-10 DIAGNOSIS — N179 Acute kidney failure, unspecified: Secondary | ICD-10-CM | POA: Diagnosis not present

## 2018-12-10 DIAGNOSIS — R0781 Pleurodynia: Secondary | ICD-10-CM | POA: Diagnosis not present

## 2018-12-10 DIAGNOSIS — J019 Acute sinusitis, unspecified: Secondary | ICD-10-CM

## 2018-12-10 DIAGNOSIS — L989 Disorder of the skin and subcutaneous tissue, unspecified: Secondary | ICD-10-CM | POA: Diagnosis not present

## 2018-12-10 DIAGNOSIS — C9001 Multiple myeloma in remission: Secondary | ICD-10-CM | POA: Diagnosis not present

## 2018-12-10 DIAGNOSIS — G629 Polyneuropathy, unspecified: Secondary | ICD-10-CM | POA: Diagnosis not present

## 2018-12-10 DIAGNOSIS — K029 Dental caries, unspecified: Secondary | ICD-10-CM | POA: Diagnosis not present

## 2018-12-10 MED ORDER — IPRATROPIUM BROMIDE 0.06 % NA SOLN: 2.0000 | Freq: Four times a day (QID) | NASAL | 1 refills | Status: AC

## 2018-12-12 DIAGNOSIS — C9000 Multiple myeloma not having achieved remission: Secondary | ICD-10-CM | POA: Diagnosis not present

## 2018-12-12 DIAGNOSIS — C9001 Multiple myeloma in remission: Secondary | ICD-10-CM | POA: Diagnosis not present

## 2018-12-19 DIAGNOSIS — C9000 Multiple myeloma not having achieved remission: Secondary | ICD-10-CM | POA: Diagnosis not present

## 2018-12-19 DIAGNOSIS — C9001 Multiple myeloma in remission: Secondary | ICD-10-CM | POA: Diagnosis not present

## 2018-12-24 DIAGNOSIS — C9001 Multiple myeloma in remission: Secondary | ICD-10-CM | POA: Diagnosis not present

## 2019-01-14 DIAGNOSIS — C9001 Multiple myeloma in remission: Secondary | ICD-10-CM | POA: Diagnosis not present

## 2019-01-21 DIAGNOSIS — Z23 Encounter for immunization: Secondary | ICD-10-CM | POA: Diagnosis not present

## 2019-01-21 DIAGNOSIS — C9001 Multiple myeloma in remission: Secondary | ICD-10-CM | POA: Diagnosis not present

## 2019-02-11 DIAGNOSIS — X58XXXA Exposure to other specified factors, initial encounter: Secondary | ICD-10-CM | POA: Diagnosis not present

## 2019-02-11 DIAGNOSIS — R05 Cough: Secondary | ICD-10-CM | POA: Diagnosis not present

## 2019-02-11 DIAGNOSIS — S46811A Strain of other muscles, fascia and tendons at shoulder and upper arm level, right arm, initial encounter: Secondary | ICD-10-CM | POA: Diagnosis not present

## 2019-02-11 DIAGNOSIS — T50905S Adverse effect of unspecified drugs, medicaments and biological substances, sequela: Secondary | ICD-10-CM | POA: Diagnosis not present

## 2019-02-11 DIAGNOSIS — R0981 Nasal congestion: Secondary | ICD-10-CM | POA: Diagnosis not present

## 2019-02-11 DIAGNOSIS — Z9481 Bone marrow transplant status: Secondary | ICD-10-CM | POA: Diagnosis not present

## 2019-02-11 DIAGNOSIS — Y9353 Activity, golf: Secondary | ICD-10-CM | POA: Diagnosis not present

## 2019-02-11 DIAGNOSIS — C9001 Multiple myeloma in remission: Secondary | ICD-10-CM | POA: Diagnosis not present

## 2019-02-11 DIAGNOSIS — C9000 Multiple myeloma not having achieved remission: Secondary | ICD-10-CM | POA: Diagnosis not present

## 2019-02-11 DIAGNOSIS — R0781 Pleurodynia: Secondary | ICD-10-CM | POA: Diagnosis not present

## 2019-02-11 DIAGNOSIS — G620 Drug-induced polyneuropathy: Secondary | ICD-10-CM | POA: Diagnosis not present

## 2019-02-11 DIAGNOSIS — Z79899 Other long term (current) drug therapy: Secondary | ICD-10-CM | POA: Diagnosis not present

## 2019-02-11 DIAGNOSIS — K029 Dental caries, unspecified: Secondary | ICD-10-CM | POA: Diagnosis not present

## 2019-02-11 DIAGNOSIS — S025XXA Fracture of tooth (traumatic), initial encounter for closed fracture: Secondary | ICD-10-CM | POA: Diagnosis not present

## 2019-02-17 ENCOUNTER — Emergency Department: Payer: Medicare Other

## 2019-02-17 ENCOUNTER — Encounter: Payer: Self-pay | Admitting: Emergency Medicine

## 2019-02-17 ENCOUNTER — Other Ambulatory Visit: Payer: Self-pay

## 2019-02-17 ENCOUNTER — Emergency Department
Admission: EM | Admit: 2019-02-17 | Discharge: 2019-02-18 | Disposition: A | Discharge status: Home / Self Care | Payer: Medicare Other | Attending: Emergency Medicine | Admitting: Emergency Medicine

## 2019-02-17 DIAGNOSIS — Z79899 Other long term (current) drug therapy: Secondary | ICD-10-CM | POA: Diagnosis not present

## 2019-02-17 DIAGNOSIS — F17290 Nicotine dependence, other tobacco product, uncomplicated: Secondary | ICD-10-CM | POA: Diagnosis not present

## 2019-02-17 DIAGNOSIS — Z7982 Long term (current) use of aspirin: Secondary | ICD-10-CM | POA: Diagnosis not present

## 2019-02-17 DIAGNOSIS — Z20828 Contact with and (suspected) exposure to other viral communicable diseases: Secondary | ICD-10-CM | POA: Insufficient documentation

## 2019-02-17 DIAGNOSIS — R509 Fever, unspecified: Secondary | ICD-10-CM | POA: Insufficient documentation

## 2019-02-17 LAB — CBC WITH DIFFERENTIAL/PLATELET
Abs Immature Granulocytes: 0.01 10*3/uL (ref 0.00–0.07)
Basophils Absolute: 0 10*3/uL (ref 0.0–0.1)
Basophils Relative: 1 %
Eosinophils Absolute: 0 10*3/uL (ref 0.0–0.5)
Eosinophils Relative: 1 %
HCT: 34.1 % — ABNORMAL LOW (ref 39.0–52.0)
Hemoglobin: 11.6 g/dL — ABNORMAL LOW (ref 13.0–17.0)
Immature Granulocytes: 0 %
Lymphocytes Relative: 22 %
Lymphs Abs: 0.8 10*3/uL (ref 0.7–4.0)
MCH: 32.2 pg (ref 26.0–34.0)
MCHC: 34 g/dL (ref 30.0–36.0)
MCV: 94.7 fL (ref 80.0–100.0)
Monocytes Absolute: 0.7 10*3/uL (ref 0.1–1.0)
Monocytes Relative: 18 %
Neutro Abs: 2.1 10*3/uL (ref 1.7–7.7)
Neutrophils Relative %: 58 %
Platelets: 126 10*3/uL — ABNORMAL LOW (ref 150–400)
RBC: 3.6 MIL/uL — ABNORMAL LOW (ref 4.22–5.81)
RDW: 14.3 % (ref 11.5–15.5)
WBC: 3.7 10*3/uL — ABNORMAL LOW (ref 4.0–10.5)
nRBC: 0 % (ref 0.0–0.2)

## 2019-02-17 LAB — BASIC METABOLIC PANEL WITH GFR
Anion gap: 8 (ref 5–15)
BUN: 17 mg/dL (ref 8–23)
CO2: 20 mmol/L — ABNORMAL LOW (ref 22–32)
Calcium: 8.3 mg/dL — ABNORMAL LOW (ref 8.9–10.3)
Chloride: 107 mmol/L (ref 98–111)
Creatinine, Ser: 1.25 mg/dL — ABNORMAL HIGH (ref 0.61–1.24)
GFR calc Af Amer: >60 mL/min
GFR calc non Af Amer: >60 mL/min
Glucose, Bld: 112 mg/dL — ABNORMAL HIGH (ref 70–99)
Potassium: 3.6 mmol/L (ref 3.5–5.1)
Sodium: 135 mmol/L (ref 135–145)

## 2019-02-17 NOTE — Discharge Instructions (Signed)
Please seek medical attention for any high fevers, chest pain, shortness of breath, change in behavior, persistent vomiting, bloody stool or any other new or concerning symptoms.  

## 2019-02-17 NOTE — ED Triage Notes (Addendum)
Pt presents to ED with fever since Friday. Pt states he is seen by [facility] for multiple myeloma and was told to come to ED for further evaluation after he noticed his fever increased tonight. tylenol taken approx 1 hour ago.  Pt states he takes a chemo pill daily.

## 2019-02-18 LAB — URINALYSIS, COMPLETE (UACMP) WITH MICROSCOPIC
Bacteria, UA: NONE SEEN
Bilirubin Urine: NEGATIVE
Glucose, UA: NEGATIVE mg/dL
Ketones, ur: NEGATIVE mg/dL
Leukocytes,Ua: NEGATIVE
Nitrite: NEGATIVE
Protein, ur: NEGATIVE mg/dL
Specific Gravity, Urine: 1.015 (ref 1.005–1.030)
Squamous Epithelial / HPF: NONE SEEN (ref 0–5)
WBC, UA: NONE SEEN WBC/hpf (ref 0–5)
pH: 6 (ref 5.0–8.0)

## 2019-02-18 NOTE — ED Provider Notes (Signed)
Patient received in signout from Dr. Archie Balboa.  Work-up pending urinalysis.  He is well-appearing.  No acute distress.  Urinalysis shows no evidence of infection.  He feels well.  Repeat temperature is 98.6.  Does not meet criteria for sepsis.  Denies any tick bites.  This point do believe he stable and appropriate for outpatient follow-up.  Discussed signs and symptoms for which he should seek medical evaluation.  Have discussed with the patient and available family all diagnostics and treatments performed thus far and all questions were answered to the best of my ability. The patient demonstrates understanding and agreement with plan.    Merlyn Lot, MD 02/18/19 [phone]

## 2019-02-19 LAB — NOVEL CORONAVIRUS, NAA (HOSP ORDER, SEND-OUT TO REF LAB; TAT 18-24 HRS): SARS-CoV-2, NAA: NOT DETECTED

## 2019-02-26 NOTE — ED Provider Notes (Signed)
[facility] Emergency Department Provider Note   ____________________________________________   I have reviewed the triage vital signs and the nursing notes.   HISTORY  Chief Complaint Fever   History limited by: Not Limited   HPI VAHE PIENTA is a 63 y.o. male who presents to the emergency department today because of concern for fever. The patient has history of multiple myeloma. States that he being treated for this. He did take a tylenol prior to arrival. Denies any chest pain or shortness of breath. Denies any nausea, vomiting or diarrhea. Denies any rash. Spoke to his cancer physicians who recommended he be evaluated in the emergency department.   Records reviewed. Per medical record review patient has a history of MM.  Past Medical History:  Diagnosis Date  . Arthritis   . Compression fracture of cervical spine at C2-C3 level    Rods in neck. limited L/R movement  . Lytic bone lesions on xray   . Multiple myeloma without remission [facility])     Patient Active Problem List   Diagnosis Date Noted  . AKI (acute kidney injury) ([place]) 08/06/2018  . Neutropenia ([place])   . Multiple myeloma in remission ([place])   . Febrile neutropenia ([place]) 08/05/2018  . Colon cancer screening 09/08/2017  . BPH with obstruction/lower urinary tract symptoms 09/07/2017  . Multiple myeloma without remission ([place]) 03/21/2016  . Cervical mass 03/14/2016  . Muscle spasms of neck 03/07/2016  . Tinnitus of both ears 02/07/2016  . Onychomycosis 01/23/2016    Past Surgical History:  Procedure Laterality Date  . biopsy of the vertebral body     . COLONOSCOPY WITH PROPOFOL N/A 10/31/2017   Procedure: COLONOSCOPY WITH PROPOFOL;  Surgeon: Lin Landsman, MD;  Location: [place];  Service: Endoscopy;  Laterality: N/A;  . CT GUIDED BONE BIOPSY  03/23/2016  . NECK SURGERY    . POLYPECTOMY  10/31/2017   Procedure: POLYPECTOMY;  Surgeon: Lin Landsman, MD;   Location: [place];  Service: Endoscopy;;    Prior to Admission medications   Medication Sig Start Date End Date Taking? Authorizing Provider  acyclovir (ZOVIRAX) 400 MG tablet Take 1 tablet (400 mg total) by mouth 2 (two) times daily. 03/21/16   Cammie Sickle, MD  aspirin EC 81 MG tablet Take 162 mg by mouth daily.     [provider]  gabapentin (NEURONTIN) 300 MG capsule Take by mouth. 08/16/18 08/16/19  [provider]  ipratropium (ATROVENT) 0.06 % nasal spray Place 2 sprays into both nostrils 4 (four) times daily. For up to 5-7 days then stop. 12/10/18   Karamalegos, Devonne Doughty, DO  magnesium oxide (MAG-OX) 400 MG tablet Take by mouth. 08/27/18 08/27/19  [provider]  omeprazole (PRILOSEC) 20 MG capsule Take 20 mg by mouth daily.    [provider]  REVLIMID 10 MG capsule Take 10 mg by mouth daily.  10/25/17   [provider]  tamsulosin (FLOMAX) 0.4 MG CAPS capsule TAKE 2 CAPSULES (0.8 MG TOTAL) BY MOUTH DAILY AFTER BREAKFAST. 09/11/18   Olin Hauser, DO  Vitamin D, Ergocalciferol, (DRISDOL) 50000 units CAPS capsule Take 50,000 Units by mouth every Tuesday.     [provider]    Allergies Patient has no known allergies.  Family History  Problem Relation Age of Onset  . Heart attack Father   . Renal cancer Father   . CAD Mother   . Diabetes Mellitus II Mother   . Heart  disease Mother   . Diabetes Mother   . Skin cancer Mother        non melanoma  . Prostate cancer Neg Hx   . Colon cancer Neg Hx     Social History Social History   Tobacco Use  . Smoking status: Never Smoker  . Smokeless tobacco: Current User    Types: Chew  Substance Use Topics  . Alcohol use: No    Alcohol/week: 0.0 standard drinks  . Drug use: No    Review of Systems Constitutional: Positive for fever. Eyes: No visual changes. ENT: No sore throat. Cardiovascular: Denies chest pain. Respiratory: Denies shortness of  breath. Gastrointestinal: No abdominal pain.  No nausea, no vomiting.  No diarrhea.   Genitourinary: Negative for dysuria. Musculoskeletal: Negative for back pain. Skin: Negative for rash. Neurological: Negative for headaches, focal weakness or numbness.  ____________________________________________   PHYSICAL EXAM:  VITAL SIGNS: ED Triage Vitals  Enc Vitals Group     BP 02/17/19 2143 131/62     Pulse Rate 02/17/19 2143 71     Resp 02/17/19 2143 20     Temp 02/17/19 2143 99.2 F (37.3 C)     Temp Source 02/17/19 2143 Oral     SpO2 02/17/19 2143 96 %     Weight 02/17/19 2144 290 lb (131.5 kg)     Height 02/17/19 2144 6' (1.829 m)     Head Circumference --      Peak Flow --      Pain Score 02/17/19 2144 0   Constitutional: Alert and oriented.  Eyes: Conjunctivae are normal.  ENT      Head: Normocephalic and atraumatic.      Nose: No congestion/rhinnorhea.      Mouth/Throat: Mucous membranes are moist.      Neck: No stridor. Hematological/Lymphatic/Immunilogical: No cervical lymphadenopathy. Cardiovascular: Normal rate, regular rhythm.  No murmurs, rubs, or gallops.  Respiratory: Normal respiratory effort without tachypnea nor retractions. Breath sounds are clear and equal bilaterally. No wheezes/rales/rhonchi. Gastrointestinal: Soft and non tender. No rebound. No guarding.  Genitourinary: Deferred Musculoskeletal: Normal range of motion in all extremities. No lower extremity edema. Neurologic:  Normal speech and language. No gross focal neurologic deficits are appreciated.  Skin:  Skin is warm, dry and intact. No rash noted. Psychiatric: Mood and affect are normal. Speech and behavior are normal. Patient exhibits appropriate insight and judgment.  ____________________________________________    LABS (pertinent positives/negatives)  BMP na 135, k 3.6, glu 112, cr 1.25 CBC wbc 3.7, hgb 11.6, plt  126  ____________________________________________   EKG  None  ____________________________________________    RADIOLOGY  CXR No pneumonia or infiltrate.   ____________________________________________   PROCEDURES  Procedures  ____________________________________________   INITIAL IMPRESSION / ASSESSMENT AND PLAN / ED COURSE  Pertinent labs & imaging results that were available during my care of the patient were reviewed by me and considered in my medical decision making (see chart for details).   Patient with history of MM who presented to the emergency department today because of concern for fever. Patient denies any other concerning symptoms. CXR without pneumonia. Blood work without leukocytosis. Will check UA and COVID. Do feel patient will likely be able to be discharged given reassuring exam.    ____________________________________________   FINAL CLINICAL IMPRESSION(S) / ED DIAGNOSES  Final diagnoses:  Fever, unspecified fever cause     Note: This dictation was prepared with Dragon dictation. Any transcriptional errors that result from this process are unintentional  Nance Pear, MD 02/26/19 [phone]

## 2019-02-27 DIAGNOSIS — N281 Cyst of kidney, acquired: Secondary | ICD-10-CM | POA: Diagnosis not present

## 2019-03-04 ENCOUNTER — Other Ambulatory Visit: Payer: Self-pay | Admitting: Family Medicine

## 2019-03-04 DIAGNOSIS — N138 Other obstructive and reflux uropathy: Secondary | ICD-10-CM

## 2019-03-11 DIAGNOSIS — Z48298 Encounter for aftercare following other organ transplant: Secondary | ICD-10-CM | POA: Diagnosis not present

## 2019-03-11 DIAGNOSIS — C9000 Multiple myeloma not having achieved remission: Secondary | ICD-10-CM | POA: Diagnosis not present

## 2019-03-11 DIAGNOSIS — Z23 Encounter for immunization: Secondary | ICD-10-CM | POA: Diagnosis not present

## 2019-03-11 DIAGNOSIS — R531 Weakness: Secondary | ICD-10-CM | POA: Diagnosis not present

## 2019-03-11 DIAGNOSIS — R0781 Pleurodynia: Secondary | ICD-10-CM | POA: Diagnosis not present

## 2019-03-11 DIAGNOSIS — L988 Other specified disorders of the skin and subcutaneous tissue: Secondary | ICD-10-CM | POA: Diagnosis not present

## 2019-03-11 DIAGNOSIS — C9001 Multiple myeloma in remission: Secondary | ICD-10-CM | POA: Diagnosis not present

## 2019-03-11 DIAGNOSIS — Z79899 Other long term (current) drug therapy: Secondary | ICD-10-CM | POA: Diagnosis not present

## 2019-03-11 DIAGNOSIS — N179 Acute kidney failure, unspecified: Secondary | ICD-10-CM | POA: Diagnosis not present

## 2019-03-11 DIAGNOSIS — Z9484 Stem cells transplant status: Secondary | ICD-10-CM | POA: Diagnosis not present

## 2019-03-11 DIAGNOSIS — G620 Drug-induced polyneuropathy: Secondary | ICD-10-CM | POA: Diagnosis not present

## 2019-03-11 DIAGNOSIS — N281 Cyst of kidney, acquired: Secondary | ICD-10-CM | POA: Diagnosis not present

## 2019-04-07 DIAGNOSIS — Z981 Arthrodesis status: Secondary | ICD-10-CM | POA: Diagnosis not present

## 2019-04-07 DIAGNOSIS — M5030 Other cervical disc degeneration, unspecified cervical region: Secondary | ICD-10-CM | POA: Diagnosis not present

## 2019-04-07 DIAGNOSIS — C9001 Multiple myeloma in remission: Secondary | ICD-10-CM | POA: Diagnosis not present

## 2019-04-07 DIAGNOSIS — S12200D Unspecified displaced fracture of third cervical vertebra, subsequent encounter for fracture with routine healing: Secondary | ICD-10-CM | POA: Diagnosis not present

## 2019-04-07 DIAGNOSIS — Z9481 Bone marrow transplant status: Secondary | ICD-10-CM | POA: Diagnosis not present

## 2019-04-07 DIAGNOSIS — C9000 Multiple myeloma not having achieved remission: Secondary | ICD-10-CM | POA: Diagnosis not present

## 2019-04-08 DIAGNOSIS — Z9481 Bone marrow transplant status: Secondary | ICD-10-CM | POA: Diagnosis not present

## 2019-04-08 DIAGNOSIS — Z981 Arthrodesis status: Secondary | ICD-10-CM | POA: Diagnosis not present

## 2019-05-13 DIAGNOSIS — C9001 Multiple myeloma in remission: Secondary | ICD-10-CM | POA: Diagnosis not present

## 2019-05-18 IMAGING — DX DG ABD PORTABLE 1V
1 series · 1 of 1 positions shown · non-contrast
Comparison: [place] image for CT scan dated 03/15/2016

CLINICAL DATA: OG tube placement.

EXAM:
PORTABLE ABDOMEN - 1 VIEW

[abdomen supine]
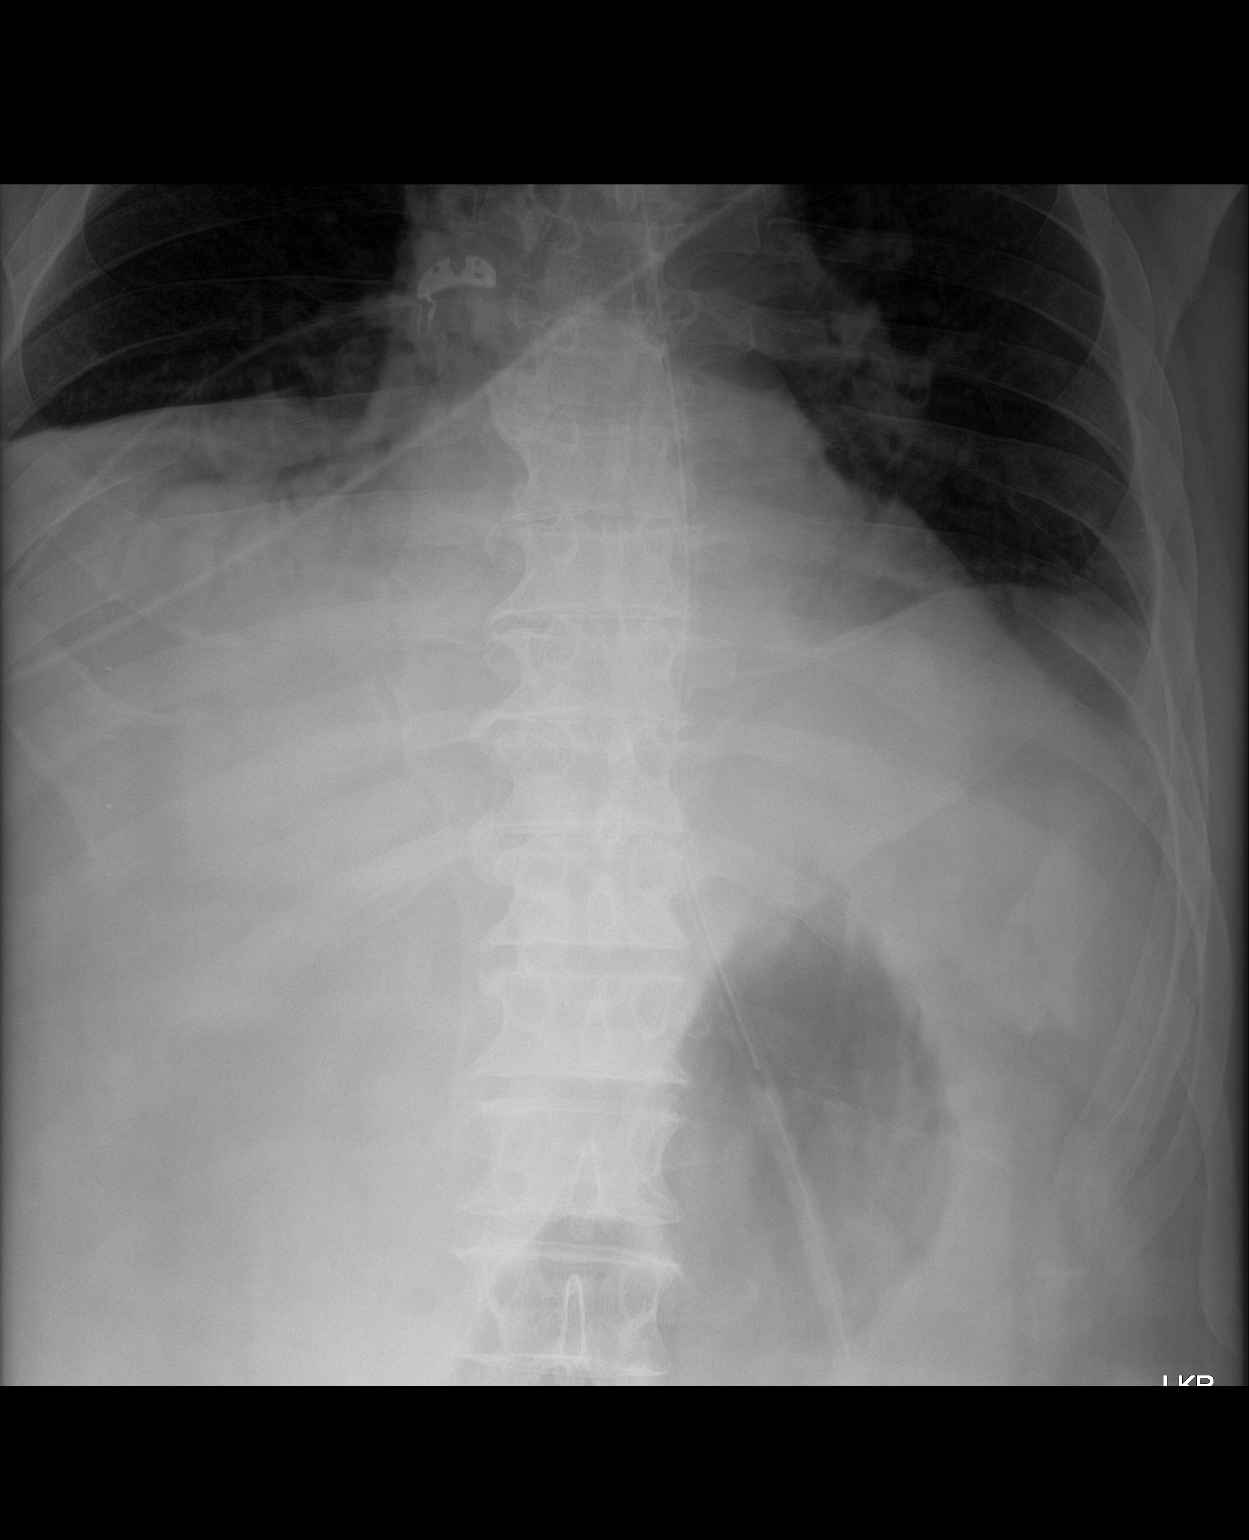

[1 of 1 positions shown; findings below may reference images not displayed]

FINDINGS: OG tube tip is in the mid stomach. Endotracheal tube tip is 3 cm
above the carina. Focal consolidation at the right lung base.

No visible dilated bowel.
IMPRESSION: OG tube tip in the mid stomach. Consolidation at the right lung
base.

## 2019-05-18 IMAGING — DX DG CHEST 1V
1 series · 1 of 1 positions shown · non-contrast
Comparison: 08/06/2018 at [DATE] a.m.

CLINICAL DATA: Endotracheal tube placement.

EXAM:
CHEST  1 VIEW [DATE] p.m.

[chest ap]
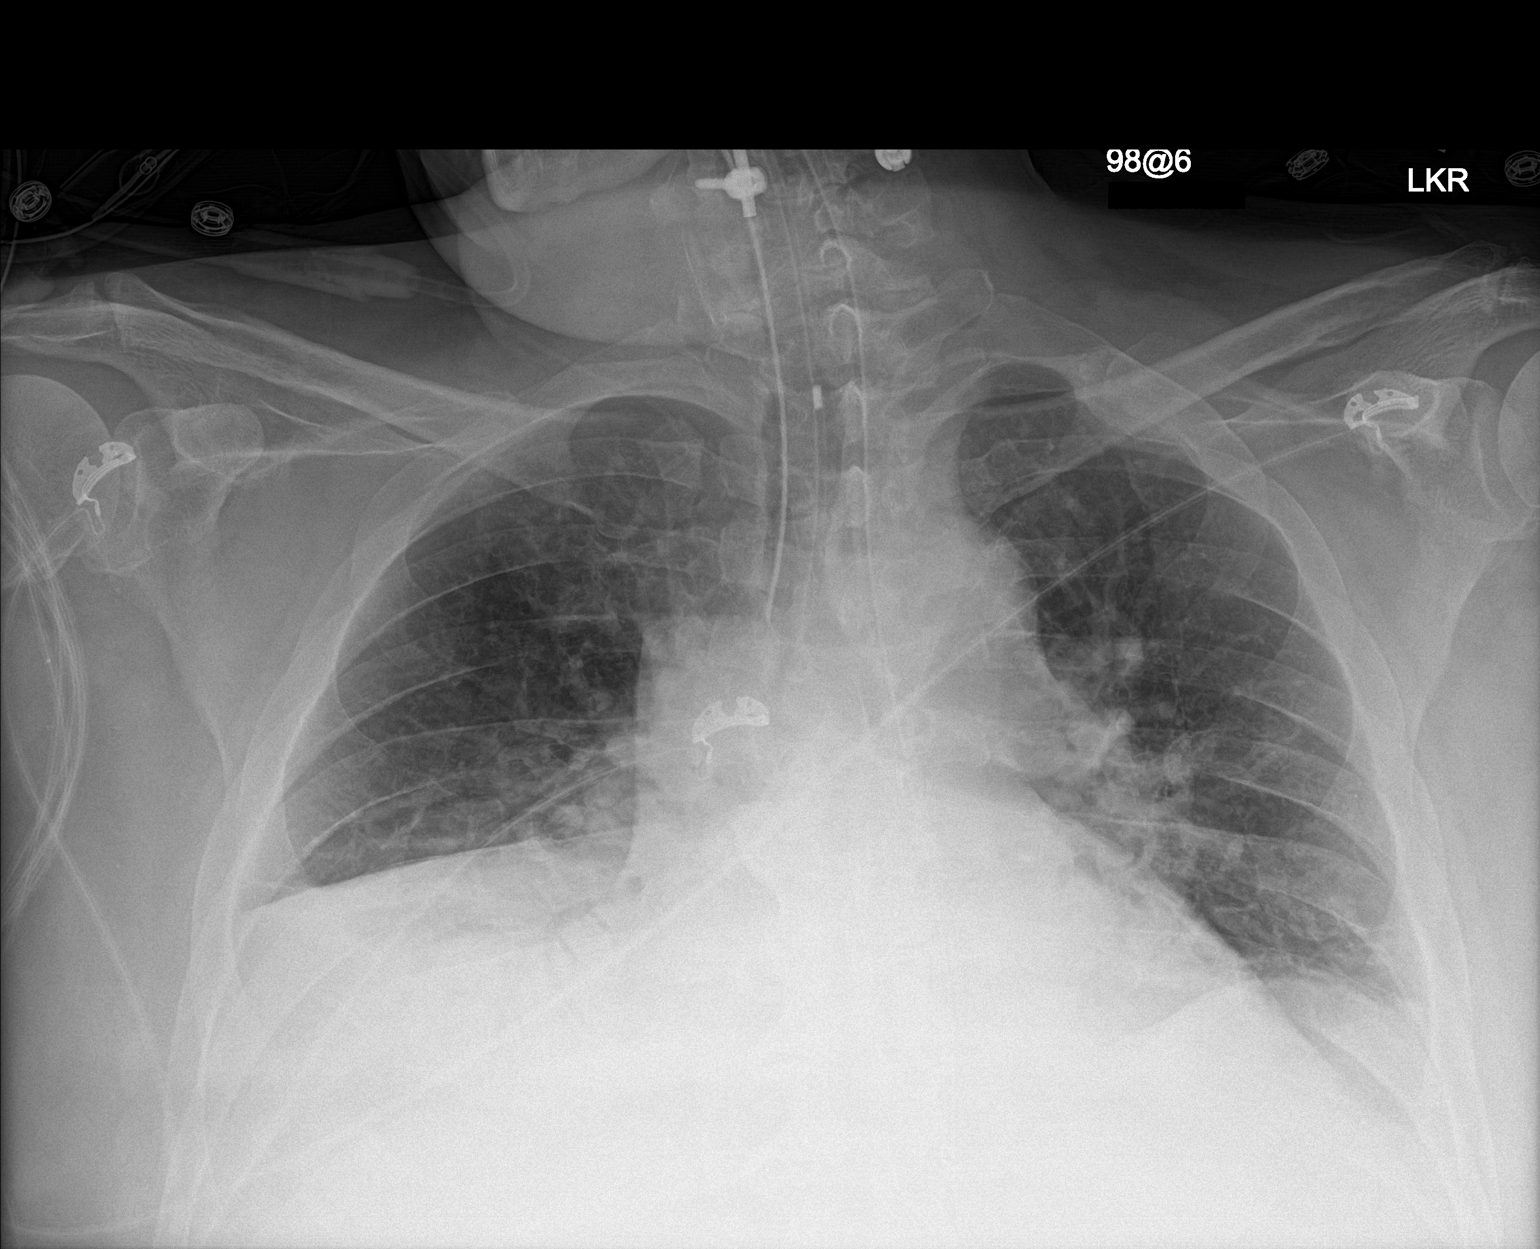

[1 of 1 positions shown; findings below may reference images not displayed]

FINDINGS: Endotracheal tube has been inserted and the tip is 3 cm above the
carina. OG tube tip is below the diaphragm.

There is progressive consolidation at the right lung base with air
bronchograms. This involves at least the right middle lobe. New
slight atelectasis at the left lung base.

Heart size and vascularity are normal. No acute bone abnormality.
Known multiple myeloma.
IMPRESSION: Endotracheal tube and OG tube appear in good position. Progressive
consolidation at the right lung base.

## 2019-05-18 IMAGING — DX DG CHEST 1V PORT
1 series · 1 of 1 positions shown · non-contrast
Comparison: 08/05/2018.

CLINICAL DATA: Acute respiratory failure.

EXAM:
PORTABLE CHEST 1 VIEW

[chest ap]
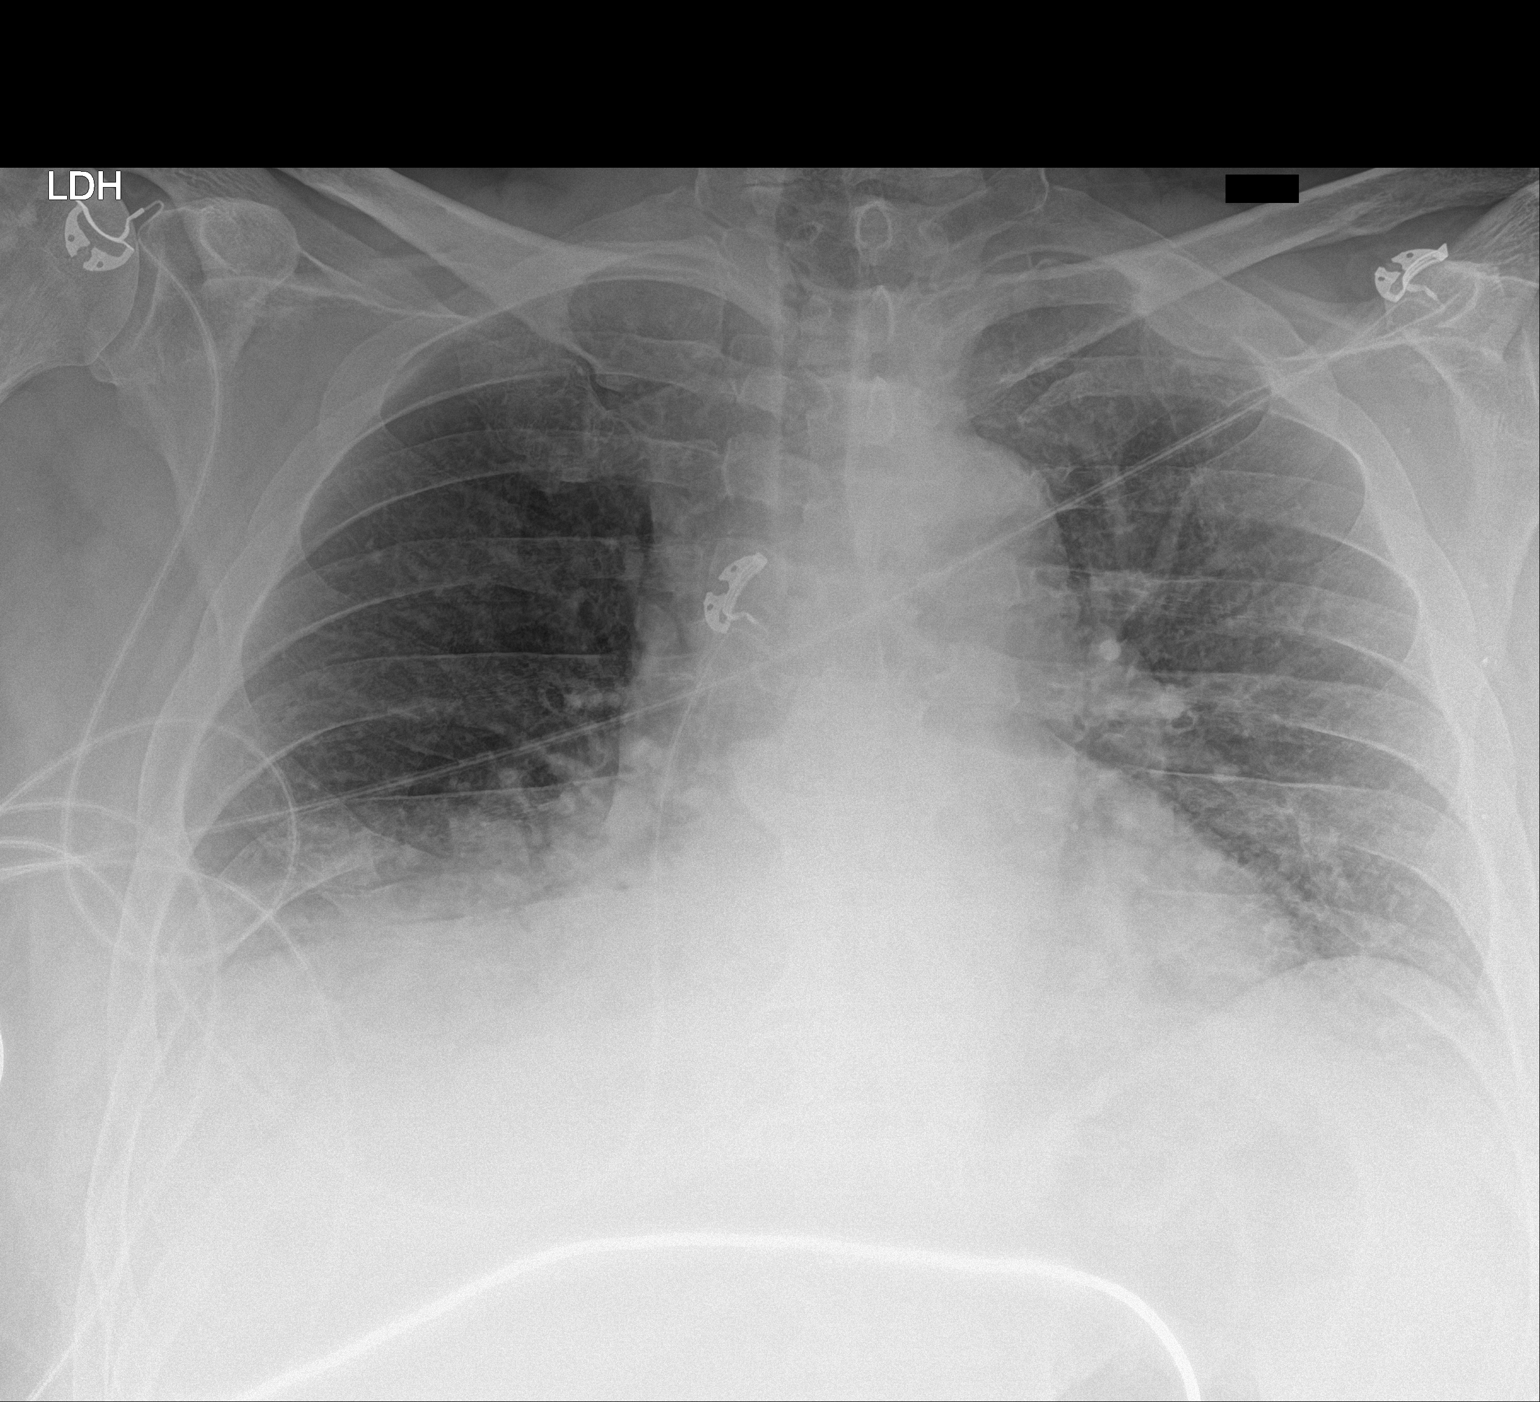

[1 of 1 positions shown; findings below may reference images not displayed]

FINDINGS: Mild cardiomegaly with mild pulmonary venous congestion. Persistent
right middle lobe dense atelectasis/infiltrate. Small right pleural
effusion. No pneumothorax. Prior cervical spine fusion.
IMPRESSION: 1. Persistent right middle lobe dense atelectasis/consolidation.
Right middle lobe pneumonia is most likely present. Small right
pleural effusion. 2. Cardiomegaly with mild pulmonary venous
congestion.

## 2019-05-19 IMAGING — DX DG CHEST 1V PORT
1 series · 1 of 1 positions shown · non-contrast
Comparison: 08/06/2018

CLINICAL DATA: Acute respiratory failure.

EXAM:
PORTABLE CHEST 1 VIEW

[chest ap]
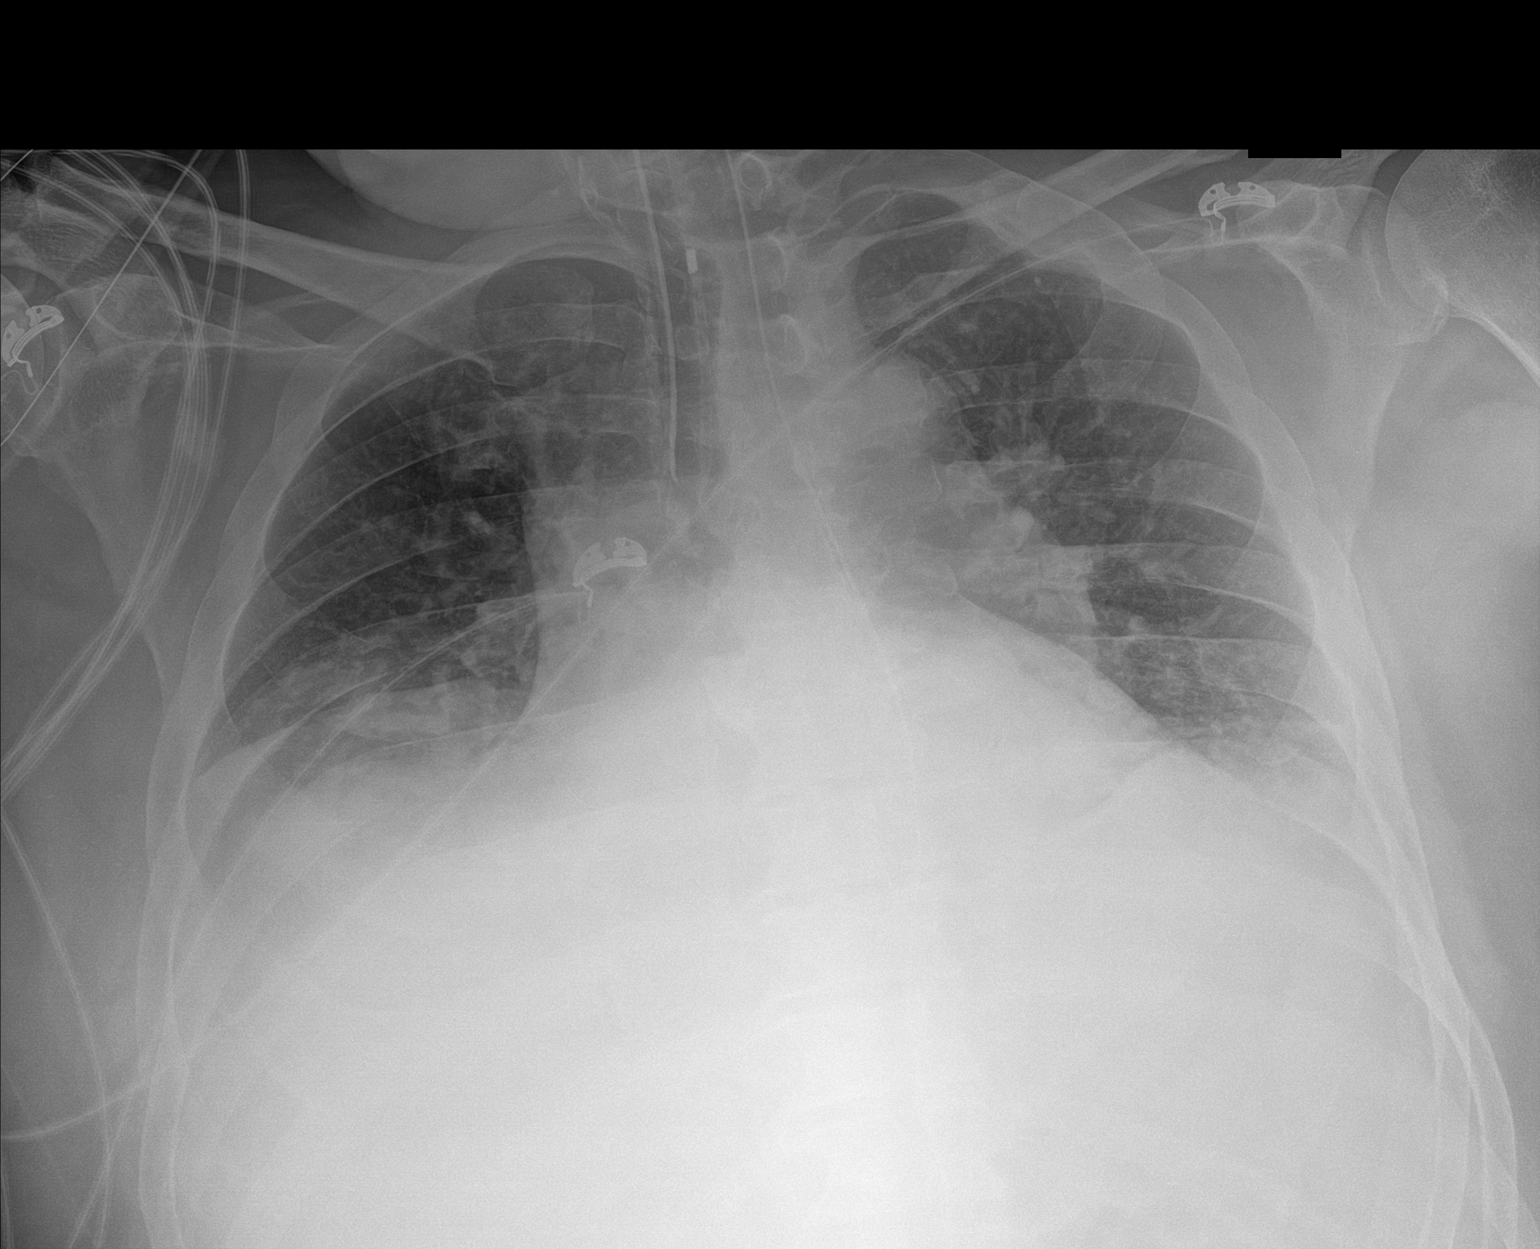

[1 of 1 positions shown; findings below may reference images not displayed]

FINDINGS: The patient is rotated to the right. Endotracheal tube terminates 2
cm above the carina. Enteric tube courses into the left upper
abdomen with tip not imaged. Lung volumes remain low. There is
persistent right basilar consolidation. Milder opacity remains in
the left lung base. No large pleural effusion or pneumothorax is
identified.
IMPRESSION: Low lung volumes with persistent right basilar consolidation. Milder
consolidation or atelectasis in the left lung base.

## 2019-05-20 IMAGING — DX DG CHEST 1V PORT
1 series · 1 of 1 positions shown · non-contrast
Comparison: 08/07/2018

CLINICAL DATA: Ventilator dependent.

EXAM:
PORTABLE CHEST 1 VIEW

[chest ap]
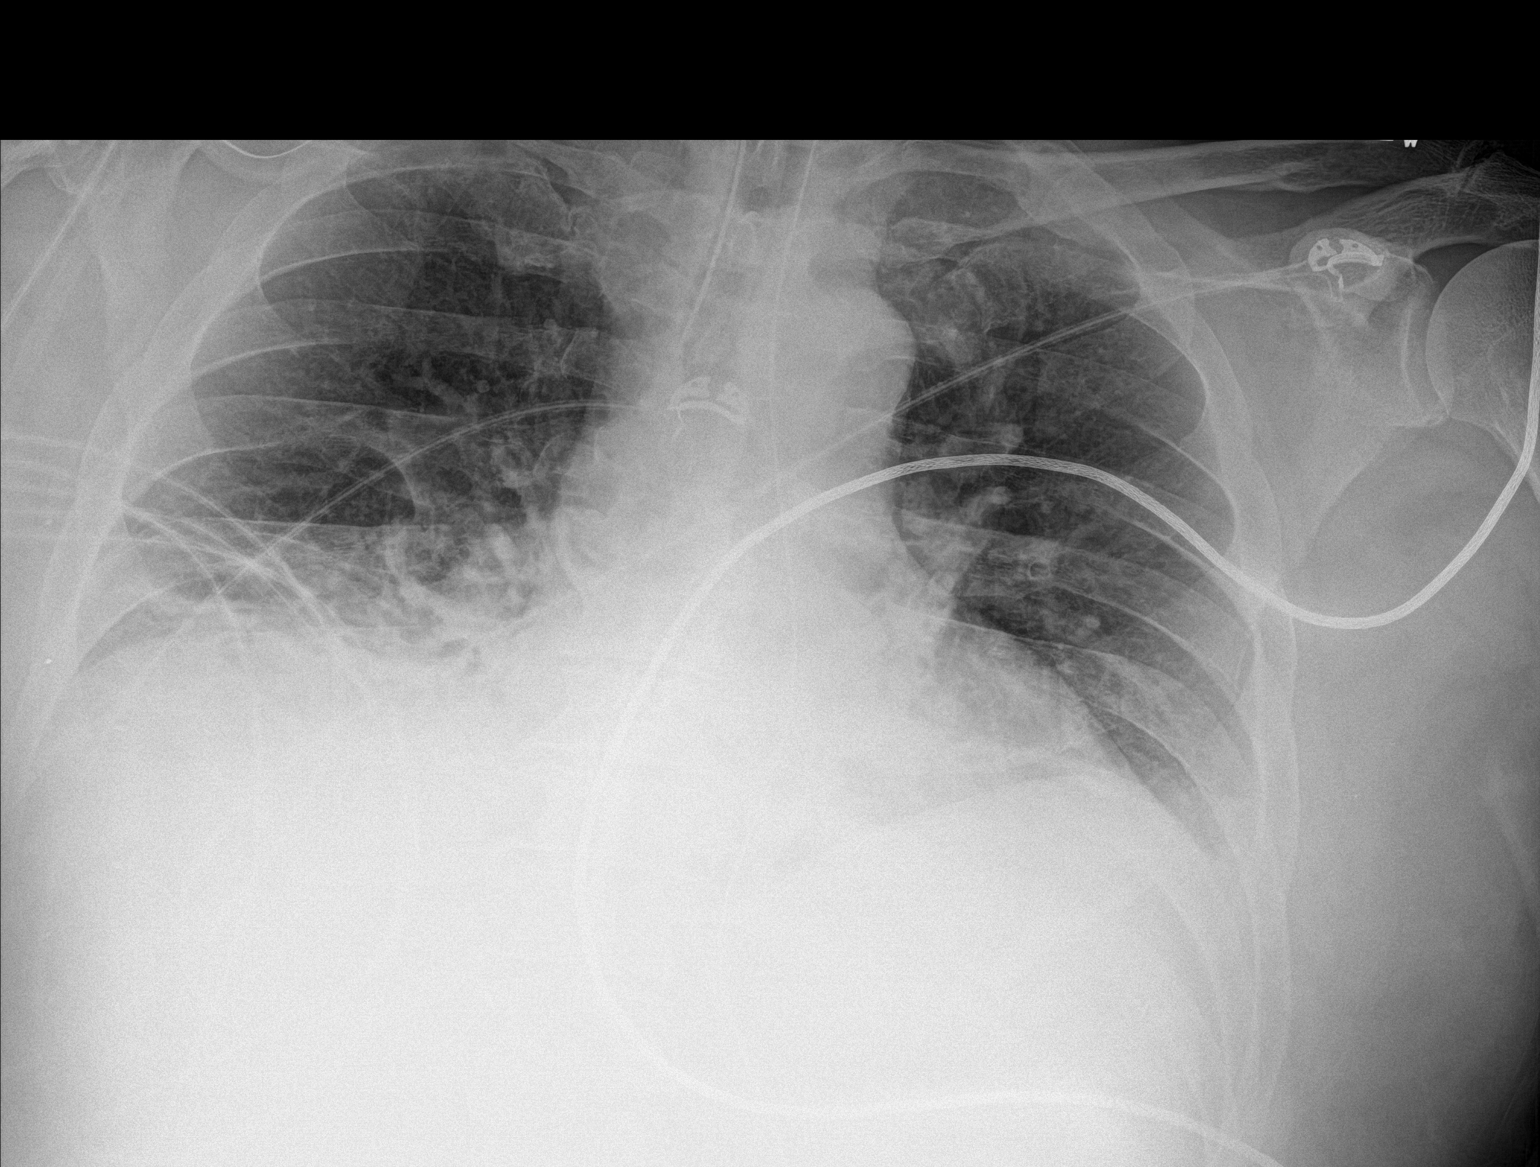

[1 of 1 positions shown; findings below may reference images not displayed]

FINDINGS: Endotracheal tube with the tip 2.9 cm above the carina. Nasogastric
tube coursing below the diaphragm.

Bibasilar airspace disease likely reflecting atelectasis. Relatively
low lung volumes. No pleural effusion or pneumothorax. Stable
cardiomediastinal silhouette. No aggressive osseous lesion.
IMPRESSION: 1. Endotracheal tube with the tip 2.9 cm above the carina.
2. Bibasilar atelectasis.  Low lung volumes.

## 2019-06-10 DIAGNOSIS — C9001 Multiple myeloma in remission: Secondary | ICD-10-CM | POA: Diagnosis not present

## 2019-06-10 DIAGNOSIS — R252 Cramp and spasm: Secondary | ICD-10-CM | POA: Diagnosis not present

## 2019-06-17 DIAGNOSIS — C9001 Multiple myeloma in remission: Secondary | ICD-10-CM | POA: Diagnosis not present

## 2019-07-15 DIAGNOSIS — C9001 Multiple myeloma in remission: Secondary | ICD-10-CM | POA: Diagnosis not present

## 2019-07-15 DIAGNOSIS — R197 Diarrhea, unspecified: Secondary | ICD-10-CM | POA: Diagnosis not present

## 2019-07-22 DIAGNOSIS — C9001 Multiple myeloma in remission: Secondary | ICD-10-CM | POA: Diagnosis not present

## 2019-08-12 DIAGNOSIS — C9001 Multiple myeloma in remission: Secondary | ICD-10-CM | POA: Diagnosis not present

## 2019-08-25 ENCOUNTER — Other Ambulatory Visit: Payer: Self-pay

## 2019-08-25 ENCOUNTER — Encounter: Payer: Self-pay | Admitting: Family Medicine

## 2019-08-25 ENCOUNTER — Ambulatory Visit (INDEPENDENT_AMBULATORY_CARE_PROVIDER_SITE_OTHER): Payer: Medicare Other | Admitting: Family Medicine

## 2019-08-25 DIAGNOSIS — J0100 Acute maxillary sinusitis, unspecified: Secondary | ICD-10-CM

## 2019-08-25 NOTE — Progress Notes (Signed)
Virtual Visit via Telephone The purpose of this virtual visit is to provide medical care while limiting exposure to the novel coronavirus (COVID19) for both patient and office staff.  Consent was obtained for phone visit:  Yes.   Answered questions that patient had about telehealth interaction:  Yes.   I discussed the limitations, risks, security and privacy concerns of performing an evaluation and management service by telephone. I also discussed with the patient that there may be a patient responsible charge related to this service. The patient expressed understanding and agreed to proceed.  Patient Location: Home Provider Location: [facility])  ---------------------------------------------------------------------- Chief Complaint  Patient presents with  . Nasal Congestion    hoareness, lose of taste and smell and head congestions x 4 days     S: Reviewed CMA documentation. I have called patient and gathered additional HPI as follows:  SINUSITIS / Congestion Reports that symptoms started 4 days ago, similar to prior sinusitis, he has used atrovent nasal spray for congestion with some relief. He has not taken any anti histamine yet. He tried Zicam. In past he has required antibiotics sooner to avoid pneumonia or other complication. He has history of Multiple myeloma in remission. No concerning COVID exposure, but he admits he would agree to testing. He has loss of taste and smell but this is somewhat chronic for him due to medicine and has had issue with sinuses - Admits nasal congestion and sinus pressure  Denies any high risk travel to areas of current concern for COVID19. Denies any known or suspected exposure to person with or possibly with COVID19.  Denies any fevers, chills, sweats, body ache, cough, shortness of breath, headache, abdominal pain, diarrhea  Past Medical History:  Diagnosis Date  . Arthritis   . Compression fracture of cervical spine at  C2-C3 level    Rods in neck. limited L/R movement  . Lytic bone lesions on xray   . Multiple myeloma without remission (HCC)    Social History   Tobacco Use  . Smoking status: Never Smoker  . Smokeless tobacco: Current User    Types: Chew  Substance Use Topics  . Alcohol use: No    Alcohol/week: 0.0 standard drinks  . Drug use: No    Current Outpatient Medications:  .  acetaminophen (TYLENOL) 500 MG tablet, Take 1,000 mg by mouth 2 (two) times daily. , Disp: , Rfl:  .  acyclovir (ZOVIRAX) 400 MG tablet, Take 1 tablet (400 mg total) by mouth 2 (two) times daily., Disp: 60 tablet, Rfl: 3 .  aspirin EC 81 MG tablet, Take 162 mg by mouth daily. , Disp: , Rfl:  .  Homeopathic Products (ZICAM COLD REMEDY PO), Take by mouth., Disp: , Rfl:  .  ipratropium (ATROVENT) 0.06 % nasal spray, Place 2 sprays into both nostrils 4 (four) times daily. For up to 5-7 days then stop., Disp: 45 mL, Rfl: 1 .  magnesium oxide (MAG-OX) 400 MG tablet, Take 400 mg by mouth 2 (two) times daily. , Disp: , Rfl:  .  omeprazole (PRILOSEC) 20 MG capsule, Take 20 mg by mouth daily., Disp: , Rfl:  .  REVLIMID 10 MG capsule, Take 10 mg by mouth daily. , Disp: , Rfl:  .  Vitamin D, Ergocalciferol, (DRISDOL) 50000 units CAPS capsule, Take 50,000 Units by mouth every Tuesday. , Disp: , Rfl:  .  cyclobenzaprine (FLEXERIL) 5 MG tablet, Take 5 mg by mouth 3 (three) times daily as needed., Disp: ,  Rfl:  .  gabapentin (NEURONTIN) 300 MG capsule, Take by mouth., Disp: , Rfl:  .  tamsulosin (FLOMAX) 0.4 MG CAPS capsule, TAKE 2 CAPSULES (0.8 MG TOTAL) BY MOUTH DAILY AFTER BREAKFAST. (Patient not taking: Reported on 08/25/2019), Disp: 60 capsule, Rfl: 5  Depression screen [facility] 2/9 10/11/2018 04/11/2018 02/14/2018  Decreased Interest 0 0 0  Down, Depressed, Hopeless 0 0 0  PHQ - 2 Score 0 0 0    No flowsheet data found.  -------------------------------------------------------------------------- O: No physical exam performed due to  remote telephone encounter.  Lab results reviewed.  No results found for this or any previous visit (from the past 2160 hour(s)).  -------------------------------------------------------------------------- A&P:  Problem List Items Addressed This Visit    None    Visit Diagnoses    Acute non-recurrent maxillary sinusitis    -  Primary      Suspected Acute Sinusitis, possible for benign viral etiology at onset - now concern with progression of symptoms, consider 2nd sickening and cannot rule out bacterial infection.  Cannot rule out COVID19, has overlapping symptoms also loss taste smell  - Reassuring without high risk symptoms - Afebrile, without dyspnea - Immunocompromise, history multiple myeloma   1. Use Atrovent nasal decongestant 2. START Anti histamine oral (Claritin, Zyrtec, Allegra) 3. Home OTC sinus treatments  RECOMMEND COVID19 testing through [facility], info given for him to sign up and get tested  Call in 48 hours if not improved we can send Augmentin antibiotic for sinusitis, given prior history of sinusitis and pneumonia complications.  REQUIRED self quarantine to [place] - advised to avoid all exposure with others while during TESTING (Pending result) and treatment. Should continue to quarantine for up to 7-14 days - pending resolution of symptoms, if TEST IS NEGATIVE and symptoms resolve by 7 days and is afebrile >3 days - may STOP self quarantine at that time. IF test is POSITIVE then will require 10 additional day quarantine after date of positive test result.   If symptoms do not resolve or significantly improve OR if WORSENING - fever / cough - or worsening shortness of breath - then should contact us and seek advice on next steps in treatment at home vs where/when to seek care at Urgent Care or Hospital ED for further intervention and possible testing if indicated.   No orders of the defined types were placed in this  encounter.   Follow-up: - Return in 1 week as needed sinus  Patient verbalizes understanding with the above medical recommendations including the limitation of remote medical advice.  Specific follow-up and call-back criteria were given for patient to follow-up or seek medical care more urgently if needed.   - Time spent in direct consultation with patient on phone: 9 minutes   Nobie Putnam, [facility] 08/25/2019, 1:34 PM

## 2019-08-25 NOTE — Patient Instructions (Addendum)
Start with anti histamine (Claritin, Zyrtec, or Allegra)  Continue Atrovent nasal spray decongestant 2 sprays in each nostril up to 4 times daily for 7 days  IF NOT IMPROVED by 48 hours - call or message on mychart, we can offer antibiotic - Augmentin if not improved to cover for sinuses.   [email or web address]  You may have coronavirus / [place] Testing Information  COVID-19 Testing By Appointment Only  Indoor Test site now. No longer outdoor drive up testing.  Online scheduling can be done online at [email or web address] or by texting "COVID" to [phone].  Test result may take 2-7 days to result. You will be notified by MyChart or by Phone.  Phone: [phone] [facility] contact, can inquire about status of test result)  If negative test - they will call you with result. If abnormal or positive test you will be notified as well and our office will contact you to help further with treatment plan.  May take Tylenol as needed for aches pains and fever. Prefer to avoid Ibuprofen if can help it, to avoid complication from virus.  REQUIRED self quarantine to [place] - advised to avoid all exposure with others while during treatment. Should continue to quarantine for up to 7-14 days, pending resolution of symptoms, if symptoms resolve by 7 days and is afebrile >3 days - may STOP self quarantine at that time.  If symptoms do not resolve or significantly improve OR if WORSENING - fever / cough - or worsening shortness of breath - then should contact us and seek advice on next steps in treatment at home vs where/when to seek care at Urgent Care or Hospital ED for further intervention      Please schedule a Follow-up Appointment to: Return in about 1 week (around 09/01/2019), or if symptoms worsen or fail to improve, for sinus.  If you have any other questions or concerns, please feel free to call the office or  send a message through [place]. You may also schedule an earlier appointment if necessary.  Additionally, you may be receiving a survey about your experience at our office within a few days to 1 week by e-mail or mail. We value your feedback.  Nobie Putnam, DO [place]

## 2019-08-26 ENCOUNTER — Other Ambulatory Visit: Payer: Medicare Other

## 2019-08-26 ENCOUNTER — Other Ambulatory Visit: Payer: Self-pay | Admitting: Family Medicine

## 2019-08-26 DIAGNOSIS — N401 Enlarged prostate with lower urinary tract symptoms: Secondary | ICD-10-CM

## 2019-08-26 DIAGNOSIS — N138 Other obstructive and reflux uropathy: Secondary | ICD-10-CM

## 2019-09-09 ENCOUNTER — Ambulatory Visit: Payer: Medicare Other

## 2019-09-09 DIAGNOSIS — N281 Cyst of kidney, acquired: Secondary | ICD-10-CM | POA: Diagnosis not present

## 2019-09-09 DIAGNOSIS — C9001 Multiple myeloma in remission: Secondary | ICD-10-CM | POA: Diagnosis not present

## 2019-09-09 DIAGNOSIS — R0781 Pleurodynia: Secondary | ICD-10-CM | POA: Diagnosis not present

## 2019-09-09 DIAGNOSIS — R531 Weakness: Secondary | ICD-10-CM | POA: Diagnosis not present

## 2019-09-09 DIAGNOSIS — G629 Polyneuropathy, unspecified: Secondary | ICD-10-CM | POA: Diagnosis not present

## 2019-09-09 DIAGNOSIS — N179 Acute kidney failure, unspecified: Secondary | ICD-10-CM | POA: Diagnosis not present

## 2019-09-16 ENCOUNTER — Ambulatory Visit (INDEPENDENT_AMBULATORY_CARE_PROVIDER_SITE_OTHER): Payer: Medicare Other

## 2019-09-16 VITALS — Ht 72.0 in | Wt 288.0 lb

## 2019-09-16 DIAGNOSIS — Z0000 Encounter for general adult medical examination without abnormal findings: Secondary | ICD-10-CM

## 2019-09-16 NOTE — Progress Notes (Signed)
Subjective:   Kyle Parker is a 64 y.o. male who presents for an Initial Medicare Annual Wellness Visit.  This visit is being conducted via phone call  - after an attmept to do on video chat - due to the COVID-19 pandemic. This patient has given me verbal consent via phone to conduct this visit, patient states they are participating from their home address. Some vital signs may be absent or patient reported.   Patient identification: identified by name, DOB, and current address.    Review of Systems   Cardiac Risk Factors include: advanced age (>55mn, >>5women);male gender;hypertension;dyslipidemia    Objective:    Today's Vitals   09/16/19 0923  Weight: 288 lb (130.6 kg)  Height: 6' (1.829 m)   Body mass index is 39.06 kg/m.  Advanced Directives 09/16/2019 08/05/2018 10/31/2017 04/11/2016 03/28/2016 03/21/2016 03/14/2016  Does Patient Have a Medical Advance Directive? No No No No No No No  Would patient like information on creating a medical advance directive? - No - Patient declined No - Patient declined No - patient declined information No - patient declined information - -    Current Medications (verified) Outpatient Encounter Medications as of 09/16/2019  Medication Sig  . acetaminophen (TYLENOL) 500 MG tablet Take 1,000 mg by mouth 2 (two) times daily.   .Marland Kitchenacyclovir (ZOVIRAX) 400 MG tablet Take 1 tablet (400 mg total) by mouth 2 (two) times daily.  .Marland Kitchenaspirin EC 81 MG tablet Take 162 mg by mouth daily.   . cyclobenzaprine (FLEXERIL) 5 MG tablet Take 5 mg by mouth 3 (three) times daily as needed.  . gabapentin (NEURONTIN) 300 MG capsule Take 300 mg by mouth 3 (three) times daily. 6035mthree times a day  . Magnesium 400 MG CAPS Take 400 mg by mouth 2 (two) times daily.   . Marland Kitchenmeprazole (PRILOSEC) 20 MG capsule Take 20 mg by mouth daily.   . Marland KitchenEVLIMID 10 MG capsule Take 10 mg by mouth daily. 3 weeks on, 1 week off  . tamsulosin (FLOMAX) 0.4 MG CAPS capsule TAKE 2 CAPSULES (0.8 MG  TOTAL) BY MOUTH DAILY AFTER BREAKFAST.  . Marland Kitchenitamin D, Ergocalciferol, (DRISDOL) 50000 units CAPS capsule Take 50,000 Units by mouth every Tuesday.   . Marland Kitchenpratropium (ATROVENT) 0.06 % nasal spray Place 2 sprays into both nostrils 4 (four) times daily. For up to 5-7 days then stop. (Patient not taking: Reported on 09/16/2019)  . [DISCONTINUED] Homeopathic Products (ZICAM COLD REMEDY PO) Take by mouth.   No facility-administered encounter medications on file as of 09/16/2019.    Allergies (verified) Patient has no known allergies.   History: Past Medical History:  Diagnosis Date  . Arthritis   . Compression fracture of cervical spine at C2-C3 level    Rods in neck. limited L/R movement  . Lytic bone lesions on xray   [facility]   Past Surgical History:  Procedure Laterality Date  . biopsy of the vertebral body     . COLONOSCOPY WITH PROPOFOL N/A 10/31/2017   Procedure: COLONOSCOPY WITH PROPOFOL;  Surgeon: VaLin LandsmanMD;  [place];  Laterality: N/A;  . CT GUIDED BONE BIOPSY  03/23/2016  . NECK SURGERY    . POLYPECTOMY  10/31/2017   Procedure: POLYPECTOMY;  Surgeon: VaLin LandsmanMD;  [place] [facility];;   Family History  Problem Relation Age of Onset  . Heart attack Father   .  Renal cancer Father   . CAD Mother   . Diabetes Mellitus II Mother   . Heart disease Mother   . Diabetes Mother   . Skin cancer Mother        non melanoma  . Renal Disease Mother        on dialysis   . Prostate cancer Neg Hx   . Colon cancer Neg Hx    Social History   Socioeconomic History  . Marital status: Married    Spouse name: Not on file  . Number of children: Not on file  . Years of education: Not on file  . Highest education level: 12th grade  Occupational History  . Occupation: disability   Tobacco Use  . Smoking status: Never Smoker  . Smokeless tobacco: Current User     Types: Chew  Substance and Sexual Activity  . Alcohol use: No    Alcohol/week: 0.0 standard drinks  . Drug use: No  . Sexual activity: Not Currently  Other Topics Concern  . Not on file  Social History Narrative  . Not on file   Social Determinants of Health   Financial Resource Strain:   . Difficulty of Paying Living Expenses: Not on file  Food Insecurity:   . Worried About Charity fundraiser in the Last Year: Not on file  . Ran Out of Food in the Last Year: Not on file  Transportation Needs:   . Lack of Transportation (Medical): Not on file  . Lack of Transportation (Non-Medical): Not on file  Physical Activity:   . Days of Exercise per Week: Not on file  . Minutes of Exercise per Session: Not on file  Stress:   . Feeling of Stress : Not on file  Social Connections:   . Frequency of Communication with Friends and Family: Not on file  . Frequency of Social Gatherings with Friends and Family: Not on file  . Attends Religious Services: Not on file  . Active Member of Clubs or Organizations: Not on file  . Attends Archivist Meetings: Not on file  . Marital Status: Not on file   Tobacco Counseling Ready to quit: Not Answered Counseling given: Not Answered   Clinical Intake:  Pre-visit preparation completed: Yes  Pain : No/denies pain     Nutritional Status: BMI > 30  Obese Nutritional Risks: None Diabetes: No  How often do you need to have someone help you when you read instructions, pamphlets, or other written materials from your doctor or pharmacy?: 1 - Never  Interpreter Needed?: No  Information entered by ::  ,LPN  Activities of Daily Living In your present state of health, do you have any difficulty performing the following activities: 09/16/2019  Hearing? N  Comment no hearing aids  Vision? N  Comment eyeglasses when driving, Dr.Woodard annually  Difficulty concentrating or making decisions? N  Walking or climbing stairs? Y   Dressing or bathing? N  Doing errands, shopping? N  Preparing Food and eating ? N  Using the Toilet? N  In the past six months, have you accidently leaked urine? N  Do you have problems with loss of bowel control? N  Managing your Medications? N  Managing your Finances? N  Housekeeping or managing your Housekeeping? N  Some recent data might be hidden     Immunizations and Health Maintenance Immunization History  Administered Date(s) Administered  . DTaP / HiB / IPV 12/25/2017, 02/19/2018, 07/10/2018  . Hepatitis B, adult 12/25/2017,  02/19/2018, 07/10/2018  . Influenza,inj,Quad PF,6+ Mos 08/04/2017, 09/13/2017, 08/23/2018  . Pneumococcal Conjugate-13 04/17/2017, 12/25/2017, 02/19/2018  . Pneumococcal Polysaccharide-23 07/10/2018  . Tdap 09/07/2017  . Zoster Recombinat (Shingrix) 01/21/2019, 03/11/2019   Health Maintenance Due  Topic Date Due  . INFLUENZA VACCINE  03/08/2019    Patient Care Team: Olin Hauser, DO as PCP - General (Family Medicine) Vella Redhead, MD as Referring Physician (Internal Medicine) Marveen Reeks, Desmond Lope, NP as Nurse Practitioner (Hematology and Oncology) [place], Adela Simmie Davies, MD as Referring Physician (Dermatology) Wonda Horner, MD as Referring Physician (Neurosurgery) Eulogio Bear, MD as Consulting Physician (Ophthalmology)  Indicate any recent Medical Services you may have received from other than [facility] providers in the past year (date may be approximate).    Assessment:   This is a routine wellness examination for [place].  Hearing/Vision screen No exam data present  Dietary issues and exercise activities discussed: Current Exercise Habits: The patient does not participate in regular exercise at present, Exercise limited by: None identified  Goals   None    Depression Screen PHQ 2/9 Scores 09/16/2019 10/11/2018 04/11/2018 02/14/2018  PHQ - 2 Score 0 0 0 0    Fall Risk Fall Risk  09/16/2019 10/11/2018 04/11/2018  02/14/2018 10/19/2017  Falls in the past year? 0 0 No No No  Number falls in past yr: 0 - - - -  Injury with Fall? 0 - - - -  Follow up - Falls evaluation completed - - -    FALL RISK PREVENTION PERTAINING TO THE HOME:  Any stairs in or around the home? Yes  If so, are there any without handrails? No   Home free of loose throw rugs in walkways, pet beds, electrical cords, etc? Yes  Adequate lighting in your home to reduce risk of falls? Yes   ASSISTIVE DEVICES UTILIZED TO PREVENT FALLS:  Life alert? No  Use of a cane, walker or w/c? No  Grab bars in the bathroom? No  Shower chair or bench in shower? No  Elevated toilet seat or a handicapped toilet? No    TIMED UP AND GO:  Unable to perform    Cognitive Function:        Screening Tests Health Maintenance  Topic Date Due  . INFLUENZA VACCINE  03/08/2019  . COLONOSCOPY  11/01/2022  . TETANUS/TDAP  09/08/2027  . Hepatitis C Screening  Completed  . HIV Screening  Completed    Qualifies for Shingles Vaccine? Yes  shingrix completed   Tdap: up to date   Flu Vaccine: Due for Flu vaccine.   Pneumococcal Vaccine: up to date  Covid-19: information provided   Cancer Screenings:  Colorectal Screening: Completed 2019. Repeat every 5 years  Lung Cancer Screening: (Low Dose CT Chest recommended if Age 29-80 years, 30 pack-year currently smoking OR have quit w/in 15years.) does not qualify.     Additional Screening:  Hepatitis C Screening: does not qualify  Vision Screening: Recommended annual ophthalmology exams for early detection of glaucoma and other disorders of the eye. Is the patient up to date with their annual eye exam?  Yes  Who is the provider or what is the name of the office in which the pt attends annual eye exams? Dr.Woodard    Dental Screening: Recommended annual dental exams for proper oral hygiene  Community Resource Referral:  CRR required this visit?  No        Plan:  I have personally  reviewed and addressed the Medicare  Annual Wellness questionnaire and have noted the following in the patient's chart:  A. Medical and social history B. Use of alcohol, tobacco or illicit drugs  C. Current medications and supplements D. Functional ability and status E.  Nutritional status F.  Physical activity G. Advance directives H. List of other physicians I.  Hospitalizations, surgeries, and ER visits in previous 12 months J.  [place] such as hearing and vision if needed, cognitive and depression L. Referrals and appointments   In addition, I have reviewed and discussed with patient certain preventive protocols, quality metrics, and best practice recommendations. A written personalized care plan for preventive services as well as general preventive health recommendations were provided to patient.   Signed,    Bevelyn Ngo, LPN   4/0/3524  Nurse Health Advisor   Nurse Notes: none

## 2019-09-16 NOTE — Patient Instructions (Signed)
Mr. Kyle Parker , Thank you for taking time to come for your Medicare Wellness Visit. I appreciate your ongoing commitment to your health goals. Please review the following plan we discussed and let me know if I can assist you in the future.   Screening recommendations/referrals: Colonoscopy: completed 2019, due 2024 Recommended yearly ophthalmology/optometry visit for glaucoma screening and checkup Recommended yearly dental visit for hygiene and checkup  Vaccinations: Influenza vaccine: due now  Pneumococcal vaccine: up to date  Tdap vaccine: up to date  Shingles vaccine: shingrix completed   Advanced directives: please pick up a copy of this information next time you are in the office   Conditions/risks identified: We are recommending the vaccine to everyone who has not had an allergic reaction to any of the components of the vaccine. If you have specific questions about the vaccine, please bring them up with your health care provider to discuss them.   We will likely not be getting the vaccine in the office for the first rounds of vaccinations. The way they are releasing the vaccines is going to be through the health systems (like [place], [place], [facility], [place]) or through your county health department.     You can get your vaccine by appointment only at [address] from 8-12am you can sign up by.texting "VACCINE" to [id number], by calling [phone], or by going to: [email or web address]  I recommend going to this website above to get on the waitlist.   You can get more information by going to: [email or web address]  Next appointment: Follow up in one year for your annual wellness visit   Preventive Care 40-64 Years, Male Preventive care refers to lifestyle choices and visits with your health care provider that can promote health and wellness. What does preventive care include?  A yearly physical exam. This is also called an annual well check.  Dental exams once or  twice a year.  Routine eye exams. Ask your health care provider how often you should have your eyes checked.  Personal lifestyle choices, including:  Daily care of your teeth and gums.  Regular physical activity.  Eating a healthy diet.  Avoiding tobacco and drug use.  Limiting alcohol use.  Practicing safe sex.  Taking low-dose aspirin every day starting at age 36. What happens during an annual well check? The services and screenings done by your health care provider during your annual well check will depend on your age, overall health, lifestyle risk factors, and family history of disease. Counseling  Your health care provider may ask you questions about your:  Alcohol use.  Tobacco use.  Drug use.  Emotional well-being.  Home and relationship well-being.  Sexual activity.  Eating habits.  Work and work Statistician. Screening  You may have the following tests or measurements:  Height, weight, and BMI.  Blood pressure.  Lipid and cholesterol levels. These may be checked every 5 years, or more frequently if you are over 21 years old.  Skin check.  Lung cancer screening. You may have this screening every year starting at age 23 if you have a 30-pack-year history of smoking and currently smoke or have quit within the past 15 years.  Fecal occult blood test (FOBT) of the stool. You may have this test every year starting at age 23.  Flexible sigmoidoscopy or colonoscopy. You may have a sigmoidoscopy every 5 years or a colonoscopy every 10 years starting at age 6.  Prostate cancer screening. Recommendations will vary depending on your family history and other  risks.  Hepatitis C blood test.  Hepatitis B blood test.  Sexually transmitted disease (STD) testing.  Diabetes screening. This is done by checking your blood sugar (glucose) after you have not eaten for a while (fasting). You may have this done every 1-3 years. Discuss your test results, treatment  options, and if necessary, the need for more tests with your health care provider. Vaccines  Your health care provider may recommend certain vaccines, such as:  Influenza vaccine. This is recommended every year.  Tetanus, diphtheria, and acellular pertussis (Tdap, Td) vaccine. You may need a Td booster every 10 years.  Zoster vaccine. You may need this after age 69.  Pneumococcal 13-valent conjugate (PCV13) vaccine. You may need this if you have certain conditions and have not been vaccinated.  Pneumococcal polysaccharide (PPSV23) vaccine. You may need one or two doses if you smoke cigarettes or if you have certain conditions. Talk to your health care provider about which screenings and vaccines you need and how often you need them. This information is not intended to replace advice given to you by your health care provider. Make sure you discuss any questions you have with your health care provider. Document Released: 08/20/2015 Document Revised: 04/12/2016 Document Reviewed: 05/25/2015 Elsevier Interactive Patient Education  2017 [employer] Prevention in the Home Falls can cause injuries. They can happen to people of all ages. There are many things you can do to make your home safe and to help prevent falls. What can I do on the outside of my home?  Regularly fix the edges of walkways and driveways and fix any cracks.  Remove anything that might make you trip as you walk through a door, such as a raised step or threshold.  Trim any bushes or trees on the path to your home.  Use bright outdoor lighting.  Clear any walking paths of anything that might make someone trip, such as rocks or tools.  Regularly check to see if handrails are loose or broken. Make sure that both sides of any steps have handrails.  Any raised decks and porches should have guardrails on the edges.  Have any leaves, snow, or ice cleared regularly.  Use sand or salt on walking paths during  winter.  Clean up any spills in your garage right away. This includes oil or grease spills. What can I do in the bathroom?  Use night lights.  Install grab bars by the toilet and in the tub and shower. Do not use towel bars as grab bars.  Use non-skid mats or decals in the tub or shower.  If you need to sit down in the shower, use a plastic, non-slip stool.  Keep the floor dry. Clean up any water that spills on the floor as soon as it happens.  Remove soap buildup in the tub or shower regularly.  Attach bath mats securely with double-sided non-slip rug tape.  Do not have throw rugs and other things on the floor that can make you trip. What can I do in the bedroom?  Use night lights.  Make sure that you have a light by your bed that is easy to reach.  Do not use any sheets or blankets that are too big for your bed. They should not hang down onto the floor.  Have a firm chair that has side arms. You can use this for support while you get dressed.  Do not have throw rugs and other things on the floor that can  make you trip. What can I do in the kitchen?  Clean up any spills right away.  Avoid walking on wet floors.  Keep items that you use a lot in easy-to-reach places.  If you need to reach something above you, use a strong step stool that has a grab bar.  Keep electrical cords out of the way.  Do not use floor polish or wax that makes floors slippery. If you must use wax, use non-skid floor wax.  Do not have throw rugs and other things on the floor that can make you trip. What can I do with my stairs?  Do not leave any items on the stairs.  Make sure that there are handrails on both sides of the stairs and use them. Fix handrails that are broken or loose. Make sure that handrails are as long as the stairways.  Check any carpeting to make sure that it is firmly attached to the stairs. Fix any carpet that is loose or worn.  Avoid having throw rugs at the top or  bottom of the stairs. If you do have throw rugs, attach them to the floor with carpet tape.  Make sure that you have a light switch at the top of the stairs and the bottom of the stairs. If you do not have them, ask someone to add them for you. What else can I do to help prevent falls?  Wear shoes that:  Do not have high heels.  Have rubber bottoms.  Are comfortable and fit you well.  Are closed at the toe. Do not wear sandals.  If you use a stepladder:  Make sure that it is fully opened. Do not climb a closed stepladder.  Make sure that both sides of the stepladder are locked into place.  Ask someone to hold it for you, if possible.  Clearly mark and make sure that you can see:  Any grab bars or handrails.  First and last steps.  Where the edge of each step is.  Use tools that help you move around (mobility aids) if they are needed. These include:  Canes.  Walkers.  Scooters.  Crutches.  Turn on the lights when you go into a dark area. Replace any light bulbs as soon as they burn out.  Set up your furniture so you have a clear path. Avoid moving your furniture around.  If any of your floors are uneven, fix them.  If there are any pets around you, be aware of where they are.  Review your medicines with your doctor. Some medicines can make you feel dizzy. This can increase your chance of falling. Ask your doctor what other things that you can do to help prevent falls. This information is not intended to replace advice given to you by your health care provider. Make sure you discuss any questions you have with your health care provider. Document Released: 05/20/2009 Document Revised: 12/30/2015 Document Reviewed: 08/28/2014 Elsevier Interactive Patient Education  2017 [employer].

## 2019-10-01 DIAGNOSIS — C44329 Squamous cell carcinoma of skin of other parts of face: Secondary | ICD-10-CM | POA: Diagnosis not present

## 2019-10-01 DIAGNOSIS — D485 Neoplasm of uncertain behavior of skin: Secondary | ICD-10-CM | POA: Diagnosis not present

## 2019-10-01 DIAGNOSIS — L570 Actinic keratosis: Secondary | ICD-10-CM | POA: Diagnosis not present

## 2019-10-01 DIAGNOSIS — L814 Other melanin hyperpigmentation: Secondary | ICD-10-CM | POA: Diagnosis not present

## 2019-10-01 DIAGNOSIS — C9001 Multiple myeloma in remission: Secondary | ICD-10-CM | POA: Diagnosis not present

## 2019-10-01 DIAGNOSIS — C44229 Squamous cell carcinoma of skin of left ear and external auricular canal: Secondary | ICD-10-CM | POA: Diagnosis not present

## 2019-10-01 DIAGNOSIS — R21 Rash and other nonspecific skin eruption: Secondary | ICD-10-CM | POA: Diagnosis not present

## 2019-10-01 DIAGNOSIS — C4402 Squamous cell carcinoma of skin of lip: Secondary | ICD-10-CM | POA: Diagnosis not present

## 2019-10-01 DIAGNOSIS — Z85828 Personal history of other malignant neoplasm of skin: Secondary | ICD-10-CM | POA: Diagnosis not present

## 2019-10-06 DIAGNOSIS — H903 Sensorineural hearing loss, bilateral: Secondary | ICD-10-CM | POA: Diagnosis not present

## 2019-10-06 DIAGNOSIS — H6122 Impacted cerumen, left ear: Secondary | ICD-10-CM | POA: Diagnosis not present

## 2019-10-07 DIAGNOSIS — C9001 Multiple myeloma in remission: Secondary | ICD-10-CM | POA: Diagnosis not present

## 2019-10-19 DIAGNOSIS — C4402 Squamous cell carcinoma of skin of lip: Secondary | ICD-10-CM | POA: Diagnosis not present

## 2019-10-21 DIAGNOSIS — C44329 Squamous cell carcinoma of skin of other parts of face: Secondary | ICD-10-CM | POA: Diagnosis not present

## 2019-10-21 DIAGNOSIS — C4402 Squamous cell carcinoma of skin of lip: Secondary | ICD-10-CM | POA: Diagnosis not present

## 2019-10-28 DIAGNOSIS — C44229 Squamous cell carcinoma of skin of left ear and external auricular canal: Secondary | ICD-10-CM | POA: Diagnosis not present

## 2019-10-28 DIAGNOSIS — C4402 Squamous cell carcinoma of skin of lip: Secondary | ICD-10-CM | POA: Diagnosis not present

## 2019-11-11 DIAGNOSIS — C44229 Squamous cell carcinoma of skin of left ear and external auricular canal: Secondary | ICD-10-CM | POA: Diagnosis not present

## 2019-11-11 DIAGNOSIS — C9001 Multiple myeloma in remission: Secondary | ICD-10-CM | POA: Diagnosis not present

## 2019-12-03 DIAGNOSIS — B078 Other viral warts: Secondary | ICD-10-CM | POA: Diagnosis not present

## 2019-12-03 DIAGNOSIS — L570 Actinic keratosis: Secondary | ICD-10-CM | POA: Diagnosis not present

## 2019-12-03 DIAGNOSIS — Z85828 Personal history of other malignant neoplasm of skin: Secondary | ICD-10-CM | POA: Diagnosis not present

## 2019-12-03 DIAGNOSIS — Z08 Encounter for follow-up examination after completed treatment for malignant neoplasm: Secondary | ICD-10-CM | POA: Diagnosis not present

## 2019-12-09 DIAGNOSIS — G620 Drug-induced polyneuropathy: Secondary | ICD-10-CM | POA: Diagnosis not present

## 2019-12-09 DIAGNOSIS — C9001 Multiple myeloma in remission: Secondary | ICD-10-CM | POA: Diagnosis not present

## 2019-12-09 DIAGNOSIS — C9000 Multiple myeloma not having achieved remission: Secondary | ICD-10-CM | POA: Diagnosis not present

## 2020-01-06 DIAGNOSIS — C9001 Multiple myeloma in remission: Secondary | ICD-10-CM | POA: Diagnosis not present

## 2020-01-22 DIAGNOSIS — C9002 Multiple myeloma in relapse: Secondary | ICD-10-CM | POA: Diagnosis not present

## 2020-01-27 DIAGNOSIS — D619 Aplastic anemia, unspecified: Secondary | ICD-10-CM | POA: Diagnosis not present

## 2020-01-27 DIAGNOSIS — C9002 Multiple myeloma in relapse: Secondary | ICD-10-CM | POA: Diagnosis not present

## 2020-01-27 DIAGNOSIS — C9000 Multiple myeloma not having achieved remission: Secondary | ICD-10-CM | POA: Diagnosis not present

## 2020-02-06 DIAGNOSIS — C9001 Multiple myeloma in remission: Secondary | ICD-10-CM | POA: Diagnosis not present

## 2020-02-17 DIAGNOSIS — C9001 Multiple myeloma in remission: Secondary | ICD-10-CM | POA: Diagnosis not present

## 2020-02-17 DIAGNOSIS — Z5181 Encounter for therapeutic drug level monitoring: Secondary | ICD-10-CM | POA: Diagnosis not present

## 2020-02-17 DIAGNOSIS — Z79899 Other long term (current) drug therapy: Secondary | ICD-10-CM | POA: Diagnosis not present

## 2020-02-23 DIAGNOSIS — I77819 Aortic ectasia, unspecified site: Secondary | ICD-10-CM | POA: Diagnosis not present

## 2020-02-23 DIAGNOSIS — Z5181 Encounter for therapeutic drug level monitoring: Secondary | ICD-10-CM | POA: Diagnosis not present

## 2020-02-23 DIAGNOSIS — C9001 Multiple myeloma in remission: Secondary | ICD-10-CM | POA: Diagnosis not present

## 2020-02-23 DIAGNOSIS — Z79899 Other long term (current) drug therapy: Secondary | ICD-10-CM | POA: Diagnosis not present

## 2020-02-23 DIAGNOSIS — I517 Cardiomegaly: Secondary | ICD-10-CM | POA: Diagnosis not present

## 2020-02-25 DIAGNOSIS — Z79899 Other long term (current) drug therapy: Secondary | ICD-10-CM | POA: Diagnosis not present

## 2020-02-25 DIAGNOSIS — Z5112 Encounter for antineoplastic immunotherapy: Secondary | ICD-10-CM | POA: Diagnosis not present

## 2020-02-25 DIAGNOSIS — Z5181 Encounter for therapeutic drug level monitoring: Secondary | ICD-10-CM | POA: Diagnosis not present

## 2020-02-25 DIAGNOSIS — C9000 Multiple myeloma not having achieved remission: Secondary | ICD-10-CM | POA: Diagnosis not present

## 2020-02-26 DIAGNOSIS — Z5181 Encounter for therapeutic drug level monitoring: Secondary | ICD-10-CM | POA: Diagnosis not present

## 2020-02-26 DIAGNOSIS — C9002 Multiple myeloma in relapse: Secondary | ICD-10-CM | POA: Diagnosis not present

## 2020-02-26 DIAGNOSIS — Z5111 Encounter for antineoplastic chemotherapy: Secondary | ICD-10-CM | POA: Diagnosis not present

## 2020-02-26 DIAGNOSIS — Z79899 Other long term (current) drug therapy: Secondary | ICD-10-CM | POA: Diagnosis not present

## 2020-03-03 DIAGNOSIS — Z5111 Encounter for antineoplastic chemotherapy: Secondary | ICD-10-CM | POA: Diagnosis not present

## 2020-03-03 DIAGNOSIS — Z79899 Other long term (current) drug therapy: Secondary | ICD-10-CM | POA: Diagnosis not present

## 2020-03-03 DIAGNOSIS — Z5112 Encounter for antineoplastic immunotherapy: Secondary | ICD-10-CM | POA: Diagnosis not present

## 2020-03-03 DIAGNOSIS — C9000 Multiple myeloma not having achieved remission: Secondary | ICD-10-CM | POA: Diagnosis not present

## 2020-03-04 DIAGNOSIS — Z79899 Other long term (current) drug therapy: Secondary | ICD-10-CM | POA: Diagnosis not present

## 2020-03-04 DIAGNOSIS — C9000 Multiple myeloma not having achieved remission: Secondary | ICD-10-CM | POA: Diagnosis not present

## 2020-03-04 DIAGNOSIS — Z5181 Encounter for therapeutic drug level monitoring: Secondary | ICD-10-CM | POA: Diagnosis not present

## 2020-03-04 DIAGNOSIS — Z5111 Encounter for antineoplastic chemotherapy: Secondary | ICD-10-CM | POA: Diagnosis not present

## 2020-03-10 DIAGNOSIS — C9000 Multiple myeloma not having achieved remission: Secondary | ICD-10-CM | POA: Diagnosis not present

## 2020-03-10 DIAGNOSIS — Z5111 Encounter for antineoplastic chemotherapy: Secondary | ICD-10-CM | POA: Diagnosis not present

## 2020-03-10 DIAGNOSIS — Z5112 Encounter for antineoplastic immunotherapy: Secondary | ICD-10-CM | POA: Diagnosis not present

## 2020-03-10 DIAGNOSIS — Z79899 Other long term (current) drug therapy: Secondary | ICD-10-CM | POA: Diagnosis not present

## 2020-03-11 DIAGNOSIS — C9000 Multiple myeloma not having achieved remission: Secondary | ICD-10-CM | POA: Diagnosis not present

## 2020-03-11 DIAGNOSIS — Z5111 Encounter for antineoplastic chemotherapy: Secondary | ICD-10-CM | POA: Diagnosis not present

## 2020-03-11 DIAGNOSIS — Z9484 Stem cells transplant status: Secondary | ICD-10-CM | POA: Diagnosis not present

## 2020-03-17 DIAGNOSIS — Z79899 Other long term (current) drug therapy: Secondary | ICD-10-CM | POA: Diagnosis not present

## 2020-03-17 DIAGNOSIS — C9000 Multiple myeloma not having achieved remission: Secondary | ICD-10-CM | POA: Diagnosis not present

## 2020-03-17 DIAGNOSIS — Z5181 Encounter for therapeutic drug level monitoring: Secondary | ICD-10-CM | POA: Diagnosis not present

## 2020-03-17 DIAGNOSIS — Z5112 Encounter for antineoplastic immunotherapy: Secondary | ICD-10-CM | POA: Diagnosis not present

## 2020-03-24 DIAGNOSIS — Z79899 Other long term (current) drug therapy: Secondary | ICD-10-CM | POA: Diagnosis not present

## 2020-03-24 DIAGNOSIS — G629 Polyneuropathy, unspecified: Secondary | ICD-10-CM | POA: Diagnosis not present

## 2020-03-24 DIAGNOSIS — R768 Other specified abnormal immunological findings in serum: Secondary | ICD-10-CM | POA: Diagnosis not present

## 2020-03-24 DIAGNOSIS — R0609 Other forms of dyspnea: Secondary | ICD-10-CM | POA: Diagnosis not present

## 2020-03-24 DIAGNOSIS — C9001 Multiple myeloma in remission: Secondary | ICD-10-CM | POA: Diagnosis not present

## 2020-03-24 DIAGNOSIS — C9000 Multiple myeloma not having achieved remission: Secondary | ICD-10-CM | POA: Diagnosis not present

## 2020-03-24 DIAGNOSIS — Z5111 Encounter for antineoplastic chemotherapy: Secondary | ICD-10-CM | POA: Diagnosis not present

## 2020-03-24 DIAGNOSIS — G620 Drug-induced polyneuropathy: Secondary | ICD-10-CM | POA: Diagnosis not present

## 2020-03-24 DIAGNOSIS — Z981 Arthrodesis status: Secondary | ICD-10-CM | POA: Diagnosis not present

## 2020-03-24 DIAGNOSIS — Z85828 Personal history of other malignant neoplasm of skin: Secondary | ICD-10-CM | POA: Diagnosis not present

## 2020-03-24 DIAGNOSIS — D892 Hypergammaglobulinemia, unspecified: Secondary | ICD-10-CM | POA: Diagnosis not present

## 2020-03-24 DIAGNOSIS — Z5112 Encounter for antineoplastic immunotherapy: Secondary | ICD-10-CM | POA: Diagnosis not present

## 2020-03-24 DIAGNOSIS — Z5181 Encounter for therapeutic drug level monitoring: Secondary | ICD-10-CM | POA: Diagnosis not present

## 2020-03-24 DIAGNOSIS — R531 Weakness: Secondary | ICD-10-CM | POA: Diagnosis not present

## 2020-03-24 DIAGNOSIS — N281 Cyst of kidney, acquired: Secondary | ICD-10-CM | POA: Diagnosis not present

## 2020-03-24 DIAGNOSIS — R5383 Other fatigue: Secondary | ICD-10-CM | POA: Diagnosis not present

## 2020-03-24 DIAGNOSIS — I517 Cardiomegaly: Secondary | ICD-10-CM | POA: Diagnosis not present

## 2020-03-24 DIAGNOSIS — N179 Acute kidney failure, unspecified: Secondary | ICD-10-CM | POA: Diagnosis not present

## 2020-03-24 DIAGNOSIS — M25511 Pain in right shoulder: Secondary | ICD-10-CM | POA: Diagnosis not present

## 2020-03-24 DIAGNOSIS — I77819 Aortic ectasia, unspecified site: Secondary | ICD-10-CM | POA: Diagnosis not present

## 2020-03-25 DIAGNOSIS — Z79899 Other long term (current) drug therapy: Secondary | ICD-10-CM | POA: Diagnosis not present

## 2020-03-25 DIAGNOSIS — Z5181 Encounter for therapeutic drug level monitoring: Secondary | ICD-10-CM | POA: Diagnosis not present

## 2020-03-25 DIAGNOSIS — Z5111 Encounter for antineoplastic chemotherapy: Secondary | ICD-10-CM | POA: Diagnosis not present

## 2020-03-25 DIAGNOSIS — C9000 Multiple myeloma not having achieved remission: Secondary | ICD-10-CM | POA: Diagnosis not present

## 2020-03-31 DIAGNOSIS — Z5111 Encounter for antineoplastic chemotherapy: Secondary | ICD-10-CM | POA: Diagnosis not present

## 2020-03-31 DIAGNOSIS — C9000 Multiple myeloma not having achieved remission: Secondary | ICD-10-CM | POA: Diagnosis not present

## 2020-03-31 DIAGNOSIS — Z5181 Encounter for therapeutic drug level monitoring: Secondary | ICD-10-CM | POA: Diagnosis not present

## 2020-03-31 DIAGNOSIS — Z79899 Other long term (current) drug therapy: Secondary | ICD-10-CM | POA: Diagnosis not present

## 2020-04-01 DIAGNOSIS — Z5111 Encounter for antineoplastic chemotherapy: Secondary | ICD-10-CM | POA: Diagnosis not present

## 2020-04-01 DIAGNOSIS — C9000 Multiple myeloma not having achieved remission: Secondary | ICD-10-CM | POA: Diagnosis not present

## 2020-04-01 DIAGNOSIS — Z5181 Encounter for therapeutic drug level monitoring: Secondary | ICD-10-CM | POA: Diagnosis not present

## 2020-04-01 DIAGNOSIS — Z79899 Other long term (current) drug therapy: Secondary | ICD-10-CM | POA: Diagnosis not present

## 2020-04-07 ENCOUNTER — Other Ambulatory Visit: Payer: Self-pay | Admitting: Family Medicine

## 2020-04-07 DIAGNOSIS — C9000 Multiple myeloma not having achieved remission: Secondary | ICD-10-CM | POA: Diagnosis not present

## 2020-04-07 DIAGNOSIS — B078 Other viral warts: Secondary | ICD-10-CM | POA: Diagnosis not present

## 2020-04-07 DIAGNOSIS — Z5111 Encounter for antineoplastic chemotherapy: Secondary | ICD-10-CM | POA: Diagnosis not present

## 2020-04-07 DIAGNOSIS — Z5112 Encounter for antineoplastic immunotherapy: Secondary | ICD-10-CM | POA: Diagnosis not present

## 2020-04-07 DIAGNOSIS — Z85828 Personal history of other malignant neoplasm of skin: Secondary | ICD-10-CM | POA: Diagnosis not present

## 2020-04-07 DIAGNOSIS — Z79899 Other long term (current) drug therapy: Secondary | ICD-10-CM | POA: Diagnosis not present

## 2020-04-07 DIAGNOSIS — N138 Other obstructive and reflux uropathy: Secondary | ICD-10-CM

## 2020-04-08 DIAGNOSIS — Z79899 Other long term (current) drug therapy: Secondary | ICD-10-CM | POA: Diagnosis not present

## 2020-04-08 DIAGNOSIS — Z5181 Encounter for therapeutic drug level monitoring: Secondary | ICD-10-CM | POA: Diagnosis not present

## 2020-04-08 DIAGNOSIS — C9000 Multiple myeloma not having achieved remission: Secondary | ICD-10-CM | POA: Diagnosis not present

## 2020-04-08 DIAGNOSIS — Z5111 Encounter for antineoplastic chemotherapy: Secondary | ICD-10-CM | POA: Diagnosis not present

## 2020-04-20 DIAGNOSIS — C44229 Squamous cell carcinoma of skin of left ear and external auricular canal: Secondary | ICD-10-CM | POA: Diagnosis not present

## 2020-04-21 DIAGNOSIS — G629 Polyneuropathy, unspecified: Secondary | ICD-10-CM | POA: Diagnosis not present

## 2020-04-21 DIAGNOSIS — C9001 Multiple myeloma in remission: Secondary | ICD-10-CM | POA: Diagnosis not present

## 2020-04-21 DIAGNOSIS — N179 Acute kidney failure, unspecified: Secondary | ICD-10-CM | POA: Diagnosis not present

## 2020-04-21 DIAGNOSIS — N281 Cyst of kidney, acquired: Secondary | ICD-10-CM | POA: Diagnosis not present

## 2020-04-21 DIAGNOSIS — Z85828 Personal history of other malignant neoplasm of skin: Secondary | ICD-10-CM | POA: Diagnosis not present

## 2020-04-21 DIAGNOSIS — Z7982 Long term (current) use of aspirin: Secondary | ICD-10-CM | POA: Diagnosis not present

## 2020-04-21 DIAGNOSIS — R0600 Dyspnea, unspecified: Secondary | ICD-10-CM | POA: Diagnosis not present

## 2020-04-21 DIAGNOSIS — Z79899 Other long term (current) drug therapy: Secondary | ICD-10-CM | POA: Diagnosis not present

## 2020-04-21 DIAGNOSIS — R0609 Other forms of dyspnea: Secondary | ICD-10-CM | POA: Diagnosis not present

## 2020-04-21 DIAGNOSIS — G620 Drug-induced polyneuropathy: Secondary | ICD-10-CM | POA: Diagnosis not present

## 2020-04-21 DIAGNOSIS — B079 Viral wart, unspecified: Secondary | ICD-10-CM | POA: Diagnosis not present

## 2020-04-21 DIAGNOSIS — R531 Weakness: Secondary | ICD-10-CM | POA: Diagnosis not present

## 2020-04-21 DIAGNOSIS — C9000 Multiple myeloma not having achieved remission: Secondary | ICD-10-CM | POA: Diagnosis not present

## 2020-04-21 DIAGNOSIS — Z9484 Stem cells transplant status: Secondary | ICD-10-CM | POA: Diagnosis not present

## 2020-04-21 DIAGNOSIS — L570 Actinic keratosis: Secondary | ICD-10-CM | POA: Diagnosis not present

## 2020-04-21 DIAGNOSIS — R5383 Other fatigue: Secondary | ICD-10-CM | POA: Diagnosis not present

## 2020-04-21 DIAGNOSIS — R3915 Urgency of urination: Secondary | ICD-10-CM | POA: Diagnosis not present

## 2020-04-21 DIAGNOSIS — R350 Frequency of micturition: Secondary | ICD-10-CM | POA: Diagnosis not present

## 2020-04-21 DIAGNOSIS — Z981 Arthrodesis status: Secondary | ICD-10-CM | POA: Diagnosis not present

## 2020-04-21 DIAGNOSIS — R200 Anesthesia of skin: Secondary | ICD-10-CM | POA: Diagnosis not present

## 2020-04-21 DIAGNOSIS — Z5181 Encounter for therapeutic drug level monitoring: Secondary | ICD-10-CM | POA: Diagnosis not present

## 2020-04-22 DIAGNOSIS — Z5112 Encounter for antineoplastic immunotherapy: Secondary | ICD-10-CM | POA: Diagnosis not present

## 2020-04-22 DIAGNOSIS — Z5111 Encounter for antineoplastic chemotherapy: Secondary | ICD-10-CM | POA: Diagnosis not present

## 2020-04-22 DIAGNOSIS — C9000 Multiple myeloma not having achieved remission: Secondary | ICD-10-CM | POA: Diagnosis not present

## 2020-04-22 DIAGNOSIS — Z79899 Other long term (current) drug therapy: Secondary | ICD-10-CM | POA: Diagnosis not present

## 2020-04-28 DIAGNOSIS — Z5111 Encounter for antineoplastic chemotherapy: Secondary | ICD-10-CM | POA: Diagnosis not present

## 2020-04-28 DIAGNOSIS — Z5181 Encounter for therapeutic drug level monitoring: Secondary | ICD-10-CM | POA: Diagnosis not present

## 2020-04-28 DIAGNOSIS — Z79899 Other long term (current) drug therapy: Secondary | ICD-10-CM | POA: Diagnosis not present

## 2020-04-28 DIAGNOSIS — C9000 Multiple myeloma not having achieved remission: Secondary | ICD-10-CM | POA: Diagnosis not present

## 2020-05-06 DIAGNOSIS — Z5181 Encounter for therapeutic drug level monitoring: Secondary | ICD-10-CM | POA: Diagnosis not present

## 2020-05-06 DIAGNOSIS — C9000 Multiple myeloma not having achieved remission: Secondary | ICD-10-CM | POA: Diagnosis not present

## 2020-05-06 DIAGNOSIS — Z5112 Encounter for antineoplastic immunotherapy: Secondary | ICD-10-CM | POA: Diagnosis not present

## 2020-05-06 DIAGNOSIS — Z5111 Encounter for antineoplastic chemotherapy: Secondary | ICD-10-CM | POA: Diagnosis not present

## 2020-05-06 DIAGNOSIS — Z79899 Other long term (current) drug therapy: Secondary | ICD-10-CM | POA: Diagnosis not present

## 2020-05-20 DIAGNOSIS — C9000 Multiple myeloma not having achieved remission: Secondary | ICD-10-CM | POA: Diagnosis not present

## 2020-05-20 DIAGNOSIS — Z5111 Encounter for antineoplastic chemotherapy: Secondary | ICD-10-CM | POA: Diagnosis not present

## 2020-05-20 DIAGNOSIS — Z79899 Other long term (current) drug therapy: Secondary | ICD-10-CM | POA: Diagnosis not present

## 2020-05-20 DIAGNOSIS — Z5112 Encounter for antineoplastic immunotherapy: Secondary | ICD-10-CM | POA: Diagnosis not present

## 2020-05-27 DIAGNOSIS — Z5111 Encounter for antineoplastic chemotherapy: Secondary | ICD-10-CM | POA: Diagnosis not present

## 2020-05-27 DIAGNOSIS — Z79899 Other long term (current) drug therapy: Secondary | ICD-10-CM | POA: Diagnosis not present

## 2020-05-27 DIAGNOSIS — C9000 Multiple myeloma not having achieved remission: Secondary | ICD-10-CM | POA: Diagnosis not present

## 2020-05-27 DIAGNOSIS — Z5181 Encounter for therapeutic drug level monitoring: Secondary | ICD-10-CM | POA: Diagnosis not present

## 2020-06-03 DIAGNOSIS — C9000 Multiple myeloma not having achieved remission: Secondary | ICD-10-CM | POA: Diagnosis not present

## 2020-06-03 DIAGNOSIS — Z5181 Encounter for therapeutic drug level monitoring: Secondary | ICD-10-CM | POA: Diagnosis not present

## 2020-06-03 DIAGNOSIS — Z79899 Other long term (current) drug therapy: Secondary | ICD-10-CM | POA: Diagnosis not present

## 2020-06-03 DIAGNOSIS — Z5111 Encounter for antineoplastic chemotherapy: Secondary | ICD-10-CM | POA: Diagnosis not present

## 2020-06-03 DIAGNOSIS — Z5112 Encounter for antineoplastic immunotherapy: Secondary | ICD-10-CM | POA: Diagnosis not present

## 2020-06-17 DIAGNOSIS — C9000 Multiple myeloma not having achieved remission: Secondary | ICD-10-CM | POA: Diagnosis not present

## 2020-06-17 DIAGNOSIS — R197 Diarrhea, unspecified: Secondary | ICD-10-CM | POA: Diagnosis not present

## 2020-06-17 DIAGNOSIS — G629 Polyneuropathy, unspecified: Secondary | ICD-10-CM | POA: Diagnosis not present

## 2020-06-17 DIAGNOSIS — R5383 Other fatigue: Secondary | ICD-10-CM | POA: Diagnosis not present

## 2020-06-17 DIAGNOSIS — N281 Cyst of kidney, acquired: Secondary | ICD-10-CM | POA: Diagnosis not present

## 2020-06-17 DIAGNOSIS — Z5181 Encounter for therapeutic drug level monitoring: Secondary | ICD-10-CM | POA: Diagnosis not present

## 2020-06-17 DIAGNOSIS — R29898 Other symptoms and signs involving the musculoskeletal system: Secondary | ICD-10-CM | POA: Diagnosis not present

## 2020-06-17 DIAGNOSIS — Z5111 Encounter for antineoplastic chemotherapy: Secondary | ICD-10-CM | POA: Diagnosis not present

## 2020-06-17 DIAGNOSIS — Z85828 Personal history of other malignant neoplasm of skin: Secondary | ICD-10-CM | POA: Diagnosis not present

## 2020-06-17 DIAGNOSIS — Z79899 Other long term (current) drug therapy: Secondary | ICD-10-CM | POA: Diagnosis not present

## 2020-06-17 DIAGNOSIS — Z5112 Encounter for antineoplastic immunotherapy: Secondary | ICD-10-CM | POA: Diagnosis not present

## 2020-06-17 DIAGNOSIS — C9001 Multiple myeloma in remission: Secondary | ICD-10-CM | POA: Diagnosis not present

## 2020-06-17 DIAGNOSIS — N179 Acute kidney failure, unspecified: Secondary | ICD-10-CM | POA: Diagnosis not present

## 2020-06-22 ENCOUNTER — Other Ambulatory Visit: Payer: Self-pay | Admitting: Family Medicine

## 2020-06-22 DIAGNOSIS — N138 Other obstructive and reflux uropathy: Secondary | ICD-10-CM

## 2020-06-22 DIAGNOSIS — N401 Enlarged prostate with lower urinary tract symptoms: Secondary | ICD-10-CM

## 2020-06-22 NOTE — Telephone Encounter (Signed)
Requested Prescriptions  Pending Prescriptions Disp Refills  . tamsulosin (FLOMAX) 0.4 MG CAPS capsule [Pharmacy Med Name: TAMSULOSIN HCL 0.4 MG CAPSULE] 180 capsule 0    Sig: TAKE 2 CAPSULES (0.8 MG TOTAL) BY MOUTH DAILY AFTER BREAKFAST.     Urology: Alpha-Adrenergic Blocker Passed - 06/22/2020  4:47 PM      Passed - Last BP in normal range    BP Readings from Last 1 Encounters:  02/18/19 108/72         Passed - Valid encounter within last 12 months    Recent Outpatient Visits          10 months ago Acute non-recurrent maxillary sinusitis   [place], DO   1 year ago Acute rhinosinusitis   [facility], Devonne Doughty, DO   1 year ago Pneumonia of right lower lobe due to infectious organism [facility])   [place], DO   2 years ago Allergic sinusitis   [place], DO   2 years ago Acute right-sided low back pain with right-sided sciatica   [place], DO

## 2020-06-25 DIAGNOSIS — Z23 Encounter for immunization: Secondary | ICD-10-CM | POA: Diagnosis not present

## 2020-06-25 DIAGNOSIS — C9000 Multiple myeloma not having achieved remission: Secondary | ICD-10-CM | POA: Diagnosis not present

## 2020-06-25 DIAGNOSIS — Z792 Long term (current) use of antibiotics: Secondary | ICD-10-CM | POA: Diagnosis not present

## 2020-06-25 DIAGNOSIS — Z79899 Other long term (current) drug therapy: Secondary | ICD-10-CM | POA: Diagnosis not present

## 2020-06-25 DIAGNOSIS — D849 Immunodeficiency, unspecified: Secondary | ICD-10-CM | POA: Diagnosis not present

## 2020-07-13 DIAGNOSIS — Z5112 Encounter for antineoplastic immunotherapy: Secondary | ICD-10-CM | POA: Diagnosis not present

## 2020-07-13 DIAGNOSIS — Z7982 Long term (current) use of aspirin: Secondary | ICD-10-CM | POA: Diagnosis not present

## 2020-07-13 DIAGNOSIS — Z85828 Personal history of other malignant neoplasm of skin: Secondary | ICD-10-CM | POA: Diagnosis not present

## 2020-07-13 DIAGNOSIS — Z981 Arthrodesis status: Secondary | ICD-10-CM | POA: Diagnosis not present

## 2020-07-13 DIAGNOSIS — N281 Cyst of kidney, acquired: Secondary | ICD-10-CM | POA: Diagnosis not present

## 2020-07-13 DIAGNOSIS — C9000 Multiple myeloma not having achieved remission: Secondary | ICD-10-CM | POA: Diagnosis not present

## 2020-07-13 DIAGNOSIS — Z79899 Other long term (current) drug therapy: Secondary | ICD-10-CM | POA: Diagnosis not present

## 2020-07-13 DIAGNOSIS — N179 Acute kidney failure, unspecified: Secondary | ICD-10-CM | POA: Diagnosis not present

## 2020-07-13 DIAGNOSIS — R197 Diarrhea, unspecified: Secondary | ICD-10-CM | POA: Diagnosis not present

## 2020-07-13 DIAGNOSIS — Z5181 Encounter for therapeutic drug level monitoring: Secondary | ICD-10-CM | POA: Diagnosis not present

## 2020-07-13 DIAGNOSIS — R5383 Other fatigue: Secondary | ICD-10-CM | POA: Diagnosis not present

## 2020-07-13 DIAGNOSIS — Z5111 Encounter for antineoplastic chemotherapy: Secondary | ICD-10-CM | POA: Diagnosis not present

## 2020-07-13 DIAGNOSIS — R531 Weakness: Secondary | ICD-10-CM | POA: Diagnosis not present

## 2020-07-13 DIAGNOSIS — G629 Polyneuropathy, unspecified: Secondary | ICD-10-CM | POA: Diagnosis not present

## 2020-07-23 DIAGNOSIS — Z5181 Encounter for therapeutic drug level monitoring: Secondary | ICD-10-CM | POA: Diagnosis not present

## 2020-07-23 DIAGNOSIS — C9000 Multiple myeloma not having achieved remission: Secondary | ICD-10-CM | POA: Diagnosis not present

## 2020-07-23 DIAGNOSIS — R197 Diarrhea, unspecified: Secondary | ICD-10-CM | POA: Diagnosis not present

## 2020-07-23 DIAGNOSIS — Z79899 Other long term (current) drug therapy: Secondary | ICD-10-CM | POA: Diagnosis not present

## 2020-07-29 DIAGNOSIS — Z5112 Encounter for antineoplastic immunotherapy: Secondary | ICD-10-CM | POA: Diagnosis not present

## 2020-07-29 DIAGNOSIS — Z5111 Encounter for antineoplastic chemotherapy: Secondary | ICD-10-CM | POA: Diagnosis not present

## 2020-07-29 DIAGNOSIS — Z5181 Encounter for therapeutic drug level monitoring: Secondary | ICD-10-CM | POA: Diagnosis not present

## 2020-07-29 DIAGNOSIS — Z79899 Other long term (current) drug therapy: Secondary | ICD-10-CM | POA: Diagnosis not present

## 2020-07-29 DIAGNOSIS — C9000 Multiple myeloma not having achieved remission: Secondary | ICD-10-CM | POA: Diagnosis not present

## 2020-08-12 DIAGNOSIS — U071 COVID-19: Secondary | ICD-10-CM | POA: Diagnosis not present

## 2020-08-12 DIAGNOSIS — R058 Other specified cough: Secondary | ICD-10-CM | POA: Diagnosis not present

## 2020-08-12 DIAGNOSIS — Z79899 Other long term (current) drug therapy: Secondary | ICD-10-CM | POA: Diagnosis not present

## 2020-08-12 DIAGNOSIS — C9000 Multiple myeloma not having achieved remission: Secondary | ICD-10-CM | POA: Diagnosis not present

## 2020-08-12 DIAGNOSIS — Z5181 Encounter for therapeutic drug level monitoring: Secondary | ICD-10-CM | POA: Diagnosis not present

## 2020-08-12 DIAGNOSIS — Z20822 Contact with and (suspected) exposure to covid-19: Secondary | ICD-10-CM | POA: Diagnosis not present

## 2020-08-12 DIAGNOSIS — C9002 Multiple myeloma in relapse: Secondary | ICD-10-CM | POA: Diagnosis not present

## 2020-08-12 DIAGNOSIS — R0981 Nasal congestion: Secondary | ICD-10-CM | POA: Diagnosis not present

## 2020-08-31 ENCOUNTER — Other Ambulatory Visit: Payer: Self-pay | Admitting: Family Medicine

## 2020-08-31 DIAGNOSIS — N138 Other obstructive and reflux uropathy: Secondary | ICD-10-CM

## 2020-09-02 DIAGNOSIS — C9002 Multiple myeloma in relapse: Secondary | ICD-10-CM | POA: Diagnosis not present

## 2020-09-02 DIAGNOSIS — C9000 Multiple myeloma not having achieved remission: Secondary | ICD-10-CM | POA: Diagnosis not present

## 2020-09-02 DIAGNOSIS — Z79899 Other long term (current) drug therapy: Secondary | ICD-10-CM | POA: Diagnosis not present

## 2020-09-02 DIAGNOSIS — C9001 Multiple myeloma in remission: Secondary | ICD-10-CM | POA: Diagnosis not present

## 2020-09-02 DIAGNOSIS — M25562 Pain in left knee: Secondary | ICD-10-CM | POA: Diagnosis not present

## 2020-09-02 DIAGNOSIS — Z5111 Encounter for antineoplastic chemotherapy: Secondary | ICD-10-CM | POA: Diagnosis not present

## 2020-09-02 DIAGNOSIS — R0609 Other forms of dyspnea: Secondary | ICD-10-CM | POA: Diagnosis not present

## 2020-09-02 DIAGNOSIS — Z85828 Personal history of other malignant neoplasm of skin: Secondary | ICD-10-CM | POA: Diagnosis not present

## 2020-09-02 DIAGNOSIS — G8929 Other chronic pain: Secondary | ICD-10-CM | POA: Diagnosis not present

## 2020-09-02 DIAGNOSIS — Z5112 Encounter for antineoplastic immunotherapy: Secondary | ICD-10-CM | POA: Diagnosis not present

## 2020-09-02 DIAGNOSIS — I77819 Aortic ectasia, unspecified site: Secondary | ICD-10-CM | POA: Diagnosis not present

## 2020-09-02 DIAGNOSIS — R200 Anesthesia of skin: Secondary | ICD-10-CM | POA: Diagnosis not present

## 2020-09-02 DIAGNOSIS — R5383 Other fatigue: Secondary | ICD-10-CM | POA: Diagnosis not present

## 2020-09-02 DIAGNOSIS — M25561 Pain in right knee: Secondary | ICD-10-CM | POA: Diagnosis not present

## 2020-09-02 DIAGNOSIS — G629 Polyneuropathy, unspecified: Secondary | ICD-10-CM | POA: Diagnosis not present

## 2020-09-02 DIAGNOSIS — R531 Weakness: Secondary | ICD-10-CM | POA: Diagnosis not present

## 2020-09-02 DIAGNOSIS — Z5181 Encounter for therapeutic drug level monitoring: Secondary | ICD-10-CM | POA: Diagnosis not present

## 2020-09-02 DIAGNOSIS — Z7982 Long term (current) use of aspirin: Secondary | ICD-10-CM | POA: Diagnosis not present

## 2020-09-02 DIAGNOSIS — N281 Cyst of kidney, acquired: Secondary | ICD-10-CM | POA: Diagnosis not present

## 2020-09-02 DIAGNOSIS — N179 Acute kidney failure, unspecified: Secondary | ICD-10-CM | POA: Diagnosis not present

## 2020-09-02 DIAGNOSIS — Z981 Arthrodesis status: Secondary | ICD-10-CM | POA: Diagnosis not present

## 2020-09-07 ENCOUNTER — Other Ambulatory Visit: Payer: Self-pay | Admitting: Family Medicine

## 2020-09-07 DIAGNOSIS — N138 Other obstructive and reflux uropathy: Secondary | ICD-10-CM

## 2020-09-09 DIAGNOSIS — Z5181 Encounter for therapeutic drug level monitoring: Secondary | ICD-10-CM | POA: Diagnosis not present

## 2020-09-09 DIAGNOSIS — C9000 Multiple myeloma not having achieved remission: Secondary | ICD-10-CM | POA: Diagnosis not present

## 2020-09-09 DIAGNOSIS — Z79899 Other long term (current) drug therapy: Secondary | ICD-10-CM | POA: Diagnosis not present

## 2020-09-09 DIAGNOSIS — Z5111 Encounter for antineoplastic chemotherapy: Secondary | ICD-10-CM | POA: Diagnosis not present

## 2020-09-16 DIAGNOSIS — Z5112 Encounter for antineoplastic immunotherapy: Secondary | ICD-10-CM | POA: Diagnosis not present

## 2020-09-16 DIAGNOSIS — Z79899 Other long term (current) drug therapy: Secondary | ICD-10-CM | POA: Diagnosis not present

## 2020-09-16 DIAGNOSIS — C9000 Multiple myeloma not having achieved remission: Secondary | ICD-10-CM | POA: Diagnosis not present

## 2020-09-16 DIAGNOSIS — Z5181 Encounter for therapeutic drug level monitoring: Secondary | ICD-10-CM | POA: Diagnosis not present

## 2020-09-27 DIAGNOSIS — G8929 Other chronic pain: Secondary | ICD-10-CM | POA: Diagnosis not present

## 2020-09-27 DIAGNOSIS — M25561 Pain in right knee: Secondary | ICD-10-CM | POA: Diagnosis not present

## 2020-09-27 DIAGNOSIS — M25562 Pain in left knee: Secondary | ICD-10-CM | POA: Diagnosis not present

## 2020-09-28 ENCOUNTER — Ambulatory Visit (INDEPENDENT_AMBULATORY_CARE_PROVIDER_SITE_OTHER): Payer: Medicare Other

## 2020-09-28 VITALS — Ht 72.0 in | Wt 285.0 lb

## 2020-09-28 DIAGNOSIS — Z0000 Encounter for general adult medical examination without abnormal findings: Secondary | ICD-10-CM

## 2020-09-28 NOTE — Progress Notes (Signed)
I connected with [name] today by telephone and verified that I am speaking with the correct person using two identifiers. Location patient: home Location provider: work Persons participating in the virtual visit: Wright, Gravely LPN.   I discussed the limitations, risks, security and privacy concerns of performing an evaluation and management service by telephone and the availability of in person appointments. I also discussed with the patient that there may be a patient responsible charge related to this service. The patient expressed understanding and verbally consented to this telephonic visit.    Interactive audio and video telecommunications were attempted between this provider and patient, however failed, due to patient having technical difficulties OR patient did not have access to video capability.  We continued and completed visit with audio only.     Vital signs may be patient reported or missing.  Subjective:   Kyle Parker is a 65 y.o. male who presents for Medicare Annual/Subsequent preventive examination.  Review of Systems     Cardiac Risk Factors include: advanced age (>58mn, >>51women);male gender;obesity (BMI >30kg/m2);sedentary lifestyle;smoking/ tobacco exposure     Objective:    Today's Vitals   09/28/20 0816  Weight: 285 lb (129.3 kg)  Height: 6' (1.829 m)   Body mass index is 38.65 kg/m.  Advanced Directives 09/28/2020 09/16/2019 08/05/2018 10/31/2017 04/11/2016 03/28/2016 03/21/2016  Does Patient Have a Medical Advance Directive? No No No No No No No  Would patient like information on creating a medical advance directive? - - No - Patient declined No - Patient declined No - patient declined information No - patient declined information -    Current Medications (verified) Outpatient Encounter Medications as of 09/28/2020  Medication Sig  . acetaminophen (TYLENOL) 500 MG tablet Take 1,000 mg by mouth 2 (two) times daily.   . cyclobenzaprine  (FLEXERIL) 5 MG tablet Take 5 mg by mouth 3 (three) times daily as needed.  . Magnesium 400 MG CAPS Take 400 mg by mouth 2 (two) times daily.   .Marland Kitchenomeprazole (PRILOSEC) 20 MG capsule Take 20 mg by mouth daily.   . tamsulosin (FLOMAX) 0.4 MG CAPS capsule TAKE 2 CAPSULES (0.8 MG TOTAL) BY MOUTH DAILY AFTER BREAKFAST.  .Marland KitchenVitamin D, Ergocalciferol, (DRISDOL) 50000 units CAPS capsule Take 50,000 Units by mouth every Tuesday.   .Marland Kitchenaspirin EC 81 MG tablet Take 162 mg by mouth daily.  (Patient not taking: Reported on 09/28/2020)  . gabapentin (NEURONTIN) 300 MG capsule Take 300 mg by mouth 3 (three) times daily. 6062mthree times a day  . ipratropium (ATROVENT) 0.06 % nasal spray Place 2 sprays into both nostrils 4 (four) times daily. For up to 5-7 days then stop. (Patient not taking: No sig reported)  . REVLIMID 10 MG capsule Take 10 mg by mouth daily. 3 weeks on, 1 week off (Patient not taking: Reported on 09/28/2020)   No facility-administered encounter medications on file as of 09/28/2020.    Allergies (verified) Patient has no known allergies.   History: Past Medical History:  Diagnosis Date  . Arthritis   . Compression fracture of cervical spine at C2-C3 level    Rods in neck. limited L/R movement  . Lytic bone lesions on xray   . [facility]   Past Surgical History:  Procedure Laterality Date  . biopsy of the vertebral body     . COLONOSCOPY WITH PROPOFOL N/A 10/31/2017   Procedure: COLONOSCOPY WITH PROPOFOL;  Surgeon: VaLin LandsmanMD;  Location: [place];  Service: Endoscopy;  Laterality: N/A;  . CT GUIDED BONE BIOPSY  03/23/2016  . NECK SURGERY    . POLYPECTOMY  10/31/2017   Procedure: POLYPECTOMY;  Surgeon: Lin Landsman, MD;  Location: [place];  Service: Endoscopy;;   Family History  Problem Relation Age of Onset  . Heart attack Father   . Renal cancer Father   . CAD Mother   . Diabetes Mellitus II Mother   .  Heart disease Mother   . Diabetes Mother   . Skin cancer Mother        non melanoma  . Renal Disease Mother        on dialysis   . Prostate cancer Neg Hx   . Colon cancer Neg Hx    Social History   Socioeconomic History  . Marital status: Married    Spouse name: Not on file  . Number of children: Not on file  . Years of education: Not on file  . Highest education level: 12th grade  Occupational History  . Occupation: disability   Tobacco Use  . Smoking status: Never Smoker  . Smokeless tobacco: Current User    Types: Chew  Vaping Use  . Vaping Use: Never used  Substance and Sexual Activity  . Alcohol use: No    Alcohol/week: 0.0 standard drinks  . Drug use: No  . Sexual activity: Not Currently  Other Topics Concern  . Not on file  Social History Narrative  . Not on file   Social Determinants of Health   Financial Resource Strain: Low Risk   . Difficulty of Paying Living Expenses: Not hard at all  Food Insecurity: No Food Insecurity  . Worried About Charity fundraiser in the Last Year: Never true  . Ran Out of Food in the Last Year: Never true  Transportation Needs: No Transportation Needs  . Lack of Transportation (Medical): No  . Lack of Transportation (Non-Medical): No  Physical Activity: Inactive  . Days of Exercise per Week: 0 days  . Minutes of Exercise per Session: 0 min  Stress: No Stress Concern Present  . Feeling of Stress : Not at all  Social Connections: Not on file    Tobacco Counseling Ready to quit: Not Answered Counseling given: Not Answered   Clinical Intake:  Pre-visit preparation completed: Yes  Pain : No/denies pain     Nutritional Status: BMI > 30  Obese Nutritional Risks: None Diabetes: No  How often do you need to have someone help you when you read instructions, pamphlets, or other written materials from your doctor or pharmacy?: 1 - Never What is the last grade level you completed in school?: 12th grade  Diabetic?  no  Interpreter Needed?: No  Information entered by :: NAllen LPN   Activities of Daily Living In your present state of health, do you have any difficulty performing the following activities: 09/28/2020  Hearing? N  Vision? N  Difficulty concentrating or making decisions? N  Walking or climbing stairs? N  Dressing or bathing? N  Doing errands, shopping? N  Preparing Food and eating ? N  Using the Toilet? N  In the past six months, have you accidently leaked urine? N  Do you have problems with loss of bowel control? N  Managing your Medications? N  Managing your Finances? N  Housekeeping or managing your Housekeeping? N  Some recent data might be hidden    Patient Care Team: Nobie Putnam  J, DO as PCP - General (Family Medicine) Vella Redhead, MD as Referring Physician (Internal Medicine) Marveen Reeks, Desmond Lope, NP as Nurse Practitioner (Hematology and Oncology) L[place], Adela Simmie Davies, MD as Referring Physician (Dermatology) Wonda Horner, MD as Referring Physician (Neurosurgery)  Indicate any recent Medical Services you may have received from other than [facility] providers in the past year (date may be approximate).     Assessment:   This is a routine wellness examination for Kyle Parker.  Hearing/Vision screen  Hearing Screening   125Hz  250Hz  500Hz  1000Hz  2000Hz  3000Hz  4000Hz  6000Hz  8000Hz   Right ear:           Left ear:           Vision Screening Comments: Regular eye exams, Dr. Ellin Mayhew  Dietary issues and exercise activities discussed: Current Exercise Habits: The patient does not participate in regular exercise at present  Goals    . Patient Stated     09/28/2020, wants to lose 50 pounds      Depression Screen PHQ 2/9 Scores 09/28/2020 09/16/2019 10/11/2018 04/11/2018 02/14/2018 10/19/2017 09/07/2017  PHQ - 2 Score 0 0 0 0 0 0 0    Fall Risk Fall Risk  09/28/2020 09/16/2019 10/11/2018 04/11/2018 02/14/2018  Falls in the past year? 0 0 0 No No  Number falls in past  yr: - 0 - - -  Injury with Fall? - 0 - - -  Risk for fall due to : Medication side effect - - - -  Follow up Falls evaluation completed;Education provided;Falls prevention discussed - Falls evaluation completed - -    FALL RISK PREVENTION PERTAINING TO THE HOME:  Any stairs in or around the home? Yes  If so, are there any without handrails? No  Home free of loose throw rugs in walkways, pet beds, electrical cords, etc? Yes  Adequate lighting in your home to reduce risk of falls? Yes   ASSISTIVE DEVICES UTILIZED TO PREVENT FALLS:  Life alert? No  Use of a cane, walker or w/c? No  Grab bars in the bathroom? No  Shower chair or bench in shower? No  Elevated toilet seat or a handicapped toilet? Yes   TIMED UP AND GO:  Was the test performed? No   Cognitive Function:     6CIT Screen 09/28/2020  What Year? 0 points  What month? 0 points  What time? 0 points  Count back from 20 0 points  Months in reverse 4 points  Repeat phrase 2 points  Total Score 6    Immunizations Immunization History  Administered Date(s) Administered  . DTaP / HiB / IPV 12/25/2017, 02/19/2018, 07/10/2018  . Hepatitis B, adult 12/25/2017, 02/19/2018, 07/10/2018  . Influenza,inj,Quad PF,6+ Mos 08/04/2017, 09/13/2017, 08/23/2018  . Janssen (J&J) SARS-COV-2 Vaccination 11/13/2019  . PFIZER(Purple Top)SARS-COV-2 Vaccination 08/05/2020  . Pneumococcal Conjugate-13 04/17/2017, 12/25/2017, 02/19/2018  . Pneumococcal Polysaccharide-23 07/10/2018  . Tdap 09/07/2017  . Zoster Recombinat (Shingrix) 01/21/2019, 03/11/2019    TDAP status: Up to date  Flu Vaccine status: Up to date  Pneumococcal vaccine status: Up to date  Covid-19 vaccine status: Completed vaccines  Qualifies for Shingles Vaccine? Yes   Zostavax completed No   Shingrix Completed?: Yes  Screening Tests Health Maintenance  Topic Date Due  . COLONOSCOPY (Pts 45-41yr Insurance coverage will need to be confirmed)  11/01/2022  .  TETANUS/TDAP  09/08/2027  . INFLUENZA VACCINE  Completed  . COVID-19 Vaccine  Completed  . Hepatitis C Screening  Completed  . HIV Screening  Completed    Health Maintenance  There are no preventive care reminders to display for this patient.  Colorectal cancer screening: Type of screening: Colonoscopy. Completed 10/31/2017. Repeat every 5 years  Lung Cancer Screening: (Low Dose CT Chest recommended if Age 30-80 years, 30 pack-year currently smoking OR have quit w/in 15years.) does not qualify.   Lung Cancer Screening Referral: no  Additional Screening:  Hepatitis C Screening: does qualify; Completed 09/13/2017  Vision Screening: Recommended annual ophthalmology exams for early detection of glaucoma and other disorders of the eye. Is the patient up to date with their annual eye exam?  Yes  Who is the provider or what is the name of the office in which the patient attends annual eye exams? Dr. Ellin Mayhew If pt is not established with a provider, would they like to be referred to a provider to establish care? No .   Dental Screening: Recommended annual dental exams for proper oral hygiene  Community Resource Referral / Chronic Care Management: CRR required this visit?  No   CCM required this visit?  No      Plan:     I have personally reviewed and noted the following in the patient's chart:   . Medical and social history . Use of alcohol, tobacco or illicit drugs  . Current medications and supplements . Functional ability and status . Nutritional status . Physical activity . Advanced directives . List of other physicians . Hospitalizations, surgeries, and ER visits in previous 12 months . Vitals . Screenings to include cognitive, depression, and falls . Referrals and appointments  In addition, I have reviewed and discussed with patient certain preventive protocols, quality metrics, and best practice recommendations. A written personalized care plan for preventive services  as well as general preventive health recommendations were provided to patient.     Kellie Simmering, LPN   [id number]   Nurse Notes:

## 2020-09-28 NOTE — Patient Instructions (Signed)
Kyle Parker , Thank you for taking time to come for your Medicare Wellness Visit. I appreciate your ongoing commitment to your health goals. Please review the following plan we discussed and let me know if I can assist you in the future.   Screening recommendations/referrals: Colonoscopy: completed 10/31/2017, due 11/01/2022 Recommended yearly ophthalmology/optometry visit for glaucoma screening and checkup Recommended yearly dental visit for hygiene and checkup  Vaccinations: Influenza vaccine: completed 06/25/2020, due 03/07/2021 Pneumococcal vaccine: n/a Tdap vaccine: completed 09/07/2017, due 09/08/2027 Shingles vaccine: completed   Covid-19: 11/13/2019, 08/05/2020  Advanced directives: Advance directive discussed with you today.   Conditions/risks identified: chews tabacco  Next appointment: Follow up in one year for your annual wellness visit   Preventive Care 40-64 Years, Male Preventive care refers to lifestyle choices and visits with your health care provider that can promote health and wellness. What does preventive care include?  A yearly physical exam. This is also called an annual well check.  Dental exams once or twice a year.  Routine eye exams. Ask your health care provider how often you should have your eyes checked.  Personal lifestyle choices, including:  Daily care of your teeth and gums.  Regular physical activity.  Eating a healthy diet.  Avoiding tobacco and drug use.  Limiting alcohol use.  Practicing safe sex.  Taking low-dose aspirin every day starting at age 62. What happens during an annual well check? The services and screenings done by your health care provider during your annual well check will depend on your age, overall health, lifestyle risk factors, and family history of disease. Counseling  Your health care provider may ask you questions about your:  Alcohol use.  Tobacco use.  Drug use.  Emotional well-being.  Home and relationship  well-being.  Sexual activity.  Eating habits.  Work and work Statistician. Screening  You may have the following tests or measurements:  Height, weight, and BMI.  Blood pressure.  Lipid and cholesterol levels. These may be checked every 5 years, or more frequently if you are over 73 years old.  Skin check.  Lung cancer screening. You may have this screening every year starting at age 52 if you have a 30-pack-year history of smoking and currently smoke or have quit within the past 15 years.  Fecal occult blood test (FOBT) of the stool. You may have this test every year starting at age 36.  Flexible sigmoidoscopy or colonoscopy. You may have a sigmoidoscopy every 5 years or a colonoscopy every 10 years starting at age 36.  Prostate cancer screening. Recommendations will vary depending on your family history and other risks.  Hepatitis C blood test.  Hepatitis B blood test.  Sexually transmitted disease (STD) testing.  Diabetes screening. This is done by checking your blood sugar (glucose) after you have not eaten for a while (fasting). You may have this done every 1-3 years. Discuss your test results, treatment options, and if necessary, the need for more tests with your health care provider. Vaccines  Your health care provider may recommend certain vaccines, such as:  Influenza vaccine. This is recommended every year.  Tetanus, diphtheria, and acellular pertussis (Tdap, Td) vaccine. You may need a Td booster every 10 years.  Zoster vaccine. You may need this after age 67.  Pneumococcal 13-valent conjugate (PCV13) vaccine. You may need this if you have certain conditions and have not been vaccinated.  Pneumococcal polysaccharide (PPSV23) vaccine. You may need one or two doses if you smoke cigarettes or if  you have certain conditions. Talk to your health care provider about which screenings and vaccines you need and how often you need them. This information is not intended  to replace advice given to you by your health care provider. Make sure you discuss any questions you have with your health care provider. Document Released: 08/20/2015 Document Revised: 04/12/2016 Document Reviewed: 05/25/2015 Elsevier Interactive Patient Education  2017 [place] Prevention in the Home Falls can cause injuries. They can happen to people of all ages. There are many things you can do to make your home safe and to help prevent falls. What can I do on the outside of my home?  Regularly fix the edges of walkways and driveways and fix any cracks.  Remove anything that might make you trip as you walk through a door, such as a raised step or threshold.  Trim any bushes or trees on the path to your home.  Use bright outdoor lighting.  Clear any walking paths of anything that might make someone trip, such as rocks or tools.  Regularly check to see if handrails are loose or broken. Make sure that both sides of any steps have handrails.  Any raised decks and porches should have guardrails on the edges.  Have any leaves, snow, or ice cleared regularly.  Use sand or salt on walking paths during winter.  Clean up any spills in your garage right away. This includes oil or grease spills. What can I do in the bathroom?  Use night lights.  Install grab bars by the toilet and in the tub and shower. Do not use towel bars as grab bars.  Use non-skid mats or decals in the tub or shower.  If you need to sit down in the shower, use a plastic, non-slip stool.  Keep the floor dry. Clean up any water that spills on the floor as soon as it happens.  Remove soap buildup in the tub or shower regularly.  Attach bath mats securely with double-sided non-slip rug tape.  Do not have throw rugs and other things on the floor that can make you trip. What can I do in the bedroom?  Use night lights.  Make sure that you have a light by your bed that is easy to reach.  Do not use  any sheets or blankets that are too big for your bed. They should not hang down onto the floor.  Have a firm chair that has side arms. You can use this for support while you get dressed.  Do not have throw rugs and other things on the floor that can make you trip. What can I do in the kitchen?  Clean up any spills right away.  Avoid walking on wet floors.  Keep items that you use a lot in easy-to-reach places.  If you need to reach something above you, use a strong step stool that has a grab bar.  Keep electrical cords out of the way.  Do not use floor polish or wax that makes floors slippery. If you must use wax, use non-skid floor wax.  Do not have throw rugs and other things on the floor that can make you trip. What can I do with my stairs?  Do not leave any items on the stairs.  Make sure that there are handrails on both sides of the stairs and use them. Fix handrails that are broken or loose. Make sure that handrails are as long as the stairways.  Check any  carpeting to make sure that it is firmly attached to the stairs. Fix any carpet that is loose or worn.  Avoid having throw rugs at the top or bottom of the stairs. If you do have throw rugs, attach them to the floor with carpet tape.  Make sure that you have a light switch at the top of the stairs and the bottom of the stairs. If you do not have them, ask someone to add them for you. What else can I do to help prevent falls?  Wear shoes that:  Do not have high heels.  Have rubber bottoms.  Are comfortable and fit you well.  Are closed at the toe. Do not wear sandals.  If you use a stepladder:  Make sure that it is fully opened. Do not climb a closed stepladder.  Make sure that both sides of the stepladder are locked into place.  Ask someone to hold it for you, if possible.  Clearly mark and make sure that you can see:  Any grab bars or handrails.  First and last steps.  Where the edge of each step  is.  Use tools that help you move around (mobility aids) if they are needed. These include:  Canes.  Walkers.  Scooters.  Crutches.  Turn on the lights when you go into a dark area. Replace any light bulbs as soon as they burn out.  Set up your furniture so you have a clear path. Avoid moving your furniture around.  If any of your floors are uneven, fix them.  If there are any pets around you, be aware of where they are.  Review your medicines with your doctor. Some medicines can make you feel dizzy. This can increase your chance of falling. Ask your doctor what other things that you can do to help prevent falls. This information is not intended to replace advice given to you by your health care provider. Make sure you discuss any questions you have with your health care provider. Document Released: 05/20/2009 Document Revised: 12/30/2015 Document Reviewed: 08/28/2014 Elsevier Interactive Patient Education  2017 [employer].

## 2020-10-07 DIAGNOSIS — Z5181 Encounter for therapeutic drug level monitoring: Secondary | ICD-10-CM | POA: Diagnosis not present

## 2020-10-07 DIAGNOSIS — Z79899 Other long term (current) drug therapy: Secondary | ICD-10-CM | POA: Diagnosis not present

## 2020-10-07 DIAGNOSIS — Z5112 Encounter for antineoplastic immunotherapy: Secondary | ICD-10-CM | POA: Diagnosis not present

## 2020-10-07 DIAGNOSIS — Z5111 Encounter for antineoplastic chemotherapy: Secondary | ICD-10-CM | POA: Diagnosis not present

## 2020-10-07 DIAGNOSIS — C9000 Multiple myeloma not having achieved remission: Secondary | ICD-10-CM | POA: Diagnosis not present

## 2020-10-12 ENCOUNTER — Other Ambulatory Visit: Payer: Self-pay | Admitting: Family Medicine

## 2020-10-12 DIAGNOSIS — N138 Other obstructive and reflux uropathy: Secondary | ICD-10-CM

## 2020-10-12 DIAGNOSIS — N401 Enlarged prostate with lower urinary tract symptoms: Secondary | ICD-10-CM

## 2020-10-12 NOTE — Telephone Encounter (Signed)
  Notes to clinic: Patient had a medicare wellness on 09/28/2020 Protocol failed for encounter within last 12 months Review for refill    Requested Prescriptions  Pending Prescriptions Disp Refills   tamsulosin (FLOMAX) 0.4 MG CAPS capsule [Pharmacy Med Name: TAMSULOSIN HCL 0.4 MG CAPSULE] 30 capsule 0    Sig: TAKE 2 CAPSULES (0.8 MG TOTAL) BY MOUTH DAILY AFTER BREAKFAST.      Urology: Alpha-Adrenergic Blocker Failed - 10/12/2020 10:07 AM      Failed - Valid encounter within last 12 months    Recent Outpatient Visits           1 year ago Acute non-recurrent maxillary sinusitis   [place], Devonne Doughty, DO   2 years ago Acute rhinosinusitis   [facility] [place], Devonne Doughty, DO   2 years ago Pneumonia of right lower lobe due to infectious organism [facility])   [facility] Olin Hauser, DO   2 years ago Allergic sinusitis   [place], DO   2 years ago Acute right-sided low back pain with right-sided sciatica   [place], DO       Future Appointments             In 11 months [place] BP in normal range    BP Readings from Last 1 Encounters:  02/18/19 108/72

## 2020-10-14 DIAGNOSIS — Z79899 Other long term (current) drug therapy: Secondary | ICD-10-CM | POA: Diagnosis not present

## 2020-10-14 DIAGNOSIS — Z5111 Encounter for antineoplastic chemotherapy: Secondary | ICD-10-CM | POA: Diagnosis not present

## 2020-10-14 DIAGNOSIS — C9000 Multiple myeloma not having achieved remission: Secondary | ICD-10-CM | POA: Diagnosis not present

## 2020-10-21 DIAGNOSIS — Z5181 Encounter for therapeutic drug level monitoring: Secondary | ICD-10-CM | POA: Diagnosis not present

## 2020-10-21 DIAGNOSIS — Z5112 Encounter for antineoplastic immunotherapy: Secondary | ICD-10-CM | POA: Diagnosis not present

## 2020-10-21 DIAGNOSIS — Z5111 Encounter for antineoplastic chemotherapy: Secondary | ICD-10-CM | POA: Diagnosis not present

## 2020-10-21 DIAGNOSIS — Z79899 Other long term (current) drug therapy: Secondary | ICD-10-CM | POA: Diagnosis not present

## 2020-10-21 DIAGNOSIS — C9000 Multiple myeloma not having achieved remission: Secondary | ICD-10-CM | POA: Diagnosis not present

## 2020-10-28 ENCOUNTER — Encounter: Payer: Self-pay | Admitting: Family Medicine

## 2020-10-28 ENCOUNTER — Other Ambulatory Visit: Payer: Self-pay | Admitting: Family Medicine

## 2020-10-28 ENCOUNTER — Ambulatory Visit (INDEPENDENT_AMBULATORY_CARE_PROVIDER_SITE_OTHER): Payer: Medicare Other | Admitting: Family Medicine

## 2020-10-28 ENCOUNTER — Other Ambulatory Visit: Payer: Self-pay

## 2020-10-28 VITALS — BP 124/83 | HR 64 | Temp 98.0°F | Ht 72.0 in | Wt 294.0 lb

## 2020-10-28 DIAGNOSIS — C9000 Multiple myeloma not having achieved remission: Secondary | ICD-10-CM

## 2020-10-28 DIAGNOSIS — C9001 Multiple myeloma in remission: Secondary | ICD-10-CM | POA: Diagnosis not present

## 2020-10-28 DIAGNOSIS — N138 Other obstructive and reflux uropathy: Secondary | ICD-10-CM | POA: Diagnosis not present

## 2020-10-28 DIAGNOSIS — R7309 Other abnormal glucose: Secondary | ICD-10-CM

## 2020-10-28 DIAGNOSIS — E781 Pure hyperglyceridemia: Secondary | ICD-10-CM

## 2020-10-28 DIAGNOSIS — N401 Enlarged prostate with lower urinary tract symptoms: Secondary | ICD-10-CM | POA: Diagnosis not present

## 2020-10-28 MED ORDER — TAMSULOSIN HCL 0.4 MG PO CAPS: 0.8000 mg | ORAL_CAPSULE | Freq: Every day | ORAL | 3 refills | Status: AC

## 2020-10-28 NOTE — Progress Notes (Signed)
Subjective:    Patient ID: Kyle Parker, male    DOB: November 14, 1955, 65 y.o.   MRN: [id number]  Kyle Parker is a 65 y.o. male presenting on 10/28/2020 for Arm Pain (Mainly on the forearm, he feels its swollen. He hurt it last Saturday while using an electric drill./Pt would like some med refills on Tamsulosin.) and Urinary Frequency (Pt noticed it about a month ago)   HPI    BPH LUTS Chronic problem with LUTS urinary frequency Taking Tamsulosin 0.76m x2 for 0.861mdaily in AM with meal,overall does help. Still has some breakthrough frequency at times.  Multiple myleoma in remission Followed by [facility] On chemotherapy. No longer on maintenance med Revlimid He is taking Gabapentin 30021m times a day up to 5 pills per day On rare Oxycodone PRN, rarely use has some still but will need refill he will check with [facility] on this med  Elevated Triglycerides No recent lipid panel Due for labs  [facility] 09/28[facility]2020 09/16/2019 10/11/2018  Decreased Interest 0 0 0  Down, Depressed, Hopeless 0 0 0  PHQ - 2 Score 0 0 0    Social History   Tobacco Use  . Smoking status: Never Smoker  . Smokeless tobacco: Current User    Types: Chew  Vaping Use  . Vaping Use: Never used  Substance Use Topics  . Alcohol use: No    Alcohol/week: 0.0 standard drinks  . Drug use: No    Review of Systems Per HPI unless specifically indicated above     Objective:    BP 124/83 (BP Location: Left Arm, Patient Position: Sitting, Cuff Size: Normal)   Pulse 64   Temp 98 F (36.7 C) (Temporal)   Ht 6' (1.829 m)   Wt 294 lb (133.4 kg)   SpO2 97%   BMI 39.87 kg/m   Wt Readings from Last 3 Encounters:  10/28/20 294 lb (133.4 kg)  09/28/20 285 lb (129.3 kg)  09/16/19 288 lb (130.6 kg)    Physical Exam Vitals and nursing note reviewed.  Constitutional:      General: He is not in acute distress.    Appearance: He is well-developed. He is not diaphoretic.     Comments:  Well-appearing, comfortable, cooperative  HENT:     Head: Normocephalic and atraumatic.  Eyes:     General:        Right eye: No discharge.        Left eye: No discharge.     Conjunctiva/sclera: Conjunctivae normal.  Neck:     Thyroid: No thyromegaly.  Cardiovascular:     Rate and Rhythm: Normal rate and regular rhythm.     Heart sounds: Normal heart sounds. No murmur heard.   Pulmonary:     Effort: Pulmonary effort is normal. No respiratory distress.     Breath sounds: Normal breath sounds. No wheezing or rales.  Musculoskeletal:        General: Normal range of motion.     Cervical back: Normal range of motion and neck supple.  Lymphadenopathy:     Cervical: No cervical adenopathy.  Skin:    General: Skin is warm and dry.     Findings: No erythema or rash.  Neurological:     Mental Status: He is alert and oriented to person, place, and time.  Psychiatric:        Behavior: Behavior normal.     Comments: Well groomed, good eye contact, normal  speech and thoughts    Results for orders placed or performed during the hospital encounter of 02/17/19  Novel Coronavirus,NAA,(SEND-OUT TO REF LAB - TAT 24-48 hrs); Hosp Order   Specimen: Respiratory  Result Value Ref Range   SARS-CoV-2, NAA NOT DETECTED NOT DETECTED   Coronavirus Source NASOPHARYNGEAL   CBC with Differential  Result Value Ref Range   WBC 3.7 (L) 4.0 - 10.5 K/uL   RBC 3.60 (L) 4.22 - 5.81 MIL/uL   Hemoglobin 11.6 (L) 13.0 - 17.0 g/dL   HCT 34.1 (L) 39.0 - 52.0 %   MCV 94.7 80.0 - 100.0 fL   MCH 32.2 26.0 - 34.0 pg   MCHC 34.0 30.0 - 36.0 g/dL   RDW 14.3 11.5 - 15.5 %   Platelets 126 (L) 150 - 400 K/uL   nRBC 0.0 0.0 - 0.2 %   Neutrophils Relative % 58 %   Neutro Abs 2.1 1.7 - 7.7 K/uL   Lymphocytes Relative 22 %   Lymphs Abs 0.8 0.7 - 4.0 K/uL   Monocytes Relative 18 %   Monocytes Absolute 0.7 0.1 - 1.0 K/uL   Eosinophils Relative 1 %   Eosinophils Absolute 0.0 0.0 - 0.5 K/uL   Basophils Relative 1 %    Basophils Absolute 0.0 0.0 - 0.1 K/uL   Immature Granulocytes 0 %   Abs Immature Granulocytes 0.01 0.00 - 0.07 K/uL  Basic metabolic panel  Result Value Ref Range   Sodium 135 135 - 145 mmol/L   Potassium 3.6 3.5 - 5.1 mmol/L   Chloride 107 98 - 111 mmol/L   CO2 20 (L) 22 - 32 mmol/L   Glucose, Bld 112 (H) 70 - 99 mg/dL   BUN 17 8 - 23 mg/dL   Creatinine, Ser 1.25 (H) 0.61 - 1.24 mg/dL   Calcium 8.3 (L) 8.9 - 10.3 mg/dL   GFR calc non Af Amer >60 >60 mL/min   GFR calc Af Amer >60 >60 mL/min   Anion gap 8 5 - 15  Urinalysis, Complete w Microscopic  Result Value Ref Range   Color, Urine YELLOW (A) YELLOW   APPearance CLEAR (A) CLEAR   Specific Gravity, Urine 1.015 1.005 - 1.030   pH 6.0 5.0 - 8.0   Glucose, UA NEGATIVE NEGATIVE mg/dL   Hgb urine dipstick MODERATE (A) NEGATIVE   Bilirubin Urine NEGATIVE NEGATIVE   Ketones, ur NEGATIVE NEGATIVE mg/dL   Protein, ur NEGATIVE NEGATIVE mg/dL   Nitrite NEGATIVE NEGATIVE   Leukocytes,Ua NEGATIVE NEGATIVE   RBC / HPF 0-5 0 - 5 RBC/hpf   WBC, UA NONE SEEN 0 - 5 WBC/hpf   Bacteria, UA NONE SEEN NONE SEEN   Squamous Epithelial / LPF NONE SEEN 0 - 5      Assessment & Plan:   Problem List Items Addressed This Visit    Multiple myeloma without remission ([place])    Followed by [place] NP Continue on current management / surveillance      Relevant Medications   dexamethasone (DECADRON) 4 MG tablet   acyclovir (ZOVIRAX) 400 MG tablet   dexamethasone (DECADRON) 4 MG tablet   Multiple myeloma in remission (HCC) - Primary   Relevant Medications   dexamethasone (DECADRON) 4 MG tablet   acyclovir (ZOVIRAX) 400 MG tablet   dexamethasone (DECADRON) 4 MG tablet   Other Relevant Orders   PSA   BPH with obstruction/lower urinary tract symptoms    Stable BPH, on alpha blocker Due for PSA lab Last  DRE reported normal - No known personal/family history of prostate CA  Plan: 1. Continue Tamsulosin 0.92m x 2 = 0.837m daily 2. Follow-up in future, PSA etc      Relevant Medications   tamsulosin (FLOMAX) 0.4 MG CAPS capsule    Other Visit Diagnoses    Abnormal glucose       Relevant Orders   HgB A1c   Hypertriglyceridemia       Relevant Orders   Lipid panel    History of elevated TG Check lipids today  Prior elevated glucose Last a1c normal Will check screening A1c    Orders Placed This Encounter  Procedures  . HgB A1c  . Lipid panel  . PSA     Meds ordered this encounter  Medications  . tamsulosin (FLOMAX) 0.4 MG CAPS capsule    Sig: Take 2 capsules (0.8 mg total) by mouth daily after breakfast.    Dispense:  180 capsule    Refill:  3      Follow up plan: Return in about 1 year (around 10/28/2021) for 1 year for yearly check up / [facility] updates, refills Lab in AM.   AlNobie PutnamDO [place] 10/28/2020, 10:35 AM

## 2020-10-28 NOTE — Patient Instructions (Addendum)
Thank you for coming to the office today.  Refilled Tamsulosin. If not effective in future we can refer to Urology  Please follow up with [facility] Oncology provider for the refill request on Oxycodone  Right wrist looks slightly swollen with possible tendonitis flare up. Recommend wrist sleeve compression or brace and ice packs as needed for swelling,then if still sore or achy and stiff you can use moist heat to help it.  Last resort option can try OTC Voltaren (diclofenac) cream as needed for arthritis or inflammation pain   Please schedule a Follow-up Appointment to: Return in about 1 year (around 10/28/2021) for 1 year for yearly check up / [place] updates, refills Lab in AM.  If you have any other questions or concerns, please feel free to call the office or send a message through [place]. You may also schedule an earlier appointment if necessary.  Additionally, you may be receiving a survey about your experience at our office within a few days to 1 week by e-mail or mail. We value your feedback.  Nobie Putnam, DO [place]

## 2020-10-28 NOTE — Assessment & Plan Note (Signed)
Stable BPH, on alpha blocker Due for PSA lab Last DRE reported normal - No known personal/family history of prostate CA  Plan: 1. Continue Tamsulosin 0.4mg  x 2 = 0.8mg  daily 2. Follow-up in future, PSA etc

## 2020-10-28 NOTE — Assessment & Plan Note (Signed)
Followed by [place] NP Continue on current management / surveillance

## 2020-10-29 LAB — HEMOGLOBIN A1C
Hgb A1c MFr Bld: 5.3 % of total Hgb (ref <5.7)
Mean Plasma Glucose: 105 mg/dL
eAG (mmol/L): 5.8 mmol/L

## 2020-10-29 LAB — LIPID PANEL
Cholesterol: 183 mg/dL (ref <200)
HDL: 47 mg/dL (ref >=40)
LDL Cholesterol (Calc): 109 mg/dL (calc) — ABNORMAL HIGH
Non-HDL Cholesterol (Calc): 136 mg/dL (calc) — ABNORMAL HIGH (ref <130)
Total CHOL/HDL Ratio: 3.9 (calc) (ref <5.0)
Triglycerides: 153 mg/dL — ABNORMAL HIGH (ref <150)

## 2020-10-29 LAB — PSA: PSA: 0.14 ng/mL (ref <=4.0)

## 2020-11-04 DIAGNOSIS — R0609 Other forms of dyspnea: Secondary | ICD-10-CM | POA: Diagnosis not present

## 2020-11-04 DIAGNOSIS — Z85828 Personal history of other malignant neoplasm of skin: Secondary | ICD-10-CM | POA: Diagnosis not present

## 2020-11-04 DIAGNOSIS — Z79899 Other long term (current) drug therapy: Secondary | ICD-10-CM | POA: Diagnosis not present

## 2020-11-04 DIAGNOSIS — N179 Acute kidney failure, unspecified: Secondary | ICD-10-CM | POA: Diagnosis not present

## 2020-11-04 DIAGNOSIS — Z9484 Stem cells transplant status: Secondary | ICD-10-CM | POA: Diagnosis not present

## 2020-11-04 DIAGNOSIS — N281 Cyst of kidney, acquired: Secondary | ICD-10-CM | POA: Diagnosis not present

## 2020-11-04 DIAGNOSIS — G629 Polyneuropathy, unspecified: Secondary | ICD-10-CM | POA: Diagnosis not present

## 2020-11-04 DIAGNOSIS — Z7982 Long term (current) use of aspirin: Secondary | ICD-10-CM | POA: Diagnosis not present

## 2020-11-04 DIAGNOSIS — C9000 Multiple myeloma not having achieved remission: Secondary | ICD-10-CM | POA: Diagnosis not present

## 2020-11-04 DIAGNOSIS — Z5112 Encounter for antineoplastic immunotherapy: Secondary | ICD-10-CM | POA: Diagnosis not present

## 2020-11-04 DIAGNOSIS — R29898 Other symptoms and signs involving the musculoskeletal system: Secondary | ICD-10-CM | POA: Diagnosis not present

## 2020-11-04 DIAGNOSIS — R5383 Other fatigue: Secondary | ICD-10-CM | POA: Diagnosis not present

## 2020-11-04 DIAGNOSIS — Z8616 Personal history of COVID-19: Secondary | ICD-10-CM | POA: Diagnosis not present

## 2020-11-10 DIAGNOSIS — Z5181 Encounter for therapeutic drug level monitoring: Secondary | ICD-10-CM | POA: Diagnosis not present

## 2020-11-10 DIAGNOSIS — Z79899 Other long term (current) drug therapy: Secondary | ICD-10-CM | POA: Diagnosis not present

## 2020-11-10 DIAGNOSIS — R197 Diarrhea, unspecified: Secondary | ICD-10-CM | POA: Diagnosis not present

## 2020-11-10 DIAGNOSIS — C9001 Multiple myeloma in remission: Secondary | ICD-10-CM | POA: Diagnosis not present

## 2020-11-10 DIAGNOSIS — Z5111 Encounter for antineoplastic chemotherapy: Secondary | ICD-10-CM | POA: Diagnosis not present

## 2020-11-18 DIAGNOSIS — Z79899 Other long term (current) drug therapy: Secondary | ICD-10-CM | POA: Diagnosis not present

## 2020-11-18 DIAGNOSIS — Z5181 Encounter for therapeutic drug level monitoring: Secondary | ICD-10-CM | POA: Diagnosis not present

## 2020-11-18 DIAGNOSIS — C9000 Multiple myeloma not having achieved remission: Secondary | ICD-10-CM | POA: Diagnosis not present

## 2020-11-18 DIAGNOSIS — Z5112 Encounter for antineoplastic immunotherapy: Secondary | ICD-10-CM | POA: Diagnosis not present

## 2020-11-18 DIAGNOSIS — Z5111 Encounter for antineoplastic chemotherapy: Secondary | ICD-10-CM | POA: Diagnosis not present

## 2020-11-28 DIAGNOSIS — D849 Immunodeficiency, unspecified: Secondary | ICD-10-CM | POA: Diagnosis not present

## 2020-11-28 DIAGNOSIS — Z85828 Personal history of other malignant neoplasm of skin: Secondary | ICD-10-CM | POA: Diagnosis not present

## 2020-11-28 DIAGNOSIS — Z20822 Contact with and (suspected) exposure to covid-19: Secondary | ICD-10-CM | POA: Diagnosis not present

## 2020-11-28 DIAGNOSIS — R350 Frequency of micturition: Secondary | ICD-10-CM | POA: Diagnosis not present

## 2020-11-28 DIAGNOSIS — R000 Tachycardia, unspecified: Secondary | ICD-10-CM | POA: Diagnosis not present

## 2020-11-28 DIAGNOSIS — N390 Urinary tract infection, site not specified: Secondary | ICD-10-CM | POA: Diagnosis not present

## 2020-11-28 DIAGNOSIS — C9001 Multiple myeloma in remission: Secondary | ICD-10-CM | POA: Diagnosis not present

## 2020-11-28 DIAGNOSIS — F17220 Nicotine dependence, chewing tobacco, uncomplicated: Secondary | ICD-10-CM | POA: Diagnosis not present

## 2020-11-28 DIAGNOSIS — N12 Tubulo-interstitial nephritis, not specified as acute or chronic: Secondary | ICD-10-CM | POA: Diagnosis not present

## 2020-11-28 DIAGNOSIS — R300 Dysuria: Secondary | ICD-10-CM | POA: Diagnosis not present

## 2020-11-28 DIAGNOSIS — I5022 Chronic systolic (congestive) heart failure: Secondary | ICD-10-CM | POA: Diagnosis not present

## 2020-11-28 DIAGNOSIS — R509 Fever, unspecified: Secondary | ICD-10-CM | POA: Diagnosis not present

## 2020-11-29 DIAGNOSIS — R350 Frequency of micturition: Secondary | ICD-10-CM | POA: Diagnosis not present

## 2020-11-29 DIAGNOSIS — R509 Fever, unspecified: Secondary | ICD-10-CM | POA: Diagnosis not present

## 2020-11-29 DIAGNOSIS — R300 Dysuria: Secondary | ICD-10-CM | POA: Diagnosis not present

## 2020-12-09 DIAGNOSIS — R531 Weakness: Secondary | ICD-10-CM | POA: Diagnosis not present

## 2020-12-09 DIAGNOSIS — R5383 Other fatigue: Secondary | ICD-10-CM | POA: Diagnosis not present

## 2020-12-09 DIAGNOSIS — M25562 Pain in left knee: Secondary | ICD-10-CM | POA: Diagnosis not present

## 2020-12-09 DIAGNOSIS — Z5181 Encounter for therapeutic drug level monitoring: Secondary | ICD-10-CM | POA: Diagnosis not present

## 2020-12-09 DIAGNOSIS — Z79899 Other long term (current) drug therapy: Secondary | ICD-10-CM | POA: Diagnosis not present

## 2020-12-09 DIAGNOSIS — N281 Cyst of kidney, acquired: Secondary | ICD-10-CM | POA: Diagnosis not present

## 2020-12-09 DIAGNOSIS — Z7982 Long term (current) use of aspirin: Secondary | ICD-10-CM | POA: Diagnosis not present

## 2020-12-09 DIAGNOSIS — C9000 Multiple myeloma not having achieved remission: Secondary | ICD-10-CM | POA: Diagnosis not present

## 2020-12-09 DIAGNOSIS — Z5111 Encounter for antineoplastic chemotherapy: Secondary | ICD-10-CM | POA: Diagnosis not present

## 2020-12-09 DIAGNOSIS — R197 Diarrhea, unspecified: Secondary | ICD-10-CM | POA: Diagnosis not present

## 2020-12-09 DIAGNOSIS — Z85828 Personal history of other malignant neoplasm of skin: Secondary | ICD-10-CM | POA: Diagnosis not present

## 2020-12-09 DIAGNOSIS — Z5112 Encounter for antineoplastic immunotherapy: Secondary | ICD-10-CM | POA: Diagnosis not present

## 2020-12-09 DIAGNOSIS — Z981 Arthrodesis status: Secondary | ICD-10-CM | POA: Diagnosis not present

## 2020-12-09 DIAGNOSIS — N179 Acute kidney failure, unspecified: Secondary | ICD-10-CM | POA: Diagnosis not present

## 2020-12-09 DIAGNOSIS — M25561 Pain in right knee: Secondary | ICD-10-CM | POA: Diagnosis not present

## 2020-12-09 DIAGNOSIS — G629 Polyneuropathy, unspecified: Secondary | ICD-10-CM | POA: Diagnosis not present

## 2020-12-16 DIAGNOSIS — C9001 Multiple myeloma in remission: Secondary | ICD-10-CM | POA: Diagnosis not present

## 2020-12-16 DIAGNOSIS — Z79899 Other long term (current) drug therapy: Secondary | ICD-10-CM | POA: Diagnosis not present

## 2020-12-16 DIAGNOSIS — U071 COVID-19: Secondary | ICD-10-CM | POA: Diagnosis not present

## 2020-12-16 DIAGNOSIS — Z5181 Encounter for therapeutic drug level monitoring: Secondary | ICD-10-CM | POA: Diagnosis not present

## 2020-12-20 DIAGNOSIS — U071 COVID-19: Secondary | ICD-10-CM | POA: Diagnosis not present

## 2021-01-06 DIAGNOSIS — Z5112 Encounter for antineoplastic immunotherapy: Secondary | ICD-10-CM | POA: Diagnosis not present

## 2021-01-06 DIAGNOSIS — R197 Diarrhea, unspecified: Secondary | ICD-10-CM | POA: Diagnosis not present

## 2021-01-06 DIAGNOSIS — N179 Acute kidney failure, unspecified: Secondary | ICD-10-CM | POA: Diagnosis not present

## 2021-01-06 DIAGNOSIS — G629 Polyneuropathy, unspecified: Secondary | ICD-10-CM | POA: Diagnosis not present

## 2021-01-06 DIAGNOSIS — Z79899 Other long term (current) drug therapy: Secondary | ICD-10-CM | POA: Diagnosis not present

## 2021-01-06 DIAGNOSIS — C9000 Multiple myeloma not having achieved remission: Secondary | ICD-10-CM | POA: Diagnosis not present

## 2021-01-06 DIAGNOSIS — Z8616 Personal history of COVID-19: Secondary | ICD-10-CM | POA: Diagnosis not present

## 2021-01-06 DIAGNOSIS — Z7982 Long term (current) use of aspirin: Secondary | ICD-10-CM | POA: Diagnosis not present

## 2021-01-06 DIAGNOSIS — R5383 Other fatigue: Secondary | ICD-10-CM | POA: Diagnosis not present

## 2021-01-06 DIAGNOSIS — N281 Cyst of kidney, acquired: Secondary | ICD-10-CM | POA: Diagnosis not present

## 2021-01-06 DIAGNOSIS — Z85828 Personal history of other malignant neoplasm of skin: Secondary | ICD-10-CM | POA: Diagnosis not present

## 2021-01-06 DIAGNOSIS — Z5111 Encounter for antineoplastic chemotherapy: Secondary | ICD-10-CM | POA: Diagnosis not present

## 2021-01-06 DIAGNOSIS — R531 Weakness: Secondary | ICD-10-CM | POA: Diagnosis not present

## 2021-01-11 DIAGNOSIS — M25512 Pain in left shoulder: Secondary | ICD-10-CM | POA: Diagnosis not present

## 2021-01-11 DIAGNOSIS — M542 Cervicalgia: Secondary | ICD-10-CM | POA: Diagnosis not present

## 2021-01-11 DIAGNOSIS — C9000 Multiple myeloma not having achieved remission: Secondary | ICD-10-CM | POA: Diagnosis not present

## 2021-01-13 DIAGNOSIS — Z5181 Encounter for therapeutic drug level monitoring: Secondary | ICD-10-CM | POA: Diagnosis not present

## 2021-01-13 DIAGNOSIS — C9000 Multiple myeloma not having achieved remission: Secondary | ICD-10-CM | POA: Diagnosis not present

## 2021-01-13 DIAGNOSIS — Z5111 Encounter for antineoplastic chemotherapy: Secondary | ICD-10-CM | POA: Diagnosis not present

## 2021-01-13 DIAGNOSIS — Z79899 Other long term (current) drug therapy: Secondary | ICD-10-CM | POA: Diagnosis not present

## 2021-01-20 DIAGNOSIS — Z5181 Encounter for therapeutic drug level monitoring: Secondary | ICD-10-CM | POA: Diagnosis not present

## 2021-01-20 DIAGNOSIS — C9000 Multiple myeloma not having achieved remission: Secondary | ICD-10-CM | POA: Diagnosis not present

## 2021-01-20 DIAGNOSIS — Z5112 Encounter for antineoplastic immunotherapy: Secondary | ICD-10-CM | POA: Diagnosis not present

## 2021-01-20 DIAGNOSIS — Z5111 Encounter for antineoplastic chemotherapy: Secondary | ICD-10-CM | POA: Diagnosis not present

## 2021-01-20 DIAGNOSIS — Z79899 Other long term (current) drug therapy: Secondary | ICD-10-CM | POA: Diagnosis not present

## 2021-02-03 DIAGNOSIS — Z5112 Encounter for antineoplastic immunotherapy: Secondary | ICD-10-CM | POA: Diagnosis not present

## 2021-02-03 DIAGNOSIS — M542 Cervicalgia: Secondary | ICD-10-CM | POA: Diagnosis not present

## 2021-02-03 DIAGNOSIS — Z5111 Encounter for antineoplastic chemotherapy: Secondary | ICD-10-CM | POA: Diagnosis not present

## 2021-02-03 DIAGNOSIS — G620 Drug-induced polyneuropathy: Secondary | ICD-10-CM | POA: Diagnosis not present

## 2021-02-03 DIAGNOSIS — M25512 Pain in left shoulder: Secondary | ICD-10-CM | POA: Diagnosis not present

## 2021-02-03 DIAGNOSIS — R531 Weakness: Secondary | ICD-10-CM | POA: Diagnosis not present

## 2021-02-03 DIAGNOSIS — Z7982 Long term (current) use of aspirin: Secondary | ICD-10-CM | POA: Diagnosis not present

## 2021-02-03 DIAGNOSIS — R5383 Other fatigue: Secondary | ICD-10-CM | POA: Diagnosis not present

## 2021-02-03 DIAGNOSIS — L539 Erythematous condition, unspecified: Secondary | ICD-10-CM | POA: Diagnosis not present

## 2021-02-03 DIAGNOSIS — N179 Acute kidney failure, unspecified: Secondary | ICD-10-CM | POA: Diagnosis not present

## 2021-02-03 DIAGNOSIS — Z79899 Other long term (current) drug therapy: Secondary | ICD-10-CM | POA: Diagnosis not present

## 2021-02-03 DIAGNOSIS — Z8616 Personal history of COVID-19: Secondary | ICD-10-CM | POA: Diagnosis not present

## 2021-02-03 DIAGNOSIS — R197 Diarrhea, unspecified: Secondary | ICD-10-CM | POA: Diagnosis not present

## 2021-02-03 DIAGNOSIS — C9000 Multiple myeloma not having achieved remission: Secondary | ICD-10-CM | POA: Diagnosis not present

## 2021-02-03 DIAGNOSIS — Z85828 Personal history of other malignant neoplasm of skin: Secondary | ICD-10-CM | POA: Diagnosis not present

## 2021-02-03 DIAGNOSIS — G629 Polyneuropathy, unspecified: Secondary | ICD-10-CM | POA: Diagnosis not present

## 2021-02-03 DIAGNOSIS — N281 Cyst of kidney, acquired: Secondary | ICD-10-CM | POA: Diagnosis not present

## 2021-02-10 DIAGNOSIS — Z79899 Other long term (current) drug therapy: Secondary | ICD-10-CM | POA: Diagnosis not present

## 2021-02-10 DIAGNOSIS — Z5181 Encounter for therapeutic drug level monitoring: Secondary | ICD-10-CM | POA: Diagnosis not present

## 2021-02-10 DIAGNOSIS — Z5111 Encounter for antineoplastic chemotherapy: Secondary | ICD-10-CM | POA: Diagnosis not present

## 2021-02-10 DIAGNOSIS — C9000 Multiple myeloma not having achieved remission: Secondary | ICD-10-CM | POA: Diagnosis not present

## 2021-02-17 DIAGNOSIS — Z79899 Other long term (current) drug therapy: Secondary | ICD-10-CM | POA: Diagnosis not present

## 2021-02-17 DIAGNOSIS — Z5112 Encounter for antineoplastic immunotherapy: Secondary | ICD-10-CM | POA: Diagnosis not present

## 2021-02-17 DIAGNOSIS — C9000 Multiple myeloma not having achieved remission: Secondary | ICD-10-CM | POA: Diagnosis not present

## 2021-02-17 DIAGNOSIS — Z5111 Encounter for antineoplastic chemotherapy: Secondary | ICD-10-CM | POA: Diagnosis not present

## 2021-02-17 DIAGNOSIS — Z5181 Encounter for therapeutic drug level monitoring: Secondary | ICD-10-CM | POA: Diagnosis not present

## 2021-02-18 DIAGNOSIS — C9002 Multiple myeloma in relapse: Secondary | ICD-10-CM | POA: Diagnosis not present

## 2021-02-18 DIAGNOSIS — M542 Cervicalgia: Secondary | ICD-10-CM | POA: Diagnosis not present

## 2021-02-18 DIAGNOSIS — Z981 Arthrodesis status: Secondary | ICD-10-CM | POA: Diagnosis not present

## 2021-02-18 DIAGNOSIS — M25512 Pain in left shoulder: Secondary | ICD-10-CM | POA: Diagnosis not present

## 2021-02-18 DIAGNOSIS — M47812 Spondylosis without myelopathy or radiculopathy, cervical region: Secondary | ICD-10-CM | POA: Diagnosis not present

## 2021-02-18 DIAGNOSIS — M19012 Primary osteoarthritis, left shoulder: Secondary | ICD-10-CM | POA: Diagnosis not present

## 2021-02-20 DIAGNOSIS — R0602 Shortness of breath: Secondary | ICD-10-CM | POA: Diagnosis not present

## 2021-02-22 DIAGNOSIS — M25512 Pain in left shoulder: Secondary | ICD-10-CM | POA: Diagnosis not present

## 2021-02-22 DIAGNOSIS — M19012 Primary osteoarthritis, left shoulder: Secondary | ICD-10-CM | POA: Diagnosis not present

## 2021-02-22 DIAGNOSIS — M9951 Intervertebral disc stenosis of neural canal of cervical region: Secondary | ICD-10-CM | POA: Diagnosis not present

## 2021-02-22 DIAGNOSIS — M67912 Unspecified disorder of synovium and tendon, left shoulder: Secondary | ICD-10-CM | POA: Diagnosis not present

## 2021-03-03 DIAGNOSIS — G620 Drug-induced polyneuropathy: Secondary | ICD-10-CM | POA: Diagnosis not present

## 2021-03-03 DIAGNOSIS — Z79899 Other long term (current) drug therapy: Secondary | ICD-10-CM | POA: Diagnosis not present

## 2021-03-03 DIAGNOSIS — N281 Cyst of kidney, acquired: Secondary | ICD-10-CM | POA: Diagnosis not present

## 2021-03-03 DIAGNOSIS — Z85828 Personal history of other malignant neoplasm of skin: Secondary | ICD-10-CM | POA: Diagnosis not present

## 2021-03-03 DIAGNOSIS — R197 Diarrhea, unspecified: Secondary | ICD-10-CM | POA: Diagnosis not present

## 2021-03-03 DIAGNOSIS — M25512 Pain in left shoulder: Secondary | ICD-10-CM | POA: Diagnosis not present

## 2021-03-03 DIAGNOSIS — Z8616 Personal history of COVID-19: Secondary | ICD-10-CM | POA: Diagnosis not present

## 2021-03-03 DIAGNOSIS — M542 Cervicalgia: Secondary | ICD-10-CM | POA: Diagnosis not present

## 2021-03-03 DIAGNOSIS — R531 Weakness: Secondary | ICD-10-CM | POA: Diagnosis not present

## 2021-03-03 DIAGNOSIS — Z5111 Encounter for antineoplastic chemotherapy: Secondary | ICD-10-CM | POA: Diagnosis not present

## 2021-03-03 DIAGNOSIS — Z7982 Long term (current) use of aspirin: Secondary | ICD-10-CM | POA: Diagnosis not present

## 2021-03-03 DIAGNOSIS — Z5181 Encounter for therapeutic drug level monitoring: Secondary | ICD-10-CM | POA: Diagnosis not present

## 2021-03-03 DIAGNOSIS — N179 Acute kidney failure, unspecified: Secondary | ICD-10-CM | POA: Diagnosis not present

## 2021-03-03 DIAGNOSIS — C9000 Multiple myeloma not having achieved remission: Secondary | ICD-10-CM | POA: Diagnosis not present

## 2021-03-03 DIAGNOSIS — R5383 Other fatigue: Secondary | ICD-10-CM | POA: Diagnosis not present

## 2021-03-03 DIAGNOSIS — G629 Polyneuropathy, unspecified: Secondary | ICD-10-CM | POA: Diagnosis not present

## 2021-03-03 DIAGNOSIS — Z5112 Encounter for antineoplastic immunotherapy: Secondary | ICD-10-CM | POA: Diagnosis not present

## 2021-03-03 DIAGNOSIS — C9002 Multiple myeloma in relapse: Secondary | ICD-10-CM | POA: Diagnosis not present

## 2021-03-08 DIAGNOSIS — Z5111 Encounter for antineoplastic chemotherapy: Secondary | ICD-10-CM | POA: Diagnosis not present

## 2021-03-08 DIAGNOSIS — Z9481 Bone marrow transplant status: Secondary | ICD-10-CM | POA: Diagnosis not present

## 2021-03-08 DIAGNOSIS — C9002 Multiple myeloma in relapse: Secondary | ICD-10-CM | POA: Diagnosis not present

## 2021-03-11 DIAGNOSIS — C9002 Multiple myeloma in relapse: Secondary | ICD-10-CM | POA: Diagnosis not present

## 2021-03-11 DIAGNOSIS — C9000 Multiple myeloma not having achieved remission: Secondary | ICD-10-CM | POA: Diagnosis not present

## 2021-03-11 DIAGNOSIS — Z9481 Bone marrow transplant status: Secondary | ICD-10-CM | POA: Diagnosis not present

## 2021-03-11 DIAGNOSIS — F17220 Nicotine dependence, chewing tobacco, uncomplicated: Secondary | ICD-10-CM | POA: Diagnosis not present

## 2021-03-11 DIAGNOSIS — Z48290 Encounter for aftercare following bone marrow transplant: Secondary | ICD-10-CM | POA: Diagnosis not present

## 2021-03-14 DIAGNOSIS — M542 Cervicalgia: Secondary | ICD-10-CM | POA: Diagnosis not present

## 2021-03-16 DIAGNOSIS — C9002 Multiple myeloma in relapse: Secondary | ICD-10-CM | POA: Diagnosis not present

## 2021-03-16 DIAGNOSIS — Z5112 Encounter for antineoplastic immunotherapy: Secondary | ICD-10-CM | POA: Diagnosis not present

## 2021-03-16 DIAGNOSIS — D696 Thrombocytopenia, unspecified: Secondary | ICD-10-CM | POA: Diagnosis not present

## 2021-03-18 DIAGNOSIS — N179 Acute kidney failure, unspecified: Secondary | ICD-10-CM | POA: Diagnosis not present

## 2021-03-18 DIAGNOSIS — C9002 Multiple myeloma in relapse: Secondary | ICD-10-CM | POA: Diagnosis not present

## 2021-03-18 DIAGNOSIS — R7989 Other specified abnormal findings of blood chemistry: Secondary | ICD-10-CM | POA: Diagnosis not present

## 2021-03-21 DIAGNOSIS — D61810 Antineoplastic chemotherapy induced pancytopenia: Secondary | ICD-10-CM | POA: Diagnosis not present

## 2021-03-21 DIAGNOSIS — D849 Immunodeficiency, unspecified: Secondary | ICD-10-CM | POA: Diagnosis not present

## 2021-03-21 DIAGNOSIS — Z79899 Other long term (current) drug therapy: Secondary | ICD-10-CM | POA: Diagnosis not present

## 2021-03-21 DIAGNOSIS — A419 Sepsis, unspecified organism: Secondary | ICD-10-CM | POA: Diagnosis not present

## 2021-03-21 DIAGNOSIS — T451X5A Adverse effect of antineoplastic and immunosuppressive drugs, initial encounter: Secondary | ICD-10-CM | POA: Diagnosis not present

## 2021-03-21 DIAGNOSIS — G629 Polyneuropathy, unspecified: Secondary | ICD-10-CM | POA: Diagnosis not present

## 2021-03-21 DIAGNOSIS — R0602 Shortness of breath: Secondary | ICD-10-CM | POA: Diagnosis not present

## 2021-03-21 DIAGNOSIS — Z8616 Personal history of COVID-19: Secondary | ICD-10-CM | POA: Diagnosis not present

## 2021-03-21 DIAGNOSIS — D649 Anemia, unspecified: Secondary | ICD-10-CM | POA: Diagnosis not present

## 2021-03-21 DIAGNOSIS — D72820 Lymphocytosis (symptomatic): Secondary | ICD-10-CM | POA: Diagnosis not present

## 2021-03-21 DIAGNOSIS — B882 Other arthropod infestations: Secondary | ICD-10-CM | POA: Diagnosis not present

## 2021-03-21 DIAGNOSIS — D61818 Other pancytopenia: Secondary | ICD-10-CM | POA: Diagnosis not present

## 2021-03-21 DIAGNOSIS — N179 Acute kidney failure, unspecified: Secondary | ICD-10-CM | POA: Diagnosis not present

## 2021-03-21 DIAGNOSIS — I2699 Other pulmonary embolism without acute cor pulmonale: Secondary | ICD-10-CM | POA: Diagnosis not present

## 2021-03-21 DIAGNOSIS — E538 Deficiency of other specified B group vitamins: Secondary | ICD-10-CM | POA: Diagnosis not present

## 2021-03-21 DIAGNOSIS — R110 Nausea: Secondary | ICD-10-CM | POA: Diagnosis not present

## 2021-03-21 DIAGNOSIS — C9000 Multiple myeloma not having achieved remission: Secondary | ICD-10-CM | POA: Diagnosis not present

## 2021-03-21 DIAGNOSIS — D72819 Decreased white blood cell count, unspecified: Secondary | ICD-10-CM | POA: Diagnosis not present

## 2021-03-21 DIAGNOSIS — R1909 Other intra-abdominal and pelvic swelling, mass and lump: Secondary | ICD-10-CM | POA: Diagnosis not present

## 2021-03-21 DIAGNOSIS — J9811 Atelectasis: Secondary | ICD-10-CM | POA: Diagnosis not present

## 2021-03-21 DIAGNOSIS — D709 Neutropenia, unspecified: Secondary | ICD-10-CM | POA: Diagnosis not present

## 2021-03-21 DIAGNOSIS — Z20822 Contact with and (suspected) exposure to covid-19: Secondary | ICD-10-CM | POA: Diagnosis not present

## 2021-03-21 DIAGNOSIS — C9002 Multiple myeloma in relapse: Secondary | ICD-10-CM | POA: Diagnosis not present

## 2021-03-21 DIAGNOSIS — R5081 Fever presenting with conditions classified elsewhere: Secondary | ICD-10-CM | POA: Diagnosis not present

## 2021-03-21 DIAGNOSIS — D696 Thrombocytopenia, unspecified: Secondary | ICD-10-CM | POA: Diagnosis not present

## 2021-03-21 DIAGNOSIS — N2889 Other specified disorders of kidney and ureter: Secondary | ICD-10-CM | POA: Diagnosis not present

## 2021-03-21 DIAGNOSIS — Z9481 Bone marrow transplant status: Secondary | ICD-10-CM | POA: Diagnosis not present

## 2021-03-23 DIAGNOSIS — I2699 Other pulmonary embolism without acute cor pulmonale: Secondary | ICD-10-CM | POA: Diagnosis not present

## 2021-03-23 DIAGNOSIS — J9811 Atelectasis: Secondary | ICD-10-CM | POA: Diagnosis not present

## 2021-03-24 DIAGNOSIS — A419 Sepsis, unspecified organism: Secondary | ICD-10-CM | POA: Diagnosis not present

## 2021-03-24 DIAGNOSIS — J9811 Atelectasis: Secondary | ICD-10-CM | POA: Diagnosis not present

## 2021-03-25 DIAGNOSIS — D649 Anemia, unspecified: Secondary | ICD-10-CM | POA: Diagnosis not present

## 2021-03-25 DIAGNOSIS — D696 Thrombocytopenia, unspecified: Secondary | ICD-10-CM | POA: Diagnosis not present

## 2021-03-25 DIAGNOSIS — D72819 Decreased white blood cell count, unspecified: Secondary | ICD-10-CM | POA: Diagnosis not present

## 2021-03-26 DIAGNOSIS — D61818 Other pancytopenia: Secondary | ICD-10-CM | POA: Diagnosis not present

## 2021-03-26 DIAGNOSIS — D709 Neutropenia, unspecified: Secondary | ICD-10-CM | POA: Diagnosis not present

## 2021-03-26 DIAGNOSIS — D696 Thrombocytopenia, unspecified: Secondary | ICD-10-CM | POA: Diagnosis not present

## 2021-03-26 DIAGNOSIS — N2889 Other specified disorders of kidney and ureter: Secondary | ICD-10-CM | POA: Diagnosis not present

## 2021-03-26 DIAGNOSIS — N179 Acute kidney failure, unspecified: Secondary | ICD-10-CM | POA: Diagnosis not present

## 2021-03-26 DIAGNOSIS — A419 Sepsis, unspecified organism: Secondary | ICD-10-CM | POA: Diagnosis not present

## 2021-03-26 DIAGNOSIS — E538 Deficiency of other specified B group vitamins: Secondary | ICD-10-CM | POA: Diagnosis not present

## 2021-03-26 DIAGNOSIS — R0602 Shortness of breath: Secondary | ICD-10-CM | POA: Diagnosis not present

## 2021-03-26 DIAGNOSIS — R5081 Fever presenting with conditions classified elsewhere: Secondary | ICD-10-CM | POA: Diagnosis not present

## 2021-03-26 DIAGNOSIS — Z79899 Other long term (current) drug therapy: Secondary | ICD-10-CM | POA: Diagnosis not present

## 2021-03-27 DIAGNOSIS — D709 Neutropenia, unspecified: Secondary | ICD-10-CM | POA: Diagnosis not present

## 2021-03-27 DIAGNOSIS — R0602 Shortness of breath: Secondary | ICD-10-CM | POA: Diagnosis not present

## 2021-03-27 DIAGNOSIS — Z79899 Other long term (current) drug therapy: Secondary | ICD-10-CM | POA: Diagnosis not present

## 2021-03-27 DIAGNOSIS — D61818 Other pancytopenia: Secondary | ICD-10-CM | POA: Diagnosis not present

## 2021-03-27 DIAGNOSIS — A419 Sepsis, unspecified organism: Secondary | ICD-10-CM | POA: Diagnosis not present

## 2021-03-28 DIAGNOSIS — D709 Neutropenia, unspecified: Secondary | ICD-10-CM | POA: Diagnosis not present

## 2021-03-28 DIAGNOSIS — R0602 Shortness of breath: Secondary | ICD-10-CM | POA: Diagnosis not present

## 2021-03-28 DIAGNOSIS — A419 Sepsis, unspecified organism: Secondary | ICD-10-CM | POA: Diagnosis not present

## 2021-03-28 DIAGNOSIS — D61818 Other pancytopenia: Secondary | ICD-10-CM | POA: Diagnosis not present

## 2021-03-28 DIAGNOSIS — Z79899 Other long term (current) drug therapy: Secondary | ICD-10-CM | POA: Diagnosis not present

## 2021-03-29 DIAGNOSIS — Z79899 Other long term (current) drug therapy: Secondary | ICD-10-CM | POA: Diagnosis not present

## 2021-03-29 DIAGNOSIS — R0602 Shortness of breath: Secondary | ICD-10-CM | POA: Diagnosis not present

## 2021-03-29 DIAGNOSIS — C9002 Multiple myeloma in relapse: Secondary | ICD-10-CM | POA: Diagnosis not present

## 2021-03-29 DIAGNOSIS — D61818 Other pancytopenia: Secondary | ICD-10-CM | POA: Diagnosis not present

## 2021-03-29 DIAGNOSIS — R1909 Other intra-abdominal and pelvic swelling, mass and lump: Secondary | ICD-10-CM | POA: Diagnosis not present

## 2021-03-29 DIAGNOSIS — D709 Neutropenia, unspecified: Secondary | ICD-10-CM | POA: Diagnosis not present

## 2021-03-29 DIAGNOSIS — A419 Sepsis, unspecified organism: Secondary | ICD-10-CM | POA: Diagnosis not present

## 2021-03-30 DIAGNOSIS — B882 Other arthropod infestations: Secondary | ICD-10-CM | POA: Diagnosis not present

## 2021-03-30 DIAGNOSIS — D61818 Other pancytopenia: Secondary | ICD-10-CM | POA: Diagnosis not present

## 2021-03-30 DIAGNOSIS — R5081 Fever presenting with conditions classified elsewhere: Secondary | ICD-10-CM | POA: Diagnosis not present

## 2021-03-30 DIAGNOSIS — N2889 Other specified disorders of kidney and ureter: Secondary | ICD-10-CM | POA: Diagnosis not present

## 2021-03-30 DIAGNOSIS — C9002 Multiple myeloma in relapse: Secondary | ICD-10-CM | POA: Diagnosis not present

## 2021-03-30 DIAGNOSIS — D709 Neutropenia, unspecified: Secondary | ICD-10-CM | POA: Diagnosis not present

## 2021-03-30 DIAGNOSIS — R0602 Shortness of breath: Secondary | ICD-10-CM | POA: Diagnosis not present

## 2021-03-30 DIAGNOSIS — Z79899 Other long term (current) drug therapy: Secondary | ICD-10-CM | POA: Diagnosis not present

## 2021-03-30 DIAGNOSIS — N179 Acute kidney failure, unspecified: Secondary | ICD-10-CM | POA: Diagnosis not present

## 2021-03-30 DIAGNOSIS — E538 Deficiency of other specified B group vitamins: Secondary | ICD-10-CM | POA: Diagnosis not present

## 2021-03-31 DIAGNOSIS — D72819 Decreased white blood cell count, unspecified: Secondary | ICD-10-CM | POA: Diagnosis not present

## 2021-03-31 DIAGNOSIS — R0602 Shortness of breath: Secondary | ICD-10-CM | POA: Diagnosis not present

## 2021-03-31 DIAGNOSIS — C9002 Multiple myeloma in relapse: Secondary | ICD-10-CM | POA: Diagnosis not present

## 2021-03-31 DIAGNOSIS — D72820 Lymphocytosis (symptomatic): Secondary | ICD-10-CM | POA: Diagnosis not present

## 2021-03-31 DIAGNOSIS — D709 Neutropenia, unspecified: Secondary | ICD-10-CM | POA: Diagnosis not present

## 2021-03-31 DIAGNOSIS — N179 Acute kidney failure, unspecified: Secondary | ICD-10-CM | POA: Diagnosis not present

## 2021-03-31 DIAGNOSIS — D696 Thrombocytopenia, unspecified: Secondary | ICD-10-CM | POA: Diagnosis not present

## 2021-03-31 DIAGNOSIS — D649 Anemia, unspecified: Secondary | ICD-10-CM | POA: Diagnosis not present

## 2021-03-31 DIAGNOSIS — D61818 Other pancytopenia: Secondary | ICD-10-CM | POA: Diagnosis not present

## 2021-03-31 DIAGNOSIS — Z79899 Other long term (current) drug therapy: Secondary | ICD-10-CM | POA: Diagnosis not present

## 2021-03-31 DIAGNOSIS — C9000 Multiple myeloma not having achieved remission: Secondary | ICD-10-CM | POA: Diagnosis not present

## 2021-04-01 DIAGNOSIS — Z9481 Bone marrow transplant status: Secondary | ICD-10-CM | POA: Diagnosis not present

## 2021-04-01 DIAGNOSIS — R0602 Shortness of breath: Secondary | ICD-10-CM | POA: Diagnosis not present

## 2021-04-01 DIAGNOSIS — N179 Acute kidney failure, unspecified: Secondary | ICD-10-CM | POA: Diagnosis not present

## 2021-04-01 DIAGNOSIS — N2889 Other specified disorders of kidney and ureter: Secondary | ICD-10-CM | POA: Diagnosis not present

## 2021-04-01 DIAGNOSIS — R5081 Fever presenting with conditions classified elsewhere: Secondary | ICD-10-CM | POA: Diagnosis not present

## 2021-04-01 DIAGNOSIS — E538 Deficiency of other specified B group vitamins: Secondary | ICD-10-CM | POA: Diagnosis not present

## 2021-04-01 DIAGNOSIS — B882 Other arthropod infestations: Secondary | ICD-10-CM | POA: Diagnosis not present

## 2021-04-01 DIAGNOSIS — D61818 Other pancytopenia: Secondary | ICD-10-CM | POA: Diagnosis not present

## 2021-04-01 DIAGNOSIS — C9002 Multiple myeloma in relapse: Secondary | ICD-10-CM | POA: Diagnosis not present

## 2021-04-01 DIAGNOSIS — D709 Neutropenia, unspecified: Secondary | ICD-10-CM | POA: Diagnosis not present

## 2021-04-01 DIAGNOSIS — Z79899 Other long term (current) drug therapy: Secondary | ICD-10-CM | POA: Diagnosis not present

## 2021-04-04 DIAGNOSIS — R5081 Fever presenting with conditions classified elsewhere: Secondary | ICD-10-CM | POA: Diagnosis not present

## 2021-04-04 DIAGNOSIS — E875 Hyperkalemia: Secondary | ICD-10-CM | POA: Diagnosis not present

## 2021-04-04 DIAGNOSIS — R059 Cough, unspecified: Secondary | ICD-10-CM | POA: Diagnosis not present

## 2021-04-04 DIAGNOSIS — Z9484 Stem cells transplant status: Secondary | ICD-10-CM | POA: Diagnosis not present

## 2021-04-04 DIAGNOSIS — C9001 Multiple myeloma in remission: Secondary | ICD-10-CM | POA: Diagnosis not present

## 2021-04-04 DIAGNOSIS — D61818 Other pancytopenia: Secondary | ICD-10-CM | POA: Diagnosis not present

## 2021-04-04 DIAGNOSIS — K5900 Constipation, unspecified: Secondary | ICD-10-CM | POA: Diagnosis not present

## 2021-04-04 DIAGNOSIS — N183 Chronic kidney disease, stage 3 unspecified: Secondary | ICD-10-CM | POA: Diagnosis not present

## 2021-04-04 DIAGNOSIS — Z8579 Personal history of other malignant neoplasms of lymphoid, hematopoietic and related tissues: Secondary | ICD-10-CM | POA: Diagnosis not present

## 2021-04-04 DIAGNOSIS — R600 Localized edema: Secondary | ICD-10-CM | POA: Diagnosis not present

## 2021-04-04 DIAGNOSIS — Z8616 Personal history of COVID-19: Secondary | ICD-10-CM | POA: Diagnosis not present

## 2021-04-04 DIAGNOSIS — B348 Other viral infections of unspecified site: Secondary | ICD-10-CM | POA: Diagnosis not present

## 2021-04-04 DIAGNOSIS — E8770 Fluid overload, unspecified: Secondary | ICD-10-CM | POA: Diagnosis not present

## 2021-04-04 DIAGNOSIS — C9002 Multiple myeloma in relapse: Secondary | ICD-10-CM | POA: Diagnosis not present

## 2021-04-04 DIAGNOSIS — Z79899 Other long term (current) drug therapy: Secondary | ICD-10-CM | POA: Diagnosis not present

## 2021-04-04 DIAGNOSIS — R053 Chronic cough: Secondary | ICD-10-CM | POA: Diagnosis not present

## 2021-04-04 DIAGNOSIS — D696 Thrombocytopenia, unspecified: Secondary | ICD-10-CM | POA: Diagnosis not present

## 2021-04-04 DIAGNOSIS — Z452 Encounter for adjustment and management of vascular access device: Secondary | ICD-10-CM | POA: Diagnosis not present

## 2021-04-04 DIAGNOSIS — B957 Other staphylococcus as the cause of diseases classified elsewhere: Secondary | ICD-10-CM | POA: Diagnosis not present

## 2021-04-04 DIAGNOSIS — I517 Cardiomegaly: Secondary | ICD-10-CM | POA: Diagnosis not present

## 2021-04-04 DIAGNOSIS — D649 Anemia, unspecified: Secondary | ICD-10-CM | POA: Diagnosis not present

## 2021-04-04 DIAGNOSIS — T8092XA Unspecified transfusion reaction, initial encounter: Secondary | ICD-10-CM | POA: Diagnosis not present

## 2021-04-04 DIAGNOSIS — Z981 Arthrodesis status: Secondary | ICD-10-CM | POA: Diagnosis not present

## 2021-04-04 DIAGNOSIS — N281 Cyst of kidney, acquired: Secondary | ICD-10-CM | POA: Diagnosis not present

## 2021-04-04 DIAGNOSIS — Z20822 Contact with and (suspected) exposure to covid-19: Secondary | ICD-10-CM | POA: Diagnosis not present

## 2021-04-04 DIAGNOSIS — R5382 Chronic fatigue, unspecified: Secondary | ICD-10-CM | POA: Diagnosis not present

## 2021-04-04 DIAGNOSIS — R7881 Bacteremia: Secondary | ICD-10-CM | POA: Diagnosis not present

## 2021-04-04 DIAGNOSIS — R768 Other specified abnormal immunological findings in serum: Secondary | ICD-10-CM | POA: Diagnosis not present

## 2021-04-04 DIAGNOSIS — N2889 Other specified disorders of kidney and ureter: Secondary | ICD-10-CM | POA: Diagnosis not present

## 2021-04-04 DIAGNOSIS — N179 Acute kidney failure, unspecified: Secondary | ICD-10-CM | POA: Diagnosis not present

## 2021-04-04 DIAGNOSIS — B9689 Other specified bacterial agents as the cause of diseases classified elsewhere: Secondary | ICD-10-CM | POA: Diagnosis not present

## 2021-04-04 DIAGNOSIS — Z5111 Encounter for antineoplastic chemotherapy: Secondary | ICD-10-CM | POA: Diagnosis not present

## 2021-04-04 DIAGNOSIS — Z5181 Encounter for therapeutic drug level monitoring: Secondary | ICD-10-CM | POA: Diagnosis not present

## 2021-04-04 DIAGNOSIS — C9000 Multiple myeloma not having achieved remission: Secondary | ICD-10-CM | POA: Diagnosis not present

## 2021-04-04 DIAGNOSIS — E883 Tumor lysis syndrome: Secondary | ICD-10-CM | POA: Diagnosis not present

## 2021-04-04 DIAGNOSIS — G629 Polyneuropathy, unspecified: Secondary | ICD-10-CM | POA: Diagnosis not present

## 2021-04-04 DIAGNOSIS — B9789 Other viral agents as the cause of diseases classified elsewhere: Secondary | ICD-10-CM | POA: Diagnosis not present

## 2021-04-05 DIAGNOSIS — Z79899 Other long term (current) drug therapy: Secondary | ICD-10-CM | POA: Diagnosis not present

## 2021-04-05 DIAGNOSIS — Z5111 Encounter for antineoplastic chemotherapy: Secondary | ICD-10-CM | POA: Diagnosis not present

## 2021-04-05 DIAGNOSIS — Z5181 Encounter for therapeutic drug level monitoring: Secondary | ICD-10-CM | POA: Diagnosis not present

## 2021-04-05 DIAGNOSIS — C9000 Multiple myeloma not having achieved remission: Secondary | ICD-10-CM | POA: Diagnosis not present

## 2021-04-05 DIAGNOSIS — C9002 Multiple myeloma in relapse: Secondary | ICD-10-CM | POA: Diagnosis not present

## 2021-04-06 DIAGNOSIS — C9000 Multiple myeloma not having achieved remission: Secondary | ICD-10-CM | POA: Diagnosis not present

## 2021-05-03 DIAGNOSIS — C9002 Multiple myeloma in relapse: Secondary | ICD-10-CM | POA: Diagnosis not present

## 2021-05-03 DIAGNOSIS — R0601 Orthopnea: Secondary | ICD-10-CM | POA: Diagnosis not present

## 2021-05-03 DIAGNOSIS — R600 Localized edema: Secondary | ICD-10-CM | POA: Diagnosis not present

## 2021-05-03 DIAGNOSIS — Z981 Arthrodesis status: Secondary | ICD-10-CM | POA: Diagnosis not present

## 2021-05-03 DIAGNOSIS — M7989 Other specified soft tissue disorders: Secondary | ICD-10-CM | POA: Diagnosis not present

## 2021-05-03 DIAGNOSIS — R0602 Shortness of breath: Secondary | ICD-10-CM | POA: Diagnosis not present

## 2021-05-03 DIAGNOSIS — C9001 Multiple myeloma in remission: Secondary | ICD-10-CM | POA: Diagnosis not present

## 2021-05-03 DIAGNOSIS — Z79899 Other long term (current) drug therapy: Secondary | ICD-10-CM | POA: Diagnosis not present

## 2021-05-03 DIAGNOSIS — Z8616 Personal history of COVID-19: Secondary | ICD-10-CM | POA: Diagnosis not present

## 2021-05-03 DIAGNOSIS — T451X5D Adverse effect of antineoplastic and immunosuppressive drugs, subsequent encounter: Secondary | ICD-10-CM | POA: Diagnosis not present

## 2021-05-04 DIAGNOSIS — R0601 Orthopnea: Secondary | ICD-10-CM | POA: Diagnosis not present

## 2021-05-04 DIAGNOSIS — C9001 Multiple myeloma in remission: Secondary | ICD-10-CM | POA: Diagnosis not present

## 2021-05-04 DIAGNOSIS — R600 Localized edema: Secondary | ICD-10-CM | POA: Diagnosis not present

## 2021-05-05 DIAGNOSIS — R0601 Orthopnea: Secondary | ICD-10-CM | POA: Diagnosis not present

## 2021-05-05 DIAGNOSIS — C9001 Multiple myeloma in remission: Secondary | ICD-10-CM | POA: Diagnosis not present

## 2021-05-05 DIAGNOSIS — R600 Localized edema: Secondary | ICD-10-CM | POA: Diagnosis not present

## 2021-05-10 DIAGNOSIS — C9000 Multiple myeloma not having achieved remission: Secondary | ICD-10-CM | POA: Diagnosis not present

## 2021-05-10 DIAGNOSIS — R0602 Shortness of breath: Secondary | ICD-10-CM | POA: Diagnosis not present

## 2021-05-10 DIAGNOSIS — C9002 Multiple myeloma in relapse: Secondary | ICD-10-CM | POA: Diagnosis not present

## 2021-05-17 DIAGNOSIS — C9002 Multiple myeloma in relapse: Secondary | ICD-10-CM | POA: Diagnosis not present

## 2021-05-17 DIAGNOSIS — C9000 Multiple myeloma not having achieved remission: Secondary | ICD-10-CM | POA: Diagnosis not present

## 2021-05-20 DIAGNOSIS — I2510 Atherosclerotic heart disease of native coronary artery without angina pectoris: Secondary | ICD-10-CM | POA: Diagnosis not present

## 2021-05-20 DIAGNOSIS — Z23 Encounter for immunization: Secondary | ICD-10-CM | POA: Diagnosis not present

## 2021-05-20 DIAGNOSIS — R0602 Shortness of breath: Secondary | ICD-10-CM | POA: Diagnosis not present

## 2021-05-24 DIAGNOSIS — T451X5A Adverse effect of antineoplastic and immunosuppressive drugs, initial encounter: Secondary | ICD-10-CM | POA: Diagnosis not present

## 2021-05-24 DIAGNOSIS — C9002 Multiple myeloma in relapse: Secondary | ICD-10-CM | POA: Diagnosis not present

## 2021-05-24 DIAGNOSIS — D701 Agranulocytosis secondary to cancer chemotherapy: Secondary | ICD-10-CM | POA: Diagnosis not present

## 2021-05-24 DIAGNOSIS — Z5112 Encounter for antineoplastic immunotherapy: Secondary | ICD-10-CM | POA: Diagnosis not present

## 2021-05-31 DIAGNOSIS — T451X5A Adverse effect of antineoplastic and immunosuppressive drugs, initial encounter: Secondary | ICD-10-CM | POA: Diagnosis not present

## 2021-05-31 DIAGNOSIS — D701 Agranulocytosis secondary to cancer chemotherapy: Secondary | ICD-10-CM | POA: Diagnosis not present

## 2021-05-31 DIAGNOSIS — C9002 Multiple myeloma in relapse: Secondary | ICD-10-CM | POA: Diagnosis not present

## 2021-05-31 DIAGNOSIS — Z5112 Encounter for antineoplastic immunotherapy: Secondary | ICD-10-CM | POA: Diagnosis not present

## 2021-06-07 DIAGNOSIS — D701 Agranulocytosis secondary to cancer chemotherapy: Secondary | ICD-10-CM | POA: Diagnosis not present

## 2021-06-07 DIAGNOSIS — C9002 Multiple myeloma in relapse: Secondary | ICD-10-CM | POA: Diagnosis not present

## 2021-06-07 DIAGNOSIS — T451X5A Adverse effect of antineoplastic and immunosuppressive drugs, initial encounter: Secondary | ICD-10-CM | POA: Diagnosis not present

## 2021-06-07 DIAGNOSIS — Z5112 Encounter for antineoplastic immunotherapy: Secondary | ICD-10-CM | POA: Diagnosis not present

## 2021-06-07 DIAGNOSIS — Z9481 Bone marrow transplant status: Secondary | ICD-10-CM | POA: Diagnosis not present

## 2021-06-08 DIAGNOSIS — I2584 Coronary atherosclerosis due to calcified coronary lesion: Secondary | ICD-10-CM | POA: Diagnosis not present

## 2021-06-08 DIAGNOSIS — I2510 Atherosclerotic heart disease of native coronary artery without angina pectoris: Secondary | ICD-10-CM | POA: Diagnosis not present

## 2021-06-08 DIAGNOSIS — D649 Anemia, unspecified: Secondary | ICD-10-CM | POA: Diagnosis not present

## 2021-06-08 DIAGNOSIS — R0602 Shortness of breath: Secondary | ICD-10-CM | POA: Diagnosis not present

## 2021-06-16 DIAGNOSIS — R0602 Shortness of breath: Secondary | ICD-10-CM | POA: Diagnosis not present

## 2021-06-21 DIAGNOSIS — M542 Cervicalgia: Secondary | ICD-10-CM | POA: Diagnosis not present

## 2021-06-21 DIAGNOSIS — T50905D Adverse effect of unspecified drugs, medicaments and biological substances, subsequent encounter: Secondary | ICD-10-CM | POA: Diagnosis not present

## 2021-06-21 DIAGNOSIS — C9002 Multiple myeloma in relapse: Secondary | ICD-10-CM | POA: Diagnosis not present

## 2021-06-21 DIAGNOSIS — Z79899 Other long term (current) drug therapy: Secondary | ICD-10-CM | POA: Diagnosis not present

## 2021-06-21 DIAGNOSIS — M25512 Pain in left shoulder: Secondary | ICD-10-CM | POA: Diagnosis not present

## 2021-06-21 DIAGNOSIS — G620 Drug-induced polyneuropathy: Secondary | ICD-10-CM | POA: Diagnosis not present

## 2021-06-21 DIAGNOSIS — R202 Paresthesia of skin: Secondary | ICD-10-CM | POA: Diagnosis not present

## 2021-06-21 DIAGNOSIS — Z5112 Encounter for antineoplastic immunotherapy: Secondary | ICD-10-CM | POA: Diagnosis not present

## 2021-06-21 DIAGNOSIS — M25522 Pain in left elbow: Secondary | ICD-10-CM | POA: Diagnosis not present

## 2021-06-21 DIAGNOSIS — Z8616 Personal history of COVID-19: Secondary | ICD-10-CM | POA: Diagnosis not present

## 2021-06-21 DIAGNOSIS — M19012 Primary osteoarthritis, left shoulder: Secondary | ICD-10-CM | POA: Diagnosis not present

## 2021-06-21 DIAGNOSIS — Z79891 Long term (current) use of opiate analgesic: Secondary | ICD-10-CM | POA: Diagnosis not present

## 2021-06-21 DIAGNOSIS — R5383 Other fatigue: Secondary | ICD-10-CM | POA: Diagnosis not present

## 2021-06-21 DIAGNOSIS — G8929 Other chronic pain: Secondary | ICD-10-CM | POA: Diagnosis not present

## 2021-06-21 DIAGNOSIS — Z981 Arthrodesis status: Secondary | ICD-10-CM | POA: Diagnosis not present

## 2021-06-21 DIAGNOSIS — R0609 Other forms of dyspnea: Secondary | ICD-10-CM | POA: Diagnosis not present

## 2021-06-23 DIAGNOSIS — M9902 Segmental and somatic dysfunction of thoracic region: Secondary | ICD-10-CM | POA: Diagnosis not present

## 2021-06-23 DIAGNOSIS — G2589 Other specified extrapyramidal and movement disorders: Secondary | ICD-10-CM | POA: Diagnosis not present

## 2021-06-23 DIAGNOSIS — M25512 Pain in left shoulder: Secondary | ICD-10-CM | POA: Diagnosis not present

## 2021-07-01 DIAGNOSIS — B348 Other viral infections of unspecified site: Secondary | ICD-10-CM | POA: Diagnosis not present

## 2021-07-01 DIAGNOSIS — R058 Other specified cough: Secondary | ICD-10-CM | POA: Diagnosis not present

## 2021-07-01 DIAGNOSIS — C9000 Multiple myeloma not having achieved remission: Secondary | ICD-10-CM | POA: Diagnosis not present

## 2021-07-01 DIAGNOSIS — R070 Pain in throat: Secondary | ICD-10-CM | POA: Diagnosis not present

## 2021-07-01 DIAGNOSIS — N179 Acute kidney failure, unspecified: Secondary | ICD-10-CM | POA: Diagnosis not present

## 2021-07-01 DIAGNOSIS — Z87891 Personal history of nicotine dependence: Secondary | ICD-10-CM | POA: Diagnosis not present

## 2021-07-01 DIAGNOSIS — M549 Dorsalgia, unspecified: Secondary | ICD-10-CM | POA: Diagnosis not present

## 2021-07-01 DIAGNOSIS — G629 Polyneuropathy, unspecified: Secondary | ICD-10-CM | POA: Diagnosis not present

## 2021-07-01 DIAGNOSIS — R5081 Fever presenting with conditions classified elsewhere: Secondary | ICD-10-CM | POA: Diagnosis not present

## 2021-07-01 DIAGNOSIS — Z9221 Personal history of antineoplastic chemotherapy: Secondary | ICD-10-CM | POA: Diagnosis not present

## 2021-07-01 DIAGNOSIS — Z8616 Personal history of COVID-19: Secondary | ICD-10-CM | POA: Diagnosis not present

## 2021-07-01 DIAGNOSIS — Z981 Arthrodesis status: Secondary | ICD-10-CM | POA: Diagnosis not present

## 2021-07-01 DIAGNOSIS — R509 Fever, unspecified: Secondary | ICD-10-CM | POA: Diagnosis not present

## 2021-07-01 DIAGNOSIS — H109 Unspecified conjunctivitis: Secondary | ICD-10-CM | POA: Diagnosis not present

## 2021-07-01 DIAGNOSIS — Z79899 Other long term (current) drug therapy: Secondary | ICD-10-CM | POA: Diagnosis not present

## 2021-07-01 DIAGNOSIS — R918 Other nonspecific abnormal finding of lung field: Secondary | ICD-10-CM | POA: Diagnosis not present

## 2021-07-01 DIAGNOSIS — E861 Hypovolemia: Secondary | ICD-10-CM | POA: Diagnosis not present

## 2021-07-01 DIAGNOSIS — D709 Neutropenia, unspecified: Secondary | ICD-10-CM | POA: Diagnosis not present

## 2021-07-01 DIAGNOSIS — G8929 Other chronic pain: Secondary | ICD-10-CM | POA: Diagnosis not present

## 2021-07-01 DIAGNOSIS — Z20822 Contact with and (suspected) exposure to covid-19: Secondary | ICD-10-CM | POA: Diagnosis not present

## 2021-07-07 ENCOUNTER — Telehealth: Payer: Self-pay

## 2021-07-07 NOTE — Telephone Encounter (Signed)
Transition Care Management Follow-up Telephone Call Date of discharge and from where: 07/06/21 from [place] How have you been since you were released from the hospital? Doing ok Any questions or concerns? No  Items Reviewed: Did the pt receive and understand the discharge instructions provided? Yes  Medications obtained and verified? Yes  Other? No  Any new allergies since your discharge? No  Dietary orders reviewed? No Do you have support at home? Yes   Home Care and Equipment/Supplies: Were home health services ordered? no   Functional Questionnaire: (I = Independent and D = Dependent) ADLs: I  Bathing/Dressing- I  Meal Prep- I  Eating- I  Maintaining continence- I  Transferring/Ambulation- I  Managing Meds- I  Follow up appointments reviewed:  PCP Hospital f/u appt confirmed? No   Specialist Hospital f/u appt confirmed? Yes  Scheduled to see Oncologist on 07/11/21. Are transportation arrangements needed? No  If their condition worsens, is the pt aware to call PCP or go to the Emergency Dept.? Yes Was the patient provided with contact information for the PCP's office or ED? Yes Was to pt encouraged to call back with questions or concerns? Yes

## 2021-07-08 DIAGNOSIS — C9002 Multiple myeloma in relapse: Secondary | ICD-10-CM | POA: Diagnosis not present

## 2021-07-11 DIAGNOSIS — Z5111 Encounter for antineoplastic chemotherapy: Secondary | ICD-10-CM | POA: Diagnosis not present

## 2021-07-11 DIAGNOSIS — D7581 Myelofibrosis: Secondary | ICD-10-CM | POA: Diagnosis not present

## 2021-07-11 DIAGNOSIS — R0609 Other forms of dyspnea: Secondary | ICD-10-CM | POA: Diagnosis not present

## 2021-07-11 DIAGNOSIS — C9002 Multiple myeloma in relapse: Secondary | ICD-10-CM | POA: Diagnosis not present

## 2021-07-11 DIAGNOSIS — Z5112 Encounter for antineoplastic immunotherapy: Secondary | ICD-10-CM | POA: Diagnosis not present

## 2021-07-11 DIAGNOSIS — D61818 Other pancytopenia: Secondary | ICD-10-CM | POA: Diagnosis not present

## 2021-07-11 DIAGNOSIS — R5383 Other fatigue: Secondary | ICD-10-CM | POA: Diagnosis not present

## 2021-07-11 DIAGNOSIS — U071 COVID-19: Secondary | ICD-10-CM | POA: Diagnosis not present

## 2021-07-11 DIAGNOSIS — G629 Polyneuropathy, unspecified: Secondary | ICD-10-CM | POA: Diagnosis not present

## 2021-07-11 DIAGNOSIS — Z7982 Long term (current) use of aspirin: Secondary | ICD-10-CM | POA: Diagnosis not present

## 2021-07-11 DIAGNOSIS — M542 Cervicalgia: Secondary | ICD-10-CM | POA: Diagnosis not present

## 2021-07-11 DIAGNOSIS — C9000 Multiple myeloma not having achieved remission: Secondary | ICD-10-CM | POA: Diagnosis not present

## 2021-07-11 DIAGNOSIS — M25512 Pain in left shoulder: Secondary | ICD-10-CM | POA: Diagnosis not present

## 2021-07-14 DIAGNOSIS — E878 Other disorders of electrolyte and fluid balance, not elsewhere classified: Secondary | ICD-10-CM | POA: Diagnosis not present

## 2021-07-14 DIAGNOSIS — R29818 Other symptoms and signs involving the nervous system: Secondary | ICD-10-CM | POA: Diagnosis not present

## 2021-07-14 DIAGNOSIS — D61810 Antineoplastic chemotherapy induced pancytopenia: Secondary | ICD-10-CM | POA: Diagnosis not present

## 2021-07-14 DIAGNOSIS — G629 Polyneuropathy, unspecified: Secondary | ICD-10-CM | POA: Diagnosis not present

## 2021-07-14 DIAGNOSIS — R338 Other retention of urine: Secondary | ICD-10-CM | POA: Diagnosis not present

## 2021-07-14 DIAGNOSIS — Z9221 Personal history of antineoplastic chemotherapy: Secondary | ICD-10-CM | POA: Diagnosis not present

## 2021-07-14 DIAGNOSIS — E2740 Unspecified adrenocortical insufficiency: Secondary | ICD-10-CM | POA: Diagnosis not present

## 2021-07-14 DIAGNOSIS — R634 Abnormal weight loss: Secondary | ICD-10-CM | POA: Diagnosis not present

## 2021-07-14 DIAGNOSIS — R339 Retention of urine, unspecified: Secondary | ICD-10-CM | POA: Diagnosis not present

## 2021-07-14 DIAGNOSIS — R000 Tachycardia, unspecified: Secondary | ICD-10-CM | POA: Diagnosis not present

## 2021-07-14 DIAGNOSIS — C8590 Non-Hodgkin lymphoma, unspecified, unspecified site: Secondary | ICD-10-CM | POA: Diagnosis not present

## 2021-07-14 DIAGNOSIS — D61818 Other pancytopenia: Secondary | ICD-10-CM | POA: Diagnosis not present

## 2021-07-14 DIAGNOSIS — R570 Cardiogenic shock: Secondary | ICD-10-CM | POA: Diagnosis not present

## 2021-07-14 DIAGNOSIS — T451X5A Adverse effect of antineoplastic and immunosuppressive drugs, initial encounter: Secondary | ICD-10-CM | POA: Diagnosis not present

## 2021-07-14 DIAGNOSIS — R29898 Other symptoms and signs involving the musculoskeletal system: Secondary | ICD-10-CM | POA: Diagnosis not present

## 2021-07-14 DIAGNOSIS — N138 Other obstructive and reflux uropathy: Secondary | ICD-10-CM | POA: Diagnosis not present

## 2021-07-14 DIAGNOSIS — E883 Tumor lysis syndrome: Secondary | ICD-10-CM | POA: Diagnosis not present

## 2021-07-14 DIAGNOSIS — D649 Anemia, unspecified: Secondary | ICD-10-CM | POA: Diagnosis not present

## 2021-07-14 DIAGNOSIS — E872 Acidosis, unspecified: Secondary | ICD-10-CM | POA: Diagnosis not present

## 2021-07-14 DIAGNOSIS — E8809 Other disorders of plasma-protein metabolism, not elsewhere classified: Secondary | ICD-10-CM | POA: Diagnosis not present

## 2021-07-14 DIAGNOSIS — Z9189 Other specified personal risk factors, not elsewhere classified: Secondary | ICD-10-CM | POA: Diagnosis not present

## 2021-07-14 DIAGNOSIS — E43 Unspecified severe protein-calorie malnutrition: Secondary | ICD-10-CM | POA: Diagnosis not present

## 2021-07-14 DIAGNOSIS — Z8616 Personal history of COVID-19: Secondary | ICD-10-CM | POA: Diagnosis not present

## 2021-07-14 DIAGNOSIS — D84821 Immunodeficiency due to drugs: Secondary | ICD-10-CM | POA: Diagnosis not present

## 2021-07-14 DIAGNOSIS — R5381 Other malaise: Secondary | ICD-10-CM | POA: Diagnosis not present

## 2021-07-14 DIAGNOSIS — D709 Neutropenia, unspecified: Secondary | ICD-10-CM | POA: Diagnosis not present

## 2021-07-14 DIAGNOSIS — N179 Acute kidney failure, unspecified: Secondary | ICD-10-CM | POA: Diagnosis not present

## 2021-07-14 DIAGNOSIS — M7989 Other specified soft tissue disorders: Secondary | ICD-10-CM | POA: Diagnosis not present

## 2021-07-14 DIAGNOSIS — Z20822 Contact with and (suspected) exposure to covid-19: Secondary | ICD-10-CM | POA: Diagnosis not present

## 2021-07-14 DIAGNOSIS — G893 Neoplasm related pain (acute) (chronic): Secondary | ICD-10-CM | POA: Diagnosis not present

## 2021-07-14 DIAGNOSIS — E875 Hyperkalemia: Secondary | ICD-10-CM | POA: Diagnosis not present

## 2021-07-14 DIAGNOSIS — C9002 Multiple myeloma in relapse: Secondary | ICD-10-CM | POA: Diagnosis not present

## 2021-07-14 DIAGNOSIS — J811 Chronic pulmonary edema: Secondary | ICD-10-CM | POA: Diagnosis not present

## 2021-07-14 DIAGNOSIS — G620 Drug-induced polyneuropathy: Secondary | ICD-10-CM | POA: Diagnosis not present

## 2021-07-14 DIAGNOSIS — Z9481 Bone marrow transplant status: Secondary | ICD-10-CM | POA: Diagnosis not present

## 2021-07-14 DIAGNOSIS — C9020 Extramedullary plasmacytoma not having achieved remission: Secondary | ICD-10-CM | POA: Diagnosis not present

## 2021-07-14 DIAGNOSIS — C9000 Multiple myeloma not having achieved remission: Secondary | ICD-10-CM | POA: Diagnosis not present

## 2021-07-14 DIAGNOSIS — R1909 Other intra-abdominal and pelvic swelling, mass and lump: Secondary | ICD-10-CM | POA: Diagnosis not present

## 2021-07-14 DIAGNOSIS — C480 Malignant neoplasm of retroperitoneum: Secondary | ICD-10-CM | POA: Diagnosis not present

## 2021-07-14 DIAGNOSIS — C7951 Secondary malignant neoplasm of bone: Secondary | ICD-10-CM | POA: Diagnosis not present

## 2021-07-14 DIAGNOSIS — E861 Hypovolemia: Secondary | ICD-10-CM | POA: Diagnosis not present

## 2021-07-14 DIAGNOSIS — Z5111 Encounter for antineoplastic chemotherapy: Secondary | ICD-10-CM | POA: Diagnosis not present

## 2021-07-14 DIAGNOSIS — E278 Other specified disorders of adrenal gland: Secondary | ICD-10-CM | POA: Diagnosis not present

## 2021-07-14 DIAGNOSIS — R188 Other ascites: Secondary | ICD-10-CM | POA: Diagnosis not present

## 2021-07-14 DIAGNOSIS — I9589 Other hypotension: Secondary | ICD-10-CM | POA: Diagnosis not present

## 2021-07-14 DIAGNOSIS — C969 Malignant neoplasm of lymphoid, hematopoietic and related tissue, unspecified: Secondary | ICD-10-CM | POA: Diagnosis not present

## 2021-07-14 DIAGNOSIS — G9520 Unspecified cord compression: Secondary | ICD-10-CM | POA: Diagnosis not present

## 2021-07-15 DIAGNOSIS — R29818 Other symptoms and signs involving the nervous system: Secondary | ICD-10-CM | POA: Diagnosis not present

## 2021-07-15 DIAGNOSIS — M7989 Other specified soft tissue disorders: Secondary | ICD-10-CM | POA: Diagnosis not present

## 2021-07-15 DIAGNOSIS — C9002 Multiple myeloma in relapse: Secondary | ICD-10-CM | POA: Diagnosis not present

## 2021-07-16 DIAGNOSIS — N179 Acute kidney failure, unspecified: Secondary | ICD-10-CM | POA: Diagnosis not present

## 2021-07-16 DIAGNOSIS — G9520 Unspecified cord compression: Secondary | ICD-10-CM | POA: Diagnosis not present

## 2021-07-16 DIAGNOSIS — R338 Other retention of urine: Secondary | ICD-10-CM | POA: Diagnosis not present

## 2021-07-16 DIAGNOSIS — R29898 Other symptoms and signs involving the musculoskeletal system: Secondary | ICD-10-CM | POA: Diagnosis not present

## 2021-07-16 DIAGNOSIS — C9000 Multiple myeloma not having achieved remission: Secondary | ICD-10-CM | POA: Diagnosis not present

## 2021-07-16 DIAGNOSIS — C9002 Multiple myeloma in relapse: Secondary | ICD-10-CM | POA: Diagnosis not present

## 2021-07-17 DIAGNOSIS — R338 Other retention of urine: Secondary | ICD-10-CM | POA: Diagnosis not present

## 2021-07-17 DIAGNOSIS — C9002 Multiple myeloma in relapse: Secondary | ICD-10-CM | POA: Diagnosis not present

## 2021-07-17 DIAGNOSIS — R29898 Other symptoms and signs involving the musculoskeletal system: Secondary | ICD-10-CM | POA: Diagnosis not present

## 2021-07-17 DIAGNOSIS — N179 Acute kidney failure, unspecified: Secondary | ICD-10-CM | POA: Diagnosis not present

## 2021-07-17 DIAGNOSIS — G9520 Unspecified cord compression: Secondary | ICD-10-CM | POA: Diagnosis not present

## 2021-07-17 DIAGNOSIS — C7951 Secondary malignant neoplasm of bone: Secondary | ICD-10-CM | POA: Diagnosis not present

## 2021-07-18 DIAGNOSIS — Z5111 Encounter for antineoplastic chemotherapy: Secondary | ICD-10-CM | POA: Diagnosis not present

## 2021-07-18 DIAGNOSIS — R29898 Other symptoms and signs involving the musculoskeletal system: Secondary | ICD-10-CM | POA: Diagnosis not present

## 2021-07-18 DIAGNOSIS — Z9189 Other specified personal risk factors, not elsewhere classified: Secondary | ICD-10-CM | POA: Diagnosis not present

## 2021-07-18 DIAGNOSIS — C9000 Multiple myeloma not having achieved remission: Secondary | ICD-10-CM | POA: Diagnosis not present

## 2021-07-18 DIAGNOSIS — R338 Other retention of urine: Secondary | ICD-10-CM | POA: Diagnosis not present

## 2021-07-18 DIAGNOSIS — G9520 Unspecified cord compression: Secondary | ICD-10-CM | POA: Diagnosis not present

## 2021-07-18 DIAGNOSIS — C9002 Multiple myeloma in relapse: Secondary | ICD-10-CM | POA: Diagnosis not present

## 2021-07-18 DIAGNOSIS — N179 Acute kidney failure, unspecified: Secondary | ICD-10-CM | POA: Diagnosis not present

## 2021-07-19 DIAGNOSIS — N179 Acute kidney failure, unspecified: Secondary | ICD-10-CM | POA: Diagnosis not present

## 2021-07-19 DIAGNOSIS — R338 Other retention of urine: Secondary | ICD-10-CM | POA: Diagnosis not present

## 2021-07-19 DIAGNOSIS — G9520 Unspecified cord compression: Secondary | ICD-10-CM | POA: Diagnosis not present

## 2021-07-19 DIAGNOSIS — C9000 Multiple myeloma not having achieved remission: Secondary | ICD-10-CM | POA: Diagnosis not present

## 2021-07-19 DIAGNOSIS — R29898 Other symptoms and signs involving the musculoskeletal system: Secondary | ICD-10-CM | POA: Diagnosis not present

## 2021-07-19 DIAGNOSIS — C9002 Multiple myeloma in relapse: Secondary | ICD-10-CM | POA: Diagnosis not present

## 2021-07-20 DIAGNOSIS — R29898 Other symptoms and signs involving the musculoskeletal system: Secondary | ICD-10-CM | POA: Diagnosis not present

## 2021-07-20 DIAGNOSIS — G9520 Unspecified cord compression: Secondary | ICD-10-CM | POA: Diagnosis not present

## 2021-07-20 DIAGNOSIS — N179 Acute kidney failure, unspecified: Secondary | ICD-10-CM | POA: Diagnosis not present

## 2021-07-20 DIAGNOSIS — C9002 Multiple myeloma in relapse: Secondary | ICD-10-CM | POA: Diagnosis not present

## 2021-07-20 DIAGNOSIS — R338 Other retention of urine: Secondary | ICD-10-CM | POA: Diagnosis not present

## 2021-07-21 DIAGNOSIS — R338 Other retention of urine: Secondary | ICD-10-CM | POA: Diagnosis not present

## 2021-07-21 DIAGNOSIS — G9520 Unspecified cord compression: Secondary | ICD-10-CM | POA: Diagnosis not present

## 2021-07-21 DIAGNOSIS — R29898 Other symptoms and signs involving the musculoskeletal system: Secondary | ICD-10-CM | POA: Diagnosis not present

## 2021-07-21 DIAGNOSIS — C9002 Multiple myeloma in relapse: Secondary | ICD-10-CM | POA: Diagnosis not present

## 2021-07-21 DIAGNOSIS — N179 Acute kidney failure, unspecified: Secondary | ICD-10-CM | POA: Diagnosis not present

## 2021-07-22 DIAGNOSIS — N179 Acute kidney failure, unspecified: Secondary | ICD-10-CM | POA: Diagnosis not present

## 2021-07-22 DIAGNOSIS — R338 Other retention of urine: Secondary | ICD-10-CM | POA: Diagnosis not present

## 2021-07-22 DIAGNOSIS — C9002 Multiple myeloma in relapse: Secondary | ICD-10-CM | POA: Diagnosis not present

## 2021-07-22 DIAGNOSIS — G9520 Unspecified cord compression: Secondary | ICD-10-CM | POA: Diagnosis not present

## 2021-07-22 DIAGNOSIS — Z9189 Other specified personal risk factors, not elsewhere classified: Secondary | ICD-10-CM | POA: Diagnosis not present

## 2021-07-22 DIAGNOSIS — R29898 Other symptoms and signs involving the musculoskeletal system: Secondary | ICD-10-CM | POA: Diagnosis not present

## 2021-07-22 DIAGNOSIS — Z5111 Encounter for antineoplastic chemotherapy: Secondary | ICD-10-CM | POA: Diagnosis not present

## 2021-07-23 DIAGNOSIS — R29898 Other symptoms and signs involving the musculoskeletal system: Secondary | ICD-10-CM | POA: Diagnosis not present

## 2021-07-24 DIAGNOSIS — R29898 Other symptoms and signs involving the musculoskeletal system: Secondary | ICD-10-CM | POA: Diagnosis not present

## 2021-07-25 DIAGNOSIS — C9002 Multiple myeloma in relapse: Secondary | ICD-10-CM | POA: Diagnosis not present

## 2021-07-25 DIAGNOSIS — R29898 Other symptoms and signs involving the musculoskeletal system: Secondary | ICD-10-CM | POA: Diagnosis not present

## 2021-07-26 DIAGNOSIS — C9002 Multiple myeloma in relapse: Secondary | ICD-10-CM | POA: Diagnosis not present

## 2021-07-26 DIAGNOSIS — R29898 Other symptoms and signs involving the musculoskeletal system: Secondary | ICD-10-CM | POA: Diagnosis not present

## 2021-07-27 DIAGNOSIS — D61818 Other pancytopenia: Secondary | ICD-10-CM | POA: Diagnosis not present

## 2021-07-27 DIAGNOSIS — N179 Acute kidney failure, unspecified: Secondary | ICD-10-CM | POA: Diagnosis not present

## 2021-07-27 DIAGNOSIS — R29898 Other symptoms and signs involving the musculoskeletal system: Secondary | ICD-10-CM | POA: Diagnosis not present

## 2021-07-27 DIAGNOSIS — C9002 Multiple myeloma in relapse: Secondary | ICD-10-CM | POA: Diagnosis not present

## 2021-07-27 DIAGNOSIS — E883 Tumor lysis syndrome: Secondary | ICD-10-CM | POA: Diagnosis not present

## 2021-07-27 DIAGNOSIS — I9589 Other hypotension: Secondary | ICD-10-CM | POA: Diagnosis not present

## 2021-07-27 DIAGNOSIS — E861 Hypovolemia: Secondary | ICD-10-CM | POA: Diagnosis not present

## 2021-07-28 DIAGNOSIS — I9589 Other hypotension: Secondary | ICD-10-CM | POA: Diagnosis not present

## 2021-07-28 DIAGNOSIS — D61818 Other pancytopenia: Secondary | ICD-10-CM | POA: Diagnosis not present

## 2021-07-28 DIAGNOSIS — R29898 Other symptoms and signs involving the musculoskeletal system: Secondary | ICD-10-CM | POA: Diagnosis not present

## 2021-07-28 DIAGNOSIS — N179 Acute kidney failure, unspecified: Secondary | ICD-10-CM | POA: Diagnosis not present

## 2021-07-28 DIAGNOSIS — E883 Tumor lysis syndrome: Secondary | ICD-10-CM | POA: Diagnosis not present

## 2021-07-28 DIAGNOSIS — C9002 Multiple myeloma in relapse: Secondary | ICD-10-CM | POA: Diagnosis not present

## 2021-07-29 DIAGNOSIS — N179 Acute kidney failure, unspecified: Secondary | ICD-10-CM | POA: Diagnosis not present

## 2021-07-29 DIAGNOSIS — I9589 Other hypotension: Secondary | ICD-10-CM | POA: Diagnosis not present

## 2021-07-29 DIAGNOSIS — E883 Tumor lysis syndrome: Secondary | ICD-10-CM | POA: Diagnosis not present

## 2021-07-29 DIAGNOSIS — D61818 Other pancytopenia: Secondary | ICD-10-CM | POA: Diagnosis not present

## 2021-07-29 DIAGNOSIS — C9002 Multiple myeloma in relapse: Secondary | ICD-10-CM | POA: Diagnosis not present

## 2021-07-29 DIAGNOSIS — R29898 Other symptoms and signs involving the musculoskeletal system: Secondary | ICD-10-CM | POA: Diagnosis not present

## 2021-07-30 DIAGNOSIS — R29898 Other symptoms and signs involving the musculoskeletal system: Secondary | ICD-10-CM | POA: Diagnosis not present

## 2021-07-30 DIAGNOSIS — C9002 Multiple myeloma in relapse: Secondary | ICD-10-CM | POA: Diagnosis not present

## 2021-07-31 DIAGNOSIS — R29898 Other symptoms and signs involving the musculoskeletal system: Secondary | ICD-10-CM | POA: Diagnosis not present

## 2021-08-01 DIAGNOSIS — R29898 Other symptoms and signs involving the musculoskeletal system: Secondary | ICD-10-CM | POA: Diagnosis not present

## 2021-08-02 DIAGNOSIS — R29898 Other symptoms and signs involving the musculoskeletal system: Secondary | ICD-10-CM | POA: Diagnosis not present

## 2021-08-03 DIAGNOSIS — R29898 Other symptoms and signs involving the musculoskeletal system: Secondary | ICD-10-CM | POA: Diagnosis not present

## 2021-08-04 DIAGNOSIS — R29898 Other symptoms and signs involving the musculoskeletal system: Secondary | ICD-10-CM | POA: Diagnosis not present

## 2021-08-05 DIAGNOSIS — R29898 Other symptoms and signs involving the musculoskeletal system: Secondary | ICD-10-CM | POA: Diagnosis not present

## 2021-08-06 DIAGNOSIS — C9002 Multiple myeloma in relapse: Secondary | ICD-10-CM | POA: Diagnosis not present

## 2021-08-06 DIAGNOSIS — T451X5A Adverse effect of antineoplastic and immunosuppressive drugs, initial encounter: Secondary | ICD-10-CM | POA: Diagnosis not present

## 2021-08-06 DIAGNOSIS — D61810 Antineoplastic chemotherapy induced pancytopenia: Secondary | ICD-10-CM | POA: Diagnosis not present

## 2021-08-06 DIAGNOSIS — Z5111 Encounter for antineoplastic chemotherapy: Secondary | ICD-10-CM | POA: Diagnosis not present

## 2021-08-06 DIAGNOSIS — R29898 Other symptoms and signs involving the musculoskeletal system: Secondary | ICD-10-CM | POA: Diagnosis not present

## 2021-08-06 DIAGNOSIS — C9020 Extramedullary plasmacytoma not having achieved remission: Secondary | ICD-10-CM | POA: Diagnosis not present

## 2021-08-07 DIAGNOSIS — C9020 Extramedullary plasmacytoma not having achieved remission: Secondary | ICD-10-CM | POA: Diagnosis not present

## 2021-08-07 DIAGNOSIS — R29898 Other symptoms and signs involving the musculoskeletal system: Secondary | ICD-10-CM | POA: Diagnosis not present

## 2021-08-07 DIAGNOSIS — C9002 Multiple myeloma in relapse: Secondary | ICD-10-CM | POA: Diagnosis not present

## 2021-08-07 DIAGNOSIS — Z5111 Encounter for antineoplastic chemotherapy: Secondary | ICD-10-CM | POA: Diagnosis not present

## 2021-08-07 DIAGNOSIS — T451X5A Adverse effect of antineoplastic and immunosuppressive drugs, initial encounter: Secondary | ICD-10-CM | POA: Diagnosis not present

## 2021-08-07 DIAGNOSIS — D61810 Antineoplastic chemotherapy induced pancytopenia: Secondary | ICD-10-CM | POA: Diagnosis not present

## 2021-08-08 DIAGNOSIS — C9002 Multiple myeloma in relapse: Secondary | ICD-10-CM | POA: Diagnosis not present

## 2021-08-08 DIAGNOSIS — D61810 Antineoplastic chemotherapy induced pancytopenia: Secondary | ICD-10-CM | POA: Diagnosis not present

## 2021-08-08 DIAGNOSIS — C9020 Extramedullary plasmacytoma not having achieved remission: Secondary | ICD-10-CM | POA: Diagnosis not present

## 2021-08-08 DIAGNOSIS — R29898 Other symptoms and signs involving the musculoskeletal system: Secondary | ICD-10-CM | POA: Diagnosis not present

## 2021-08-08 DIAGNOSIS — R000 Tachycardia, unspecified: Secondary | ICD-10-CM | POA: Diagnosis not present

## 2021-08-08 DIAGNOSIS — C969 Malignant neoplasm of lymphoid, hematopoietic and related tissue, unspecified: Secondary | ICD-10-CM | POA: Diagnosis not present

## 2021-08-08 DIAGNOSIS — R188 Other ascites: Secondary | ICD-10-CM | POA: Diagnosis not present

## 2021-08-09 DIAGNOSIS — D61810 Antineoplastic chemotherapy induced pancytopenia: Secondary | ICD-10-CM | POA: Diagnosis not present

## 2021-08-09 DIAGNOSIS — C9020 Extramedullary plasmacytoma not having achieved remission: Secondary | ICD-10-CM | POA: Diagnosis not present

## 2021-08-09 DIAGNOSIS — C9002 Multiple myeloma in relapse: Secondary | ICD-10-CM | POA: Diagnosis not present

## 2021-08-09 DIAGNOSIS — R29898 Other symptoms and signs involving the musculoskeletal system: Secondary | ICD-10-CM | POA: Diagnosis not present

## 2021-08-10 DIAGNOSIS — R29898 Other symptoms and signs involving the musculoskeletal system: Secondary | ICD-10-CM | POA: Diagnosis not present

## 2021-08-10 DIAGNOSIS — R000 Tachycardia, unspecified: Secondary | ICD-10-CM | POA: Diagnosis not present

## 2021-08-10 DIAGNOSIS — C9002 Multiple myeloma in relapse: Secondary | ICD-10-CM | POA: Diagnosis not present

## 2021-08-10 DIAGNOSIS — C9020 Extramedullary plasmacytoma not having achieved remission: Secondary | ICD-10-CM | POA: Diagnosis not present

## 2021-08-10 DIAGNOSIS — D61810 Antineoplastic chemotherapy induced pancytopenia: Secondary | ICD-10-CM | POA: Diagnosis not present

## 2021-08-11 DIAGNOSIS — C9020 Extramedullary plasmacytoma not having achieved remission: Secondary | ICD-10-CM | POA: Diagnosis not present

## 2021-08-11 DIAGNOSIS — D61810 Antineoplastic chemotherapy induced pancytopenia: Secondary | ICD-10-CM | POA: Diagnosis not present

## 2021-08-11 DIAGNOSIS — R29898 Other symptoms and signs involving the musculoskeletal system: Secondary | ICD-10-CM | POA: Diagnosis not present

## 2021-08-11 DIAGNOSIS — C9002 Multiple myeloma in relapse: Secondary | ICD-10-CM | POA: Diagnosis not present

## 2021-08-12 DIAGNOSIS — C9020 Extramedullary plasmacytoma not having achieved remission: Secondary | ICD-10-CM | POA: Diagnosis not present

## 2021-08-12 DIAGNOSIS — D61810 Antineoplastic chemotherapy induced pancytopenia: Secondary | ICD-10-CM | POA: Diagnosis not present

## 2021-08-12 DIAGNOSIS — R29898 Other symptoms and signs involving the musculoskeletal system: Secondary | ICD-10-CM | POA: Diagnosis not present

## 2021-08-12 DIAGNOSIS — C9002 Multiple myeloma in relapse: Secondary | ICD-10-CM | POA: Diagnosis not present

## 2021-08-13 DIAGNOSIS — E43 Unspecified severe protein-calorie malnutrition: Secondary | ICD-10-CM | POA: Diagnosis not present

## 2021-08-13 DIAGNOSIS — G9520 Unspecified cord compression: Secondary | ICD-10-CM | POA: Diagnosis not present

## 2021-08-13 DIAGNOSIS — R29898 Other symptoms and signs involving the musculoskeletal system: Secondary | ICD-10-CM | POA: Diagnosis not present

## 2021-08-13 DIAGNOSIS — C9002 Multiple myeloma in relapse: Secondary | ICD-10-CM | POA: Diagnosis not present

## 2021-08-13 DIAGNOSIS — E8809 Other disorders of plasma-protein metabolism, not elsewhere classified: Secondary | ICD-10-CM | POA: Diagnosis not present

## 2021-08-14 DIAGNOSIS — R29898 Other symptoms and signs involving the musculoskeletal system: Secondary | ICD-10-CM | POA: Diagnosis not present

## 2021-08-14 DIAGNOSIS — E8809 Other disorders of plasma-protein metabolism, not elsewhere classified: Secondary | ICD-10-CM | POA: Diagnosis not present

## 2021-08-14 DIAGNOSIS — C9002 Multiple myeloma in relapse: Secondary | ICD-10-CM | POA: Diagnosis not present

## 2021-08-15 DIAGNOSIS — G9520 Unspecified cord compression: Secondary | ICD-10-CM | POA: Diagnosis not present

## 2021-08-15 DIAGNOSIS — E43 Unspecified severe protein-calorie malnutrition: Secondary | ICD-10-CM | POA: Diagnosis not present

## 2021-08-15 DIAGNOSIS — E8809 Other disorders of plasma-protein metabolism, not elsewhere classified: Secondary | ICD-10-CM | POA: Diagnosis not present

## 2021-08-15 DIAGNOSIS — R29898 Other symptoms and signs involving the musculoskeletal system: Secondary | ICD-10-CM | POA: Diagnosis not present

## 2021-08-15 DIAGNOSIS — C9002 Multiple myeloma in relapse: Secondary | ICD-10-CM | POA: Diagnosis not present

## 2021-08-16 DIAGNOSIS — E43 Unspecified severe protein-calorie malnutrition: Secondary | ICD-10-CM | POA: Diagnosis not present

## 2021-08-16 DIAGNOSIS — C9002 Multiple myeloma in relapse: Secondary | ICD-10-CM | POA: Diagnosis not present

## 2021-08-16 DIAGNOSIS — E8809 Other disorders of plasma-protein metabolism, not elsewhere classified: Secondary | ICD-10-CM | POA: Diagnosis not present

## 2021-08-16 DIAGNOSIS — G9520 Unspecified cord compression: Secondary | ICD-10-CM | POA: Diagnosis not present

## 2021-08-16 DIAGNOSIS — R29898 Other symptoms and signs involving the musculoskeletal system: Secondary | ICD-10-CM | POA: Diagnosis not present

## 2021-08-17 DIAGNOSIS — G9520 Unspecified cord compression: Secondary | ICD-10-CM | POA: Diagnosis not present

## 2021-08-17 DIAGNOSIS — C9002 Multiple myeloma in relapse: Secondary | ICD-10-CM | POA: Diagnosis not present

## 2021-08-17 DIAGNOSIS — R29898 Other symptoms and signs involving the musculoskeletal system: Secondary | ICD-10-CM | POA: Diagnosis not present

## 2021-08-17 DIAGNOSIS — E43 Unspecified severe protein-calorie malnutrition: Secondary | ICD-10-CM | POA: Diagnosis not present

## 2021-08-17 DIAGNOSIS — E8809 Other disorders of plasma-protein metabolism, not elsewhere classified: Secondary | ICD-10-CM | POA: Diagnosis not present

## 2021-08-18 DIAGNOSIS — E8809 Other disorders of plasma-protein metabolism, not elsewhere classified: Secondary | ICD-10-CM | POA: Diagnosis not present

## 2021-08-18 DIAGNOSIS — R29898 Other symptoms and signs involving the musculoskeletal system: Secondary | ICD-10-CM | POA: Diagnosis not present

## 2021-08-18 DIAGNOSIS — C9002 Multiple myeloma in relapse: Secondary | ICD-10-CM | POA: Diagnosis not present

## 2021-08-18 DIAGNOSIS — E43 Unspecified severe protein-calorie malnutrition: Secondary | ICD-10-CM | POA: Diagnosis not present

## 2021-08-18 DIAGNOSIS — G9520 Unspecified cord compression: Secondary | ICD-10-CM | POA: Diagnosis not present

## 2021-08-20 DIAGNOSIS — R29898 Other symptoms and signs involving the musculoskeletal system: Secondary | ICD-10-CM | POA: Diagnosis not present

## 2021-08-21 DIAGNOSIS — R29898 Other symptoms and signs involving the musculoskeletal system: Secondary | ICD-10-CM | POA: Diagnosis not present

## 2021-08-22 DIAGNOSIS — R29898 Other symptoms and signs involving the musculoskeletal system: Secondary | ICD-10-CM | POA: Diagnosis not present

## 2021-09-02 DIAGNOSIS — L89151 Pressure ulcer of sacral region, stage 1: Secondary | ICD-10-CM | POA: Diagnosis not present

## 2021-09-02 DIAGNOSIS — N319 Neuromuscular dysfunction of bladder, unspecified: Secondary | ICD-10-CM | POA: Diagnosis not present

## 2021-09-02 DIAGNOSIS — N138 Other obstructive and reflux uropathy: Secondary | ICD-10-CM | POA: Diagnosis not present

## 2021-09-02 DIAGNOSIS — R339 Retention of urine, unspecified: Secondary | ICD-10-CM | POA: Diagnosis not present

## 2021-09-02 DIAGNOSIS — R627 Adult failure to thrive: Secondary | ICD-10-CM | POA: Diagnosis not present

## 2021-09-02 DIAGNOSIS — D472 Monoclonal gammopathy: Secondary | ICD-10-CM | POA: Diagnosis not present

## 2021-09-02 DIAGNOSIS — D638 Anemia in other chronic diseases classified elsewhere: Secondary | ICD-10-CM | POA: Diagnosis not present

## 2021-09-02 DIAGNOSIS — Z8616 Personal history of COVID-19: Secondary | ICD-10-CM | POA: Diagnosis not present

## 2021-09-02 DIAGNOSIS — R5381 Other malaise: Secondary | ICD-10-CM | POA: Diagnosis not present

## 2021-09-02 DIAGNOSIS — M8458XS Pathological fracture in neoplastic disease, other specified site, sequela: Secondary | ICD-10-CM | POA: Diagnosis not present

## 2021-09-02 DIAGNOSIS — I959 Hypotension, unspecified: Secondary | ICD-10-CM | POA: Diagnosis not present

## 2021-09-02 DIAGNOSIS — Z20822 Contact with and (suspected) exposure to covid-19: Secondary | ICD-10-CM | POA: Diagnosis not present

## 2021-09-02 DIAGNOSIS — M8458XA Pathological fracture in neoplastic disease, other specified site, initial encounter for fracture: Secondary | ICD-10-CM | POA: Diagnosis not present

## 2021-09-02 DIAGNOSIS — R531 Weakness: Secondary | ICD-10-CM | POA: Diagnosis not present

## 2021-09-02 DIAGNOSIS — Z66 Do not resuscitate: Secondary | ICD-10-CM | POA: Diagnosis not present

## 2021-09-02 DIAGNOSIS — G9520 Unspecified cord compression: Secondary | ICD-10-CM | POA: Diagnosis not present

## 2021-09-02 DIAGNOSIS — C9000 Multiple myeloma not having achieved remission: Secondary | ICD-10-CM | POA: Diagnosis not present

## 2021-09-02 DIAGNOSIS — D801 Nonfamilial hypogammaglobulinemia: Secondary | ICD-10-CM | POA: Diagnosis not present

## 2021-09-02 DIAGNOSIS — D701 Agranulocytosis secondary to cancer chemotherapy: Secondary | ICD-10-CM | POA: Diagnosis not present

## 2021-09-02 DIAGNOSIS — G893 Neoplasm related pain (acute) (chronic): Secondary | ICD-10-CM | POA: Diagnosis not present

## 2021-09-02 DIAGNOSIS — D696 Thrombocytopenia, unspecified: Secondary | ICD-10-CM | POA: Diagnosis not present

## 2021-09-02 DIAGNOSIS — E43 Unspecified severe protein-calorie malnutrition: Secondary | ICD-10-CM | POA: Diagnosis not present

## 2021-09-02 DIAGNOSIS — T451X5A Adverse effect of antineoplastic and immunosuppressive drugs, initial encounter: Secondary | ICD-10-CM | POA: Diagnosis not present

## 2021-09-02 DIAGNOSIS — Z923 Personal history of irradiation: Secondary | ICD-10-CM | POA: Diagnosis not present

## 2021-09-02 DIAGNOSIS — E538 Deficiency of other specified B group vitamins: Secondary | ICD-10-CM | POA: Diagnosis not present

## 2021-09-02 DIAGNOSIS — C9002 Multiple myeloma in relapse: Secondary | ICD-10-CM | POA: Diagnosis not present

## 2021-09-02 DIAGNOSIS — J90 Pleural effusion, not elsewhere classified: Secondary | ICD-10-CM | POA: Diagnosis not present

## 2021-09-02 DIAGNOSIS — L89153 Pressure ulcer of sacral region, stage 3: Secondary | ICD-10-CM | POA: Diagnosis not present

## 2021-09-02 DIAGNOSIS — Z79899 Other long term (current) drug therapy: Secondary | ICD-10-CM | POA: Diagnosis not present

## 2021-09-02 DIAGNOSIS — M6250 Muscle wasting and atrophy, not elsewhere classified, unspecified site: Secondary | ICD-10-CM | POA: Diagnosis not present

## 2021-09-02 DIAGNOSIS — Z981 Arthrodesis status: Secondary | ICD-10-CM | POA: Diagnosis not present

## 2021-09-02 DIAGNOSIS — J9601 Acute respiratory failure with hypoxia: Secondary | ICD-10-CM | POA: Diagnosis not present

## 2021-09-02 DIAGNOSIS — D61818 Other pancytopenia: Secondary | ICD-10-CM | POA: Diagnosis not present

## 2021-09-02 DIAGNOSIS — G620 Drug-induced polyneuropathy: Secondary | ICD-10-CM | POA: Diagnosis not present

## 2021-09-02 DIAGNOSIS — R209 Unspecified disturbances of skin sensation: Secondary | ICD-10-CM | POA: Diagnosis not present

## 2021-09-02 DIAGNOSIS — Z4682 Encounter for fitting and adjustment of non-vascular catheter: Secondary | ICD-10-CM | POA: Diagnosis not present

## 2021-09-02 DIAGNOSIS — G9529 Other cord compression: Secondary | ICD-10-CM | POA: Diagnosis not present

## 2021-09-02 DIAGNOSIS — Z9481 Bone marrow transplant status: Secondary | ICD-10-CM | POA: Diagnosis not present

## 2021-09-02 DIAGNOSIS — L89316 Pressure-induced deep tissue damage of right buttock: Secondary | ICD-10-CM | POA: Diagnosis not present

## 2021-09-02 DIAGNOSIS — L89150 Pressure ulcer of sacral region, unstageable: Secondary | ICD-10-CM | POA: Diagnosis not present

## 2021-09-02 DIAGNOSIS — R5383 Other fatigue: Secondary | ICD-10-CM | POA: Diagnosis not present

## 2021-09-02 DIAGNOSIS — N390 Urinary tract infection, site not specified: Secondary | ICD-10-CM | POA: Diagnosis not present

## 2021-09-02 DIAGNOSIS — R29898 Other symptoms and signs involving the musculoskeletal system: Secondary | ICD-10-CM | POA: Diagnosis not present

## 2021-09-02 DIAGNOSIS — N1330 Unspecified hydronephrosis: Secondary | ICD-10-CM | POA: Diagnosis not present

## 2021-09-02 DIAGNOSIS — C9020 Extramedullary plasmacytoma not having achieved remission: Secondary | ICD-10-CM | POA: Diagnosis not present

## 2021-09-02 DIAGNOSIS — G4700 Insomnia, unspecified: Secondary | ICD-10-CM | POA: Diagnosis not present

## 2021-09-02 DIAGNOSIS — R718 Other abnormality of red blood cells: Secondary | ICD-10-CM | POA: Diagnosis not present

## 2021-09-02 DIAGNOSIS — M8458XD Pathological fracture in neoplastic disease, other specified site, subsequent encounter for fracture with routine healing: Secondary | ICD-10-CM | POA: Diagnosis not present

## 2021-09-02 DIAGNOSIS — Z743 Need for continuous supervision: Secondary | ICD-10-CM | POA: Diagnosis not present

## 2021-09-02 DIAGNOSIS — J690 Pneumonitis due to inhalation of food and vomit: Secondary | ICD-10-CM | POA: Diagnosis not present

## 2021-09-02 DIAGNOSIS — Z515 Encounter for palliative care: Secondary | ICD-10-CM | POA: Diagnosis not present

## 2021-09-02 DIAGNOSIS — M549 Dorsalgia, unspecified: Secondary | ICD-10-CM | POA: Diagnosis not present

## 2021-09-02 DIAGNOSIS — Z87891 Personal history of nicotine dependence: Secondary | ICD-10-CM | POA: Diagnosis not present

## 2021-09-02 DIAGNOSIS — R809 Proteinuria, unspecified: Secondary | ICD-10-CM | POA: Diagnosis not present

## 2021-09-02 DIAGNOSIS — L89156 Pressure-induced deep tissue damage of sacral region: Secondary | ICD-10-CM | POA: Diagnosis not present

## 2021-09-02 DIAGNOSIS — G8220 Paraplegia, unspecified: Secondary | ICD-10-CM | POA: Diagnosis not present

## 2021-09-14 DIAGNOSIS — G893 Neoplasm related pain (acute) (chronic): Secondary | ICD-10-CM | POA: Diagnosis not present

## 2021-09-14 DIAGNOSIS — Z515 Encounter for palliative care: Secondary | ICD-10-CM | POA: Diagnosis not present

## 2021-09-14 DIAGNOSIS — D696 Thrombocytopenia, unspecified: Secondary | ICD-10-CM | POA: Diagnosis not present

## 2021-09-14 DIAGNOSIS — R339 Retention of urine, unspecified: Secondary | ICD-10-CM | POA: Diagnosis not present

## 2021-09-14 DIAGNOSIS — D61810 Antineoplastic chemotherapy induced pancytopenia: Secondary | ICD-10-CM | POA: Diagnosis not present

## 2021-09-14 DIAGNOSIS — I959 Hypotension, unspecified: Secondary | ICD-10-CM | POA: Diagnosis not present

## 2021-09-14 DIAGNOSIS — G620 Drug-induced polyneuropathy: Secondary | ICD-10-CM | POA: Diagnosis not present

## 2021-09-14 DIAGNOSIS — Z87891 Personal history of nicotine dependence: Secondary | ICD-10-CM | POA: Diagnosis not present

## 2021-09-14 DIAGNOSIS — G9529 Other cord compression: Secondary | ICD-10-CM | POA: Diagnosis not present

## 2021-09-14 DIAGNOSIS — R509 Fever, unspecified: Secondary | ICD-10-CM | POA: Diagnosis not present

## 2021-09-14 DIAGNOSIS — Z20822 Contact with and (suspected) exposure to covid-19: Secondary | ICD-10-CM | POA: Diagnosis not present

## 2021-09-14 DIAGNOSIS — Z8619 Personal history of other infectious and parasitic diseases: Secondary | ICD-10-CM | POA: Diagnosis not present

## 2021-09-14 DIAGNOSIS — R29898 Other symptoms and signs involving the musculoskeletal system: Secondary | ICD-10-CM | POA: Diagnosis not present

## 2021-09-14 DIAGNOSIS — Z923 Personal history of irradiation: Secondary | ICD-10-CM | POA: Diagnosis not present

## 2021-09-14 DIAGNOSIS — R0600 Dyspnea, unspecified: Secondary | ICD-10-CM | POA: Diagnosis not present

## 2021-09-14 DIAGNOSIS — C7931 Secondary malignant neoplasm of brain: Secondary | ICD-10-CM | POA: Diagnosis not present

## 2021-09-14 DIAGNOSIS — R000 Tachycardia, unspecified: Secondary | ICD-10-CM | POA: Diagnosis not present

## 2021-09-14 DIAGNOSIS — J9811 Atelectasis: Secondary | ICD-10-CM | POA: Diagnosis not present

## 2021-09-14 DIAGNOSIS — C7951 Secondary malignant neoplasm of bone: Secondary | ICD-10-CM | POA: Diagnosis not present

## 2021-09-14 DIAGNOSIS — L89153 Pressure ulcer of sacral region, stage 3: Secondary | ICD-10-CM | POA: Diagnosis not present

## 2021-09-14 DIAGNOSIS — Z8616 Personal history of COVID-19: Secondary | ICD-10-CM | POA: Diagnosis not present

## 2021-09-14 DIAGNOSIS — E43 Unspecified severe protein-calorie malnutrition: Secondary | ICD-10-CM | POA: Diagnosis not present

## 2021-09-14 DIAGNOSIS — G4700 Insomnia, unspecified: Secondary | ICD-10-CM | POA: Diagnosis not present

## 2021-09-14 DIAGNOSIS — N138 Other obstructive and reflux uropathy: Secondary | ICD-10-CM | POA: Diagnosis not present

## 2021-09-14 DIAGNOSIS — D61818 Other pancytopenia: Secondary | ICD-10-CM | POA: Diagnosis not present

## 2021-09-14 DIAGNOSIS — Z9481 Bone marrow transplant status: Secondary | ICD-10-CM | POA: Diagnosis not present

## 2021-09-14 DIAGNOSIS — Z9911 Dependence on respirator [ventilator] status: Secondary | ICD-10-CM | POA: Diagnosis not present

## 2021-09-14 DIAGNOSIS — C9020 Extramedullary plasmacytoma not having achieved remission: Secondary | ICD-10-CM | POA: Diagnosis not present

## 2021-09-14 DIAGNOSIS — E538 Deficiency of other specified B group vitamins: Secondary | ICD-10-CM | POA: Diagnosis not present

## 2021-09-14 DIAGNOSIS — J690 Pneumonitis due to inhalation of food and vomit: Secondary | ICD-10-CM | POA: Diagnosis not present

## 2021-09-14 DIAGNOSIS — L89150 Pressure ulcer of sacral region, unstageable: Secondary | ICD-10-CM | POA: Diagnosis not present

## 2021-09-14 DIAGNOSIS — M8458XA Pathological fracture in neoplastic disease, other specified site, initial encounter for fracture: Secondary | ICD-10-CM | POA: Diagnosis not present

## 2021-09-14 DIAGNOSIS — Z79899 Other long term (current) drug therapy: Secondary | ICD-10-CM | POA: Diagnosis not present

## 2021-09-14 DIAGNOSIS — Z66 Do not resuscitate: Secondary | ICD-10-CM | POA: Diagnosis not present

## 2021-09-14 DIAGNOSIS — J811 Chronic pulmonary edema: Secondary | ICD-10-CM | POA: Diagnosis not present

## 2021-09-14 DIAGNOSIS — J9601 Acute respiratory failure with hypoxia: Secondary | ICD-10-CM | POA: Diagnosis not present

## 2021-09-14 DIAGNOSIS — Z981 Arthrodesis status: Secondary | ICD-10-CM | POA: Diagnosis not present

## 2021-09-14 DIAGNOSIS — C9001 Multiple myeloma in remission: Secondary | ICD-10-CM | POA: Diagnosis not present

## 2021-09-14 DIAGNOSIS — G9520 Unspecified cord compression: Secondary | ICD-10-CM | POA: Diagnosis not present

## 2021-09-14 DIAGNOSIS — N319 Neuromuscular dysfunction of bladder, unspecified: Secondary | ICD-10-CM | POA: Diagnosis not present

## 2021-09-14 DIAGNOSIS — C9000 Multiple myeloma not having achieved remission: Secondary | ICD-10-CM | POA: Diagnosis not present

## 2021-09-14 DIAGNOSIS — R1900 Intra-abdominal and pelvic swelling, mass and lump, unspecified site: Secondary | ICD-10-CM | POA: Diagnosis not present

## 2021-09-14 DIAGNOSIS — R1312 Dysphagia, oropharyngeal phase: Secondary | ICD-10-CM | POA: Diagnosis not present

## 2021-09-14 DIAGNOSIS — C9002 Multiple myeloma in relapse: Secondary | ICD-10-CM | POA: Diagnosis not present

## 2021-09-14 DIAGNOSIS — J90 Pleural effusion, not elsewhere classified: Secondary | ICD-10-CM | POA: Diagnosis not present

## 2021-09-16 DIAGNOSIS — C9002 Multiple myeloma in relapse: Secondary | ICD-10-CM | POA: Diagnosis not present

## 2021-09-16 DIAGNOSIS — C9001 Multiple myeloma in remission: Secondary | ICD-10-CM | POA: Diagnosis not present

## 2021-09-16 DIAGNOSIS — D61818 Other pancytopenia: Secondary | ICD-10-CM | POA: Diagnosis not present

## 2021-09-16 DIAGNOSIS — G9520 Unspecified cord compression: Secondary | ICD-10-CM | POA: Diagnosis not present

## 2021-09-17 DIAGNOSIS — G9520 Unspecified cord compression: Secondary | ICD-10-CM | POA: Diagnosis not present

## 2021-09-17 DIAGNOSIS — C9002 Multiple myeloma in relapse: Secondary | ICD-10-CM | POA: Diagnosis not present

## 2021-09-18 DIAGNOSIS — G9520 Unspecified cord compression: Secondary | ICD-10-CM | POA: Diagnosis not present

## 2021-09-19 DIAGNOSIS — J9811 Atelectasis: Secondary | ICD-10-CM | POA: Diagnosis not present

## 2021-09-19 DIAGNOSIS — R509 Fever, unspecified: Secondary | ICD-10-CM | POA: Diagnosis not present

## 2021-09-19 DIAGNOSIS — G9520 Unspecified cord compression: Secondary | ICD-10-CM | POA: Diagnosis not present

## 2021-09-19 DIAGNOSIS — C9002 Multiple myeloma in relapse: Secondary | ICD-10-CM | POA: Diagnosis not present

## 2021-09-20 DIAGNOSIS — G9520 Unspecified cord compression: Secondary | ICD-10-CM | POA: Diagnosis not present

## 2021-09-21 DIAGNOSIS — C9000 Multiple myeloma not having achieved remission: Secondary | ICD-10-CM | POA: Diagnosis not present

## 2021-09-21 DIAGNOSIS — R1312 Dysphagia, oropharyngeal phase: Secondary | ICD-10-CM | POA: Diagnosis not present

## 2021-09-22 DIAGNOSIS — C9000 Multiple myeloma not having achieved remission: Secondary | ICD-10-CM | POA: Diagnosis not present

## 2021-09-22 DIAGNOSIS — R1312 Dysphagia, oropharyngeal phase: Secondary | ICD-10-CM | POA: Diagnosis not present

## 2021-09-23 DIAGNOSIS — C7951 Secondary malignant neoplasm of bone: Secondary | ICD-10-CM | POA: Diagnosis not present

## 2021-09-23 DIAGNOSIS — R1900 Intra-abdominal and pelvic swelling, mass and lump, unspecified site: Secondary | ICD-10-CM | POA: Diagnosis not present

## 2021-09-23 DIAGNOSIS — R1312 Dysphagia, oropharyngeal phase: Secondary | ICD-10-CM | POA: Diagnosis not present

## 2021-09-24 DIAGNOSIS — L89150 Pressure ulcer of sacral region, unstageable: Secondary | ICD-10-CM | POA: Diagnosis not present

## 2021-09-24 DIAGNOSIS — C7931 Secondary malignant neoplasm of brain: Secondary | ICD-10-CM | POA: Diagnosis not present

## 2021-09-24 DIAGNOSIS — C9002 Multiple myeloma in relapse: Secondary | ICD-10-CM | POA: Diagnosis not present

## 2021-09-24 DIAGNOSIS — R29898 Other symptoms and signs involving the musculoskeletal system: Secondary | ICD-10-CM | POA: Diagnosis not present

## 2021-09-24 DIAGNOSIS — G9520 Unspecified cord compression: Secondary | ICD-10-CM | POA: Diagnosis not present

## 2021-09-24 DIAGNOSIS — D61810 Antineoplastic chemotherapy induced pancytopenia: Secondary | ICD-10-CM | POA: Diagnosis not present

## 2021-09-25 DIAGNOSIS — R29898 Other symptoms and signs involving the musculoskeletal system: Secondary | ICD-10-CM | POA: Diagnosis not present

## 2021-09-25 DIAGNOSIS — G9520 Unspecified cord compression: Secondary | ICD-10-CM | POA: Diagnosis not present

## 2021-09-25 DIAGNOSIS — L89150 Pressure ulcer of sacral region, unstageable: Secondary | ICD-10-CM | POA: Diagnosis not present

## 2021-09-25 DIAGNOSIS — C9002 Multiple myeloma in relapse: Secondary | ICD-10-CM | POA: Diagnosis not present

## 2021-09-25 DIAGNOSIS — D61810 Antineoplastic chemotherapy induced pancytopenia: Secondary | ICD-10-CM | POA: Diagnosis not present

## 2021-09-26 DIAGNOSIS — L89150 Pressure ulcer of sacral region, unstageable: Secondary | ICD-10-CM | POA: Diagnosis not present

## 2021-09-26 DIAGNOSIS — R29898 Other symptoms and signs involving the musculoskeletal system: Secondary | ICD-10-CM | POA: Diagnosis not present

## 2021-09-26 DIAGNOSIS — G9520 Unspecified cord compression: Secondary | ICD-10-CM | POA: Diagnosis not present

## 2021-09-26 DIAGNOSIS — C9002 Multiple myeloma in relapse: Secondary | ICD-10-CM | POA: Diagnosis not present

## 2021-09-26 DIAGNOSIS — D61810 Antineoplastic chemotherapy induced pancytopenia: Secondary | ICD-10-CM | POA: Diagnosis not present

## 2021-09-27 DIAGNOSIS — L89153 Pressure ulcer of sacral region, stage 3: Secondary | ICD-10-CM | POA: Diagnosis not present

## 2021-09-27 DIAGNOSIS — D61810 Antineoplastic chemotherapy induced pancytopenia: Secondary | ICD-10-CM | POA: Diagnosis not present

## 2021-09-27 DIAGNOSIS — L89150 Pressure ulcer of sacral region, unstageable: Secondary | ICD-10-CM | POA: Diagnosis not present

## 2021-09-27 DIAGNOSIS — C9002 Multiple myeloma in relapse: Secondary | ICD-10-CM | POA: Diagnosis not present

## 2021-09-27 DIAGNOSIS — G9520 Unspecified cord compression: Secondary | ICD-10-CM | POA: Diagnosis not present

## 2021-09-27 DIAGNOSIS — R29898 Other symptoms and signs involving the musculoskeletal system: Secondary | ICD-10-CM | POA: Diagnosis not present

## 2021-09-28 DIAGNOSIS — R29898 Other symptoms and signs involving the musculoskeletal system: Secondary | ICD-10-CM | POA: Diagnosis not present

## 2021-09-28 DIAGNOSIS — C9002 Multiple myeloma in relapse: Secondary | ICD-10-CM | POA: Diagnosis not present

## 2021-09-28 DIAGNOSIS — G9520 Unspecified cord compression: Secondary | ICD-10-CM | POA: Diagnosis not present

## 2021-09-28 DIAGNOSIS — D61810 Antineoplastic chemotherapy induced pancytopenia: Secondary | ICD-10-CM | POA: Diagnosis not present

## 2021-09-28 DIAGNOSIS — L89150 Pressure ulcer of sacral region, unstageable: Secondary | ICD-10-CM | POA: Diagnosis not present

## 2021-09-29 DIAGNOSIS — R29898 Other symptoms and signs involving the musculoskeletal system: Secondary | ICD-10-CM | POA: Diagnosis not present

## 2021-09-29 DIAGNOSIS — C9002 Multiple myeloma in relapse: Secondary | ICD-10-CM | POA: Diagnosis not present

## 2021-09-29 DIAGNOSIS — D61810 Antineoplastic chemotherapy induced pancytopenia: Secondary | ICD-10-CM | POA: Diagnosis not present

## 2021-09-29 DIAGNOSIS — G9520 Unspecified cord compression: Secondary | ICD-10-CM | POA: Diagnosis not present

## 2021-09-29 DIAGNOSIS — L89150 Pressure ulcer of sacral region, unstageable: Secondary | ICD-10-CM | POA: Diagnosis not present

## 2021-09-30 DIAGNOSIS — G9520 Unspecified cord compression: Secondary | ICD-10-CM | POA: Diagnosis not present

## 2021-09-30 DIAGNOSIS — D61810 Antineoplastic chemotherapy induced pancytopenia: Secondary | ICD-10-CM | POA: Diagnosis not present

## 2021-09-30 DIAGNOSIS — C9002 Multiple myeloma in relapse: Secondary | ICD-10-CM | POA: Diagnosis not present

## 2021-09-30 DIAGNOSIS — L89150 Pressure ulcer of sacral region, unstageable: Secondary | ICD-10-CM | POA: Diagnosis not present

## 2021-09-30 DIAGNOSIS — R29898 Other symptoms and signs involving the musculoskeletal system: Secondary | ICD-10-CM | POA: Diagnosis not present

## 2021-10-01 DIAGNOSIS — R339 Retention of urine, unspecified: Secondary | ICD-10-CM | POA: Diagnosis not present

## 2021-10-01 DIAGNOSIS — N319 Neuromuscular dysfunction of bladder, unspecified: Secondary | ICD-10-CM | POA: Diagnosis not present

## 2021-10-01 DIAGNOSIS — D61818 Other pancytopenia: Secondary | ICD-10-CM | POA: Diagnosis not present

## 2021-10-01 DIAGNOSIS — Z8619 Personal history of other infectious and parasitic diseases: Secondary | ICD-10-CM | POA: Diagnosis not present

## 2021-10-01 DIAGNOSIS — I959 Hypotension, unspecified: Secondary | ICD-10-CM | POA: Diagnosis not present

## 2021-10-01 DIAGNOSIS — G9520 Unspecified cord compression: Secondary | ICD-10-CM | POA: Diagnosis not present

## 2021-10-01 DIAGNOSIS — C9002 Multiple myeloma in relapse: Secondary | ICD-10-CM | POA: Diagnosis not present

## 2021-10-01 DIAGNOSIS — G893 Neoplasm related pain (acute) (chronic): Secondary | ICD-10-CM | POA: Diagnosis not present

## 2021-10-01 DIAGNOSIS — R509 Fever, unspecified: Secondary | ICD-10-CM | POA: Diagnosis not present

## 2021-10-01 DIAGNOSIS — E538 Deficiency of other specified B group vitamins: Secondary | ICD-10-CM | POA: Diagnosis not present

## 2021-10-01 DIAGNOSIS — L89150 Pressure ulcer of sacral region, unstageable: Secondary | ICD-10-CM | POA: Diagnosis not present

## 2021-10-01 DIAGNOSIS — E43 Unspecified severe protein-calorie malnutrition: Secondary | ICD-10-CM | POA: Diagnosis not present

## 2021-10-02 DIAGNOSIS — C9002 Multiple myeloma in relapse: Secondary | ICD-10-CM | POA: Diagnosis not present

## 2021-10-02 DIAGNOSIS — L89150 Pressure ulcer of sacral region, unstageable: Secondary | ICD-10-CM | POA: Diagnosis not present

## 2021-10-02 DIAGNOSIS — G9520 Unspecified cord compression: Secondary | ICD-10-CM | POA: Diagnosis not present

## 2021-10-02 DIAGNOSIS — R509 Fever, unspecified: Secondary | ICD-10-CM | POA: Diagnosis not present

## 2021-10-02 DIAGNOSIS — E43 Unspecified severe protein-calorie malnutrition: Secondary | ICD-10-CM | POA: Diagnosis not present

## 2021-10-03 DIAGNOSIS — R339 Retention of urine, unspecified: Secondary | ICD-10-CM | POA: Diagnosis not present

## 2021-10-03 DIAGNOSIS — I959 Hypotension, unspecified: Secondary | ICD-10-CM | POA: Diagnosis not present

## 2021-10-03 DIAGNOSIS — E43 Unspecified severe protein-calorie malnutrition: Secondary | ICD-10-CM | POA: Diagnosis not present

## 2021-10-03 DIAGNOSIS — G9520 Unspecified cord compression: Secondary | ICD-10-CM | POA: Diagnosis not present

## 2021-10-03 DIAGNOSIS — D61818 Other pancytopenia: Secondary | ICD-10-CM | POA: Diagnosis not present

## 2021-10-03 DIAGNOSIS — R1312 Dysphagia, oropharyngeal phase: Secondary | ICD-10-CM | POA: Diagnosis not present

## 2021-10-03 DIAGNOSIS — R509 Fever, unspecified: Secondary | ICD-10-CM | POA: Diagnosis not present

## 2021-10-03 DIAGNOSIS — L89150 Pressure ulcer of sacral region, unstageable: Secondary | ICD-10-CM | POA: Diagnosis not present

## 2021-10-03 DIAGNOSIS — N319 Neuromuscular dysfunction of bladder, unspecified: Secondary | ICD-10-CM | POA: Diagnosis not present

## 2021-10-03 DIAGNOSIS — E538 Deficiency of other specified B group vitamins: Secondary | ICD-10-CM | POA: Diagnosis not present

## 2021-10-03 DIAGNOSIS — C9000 Multiple myeloma not having achieved remission: Secondary | ICD-10-CM | POA: Diagnosis not present

## 2021-10-03 DIAGNOSIS — Z8619 Personal history of other infectious and parasitic diseases: Secondary | ICD-10-CM | POA: Diagnosis not present

## 2021-10-03 DIAGNOSIS — G893 Neoplasm related pain (acute) (chronic): Secondary | ICD-10-CM | POA: Diagnosis not present

## 2021-10-03 DIAGNOSIS — C9002 Multiple myeloma in relapse: Secondary | ICD-10-CM | POA: Diagnosis not present

## 2021-10-04 ENCOUNTER — Telehealth: Payer: Self-pay

## 2021-10-04 ENCOUNTER — Ambulatory Visit: Payer: Medicare Other

## 2021-10-04 DIAGNOSIS — J9811 Atelectasis: Secondary | ICD-10-CM | POA: Diagnosis not present

## 2021-10-04 DIAGNOSIS — I959 Hypotension, unspecified: Secondary | ICD-10-CM | POA: Diagnosis not present

## 2021-10-04 DIAGNOSIS — G9520 Unspecified cord compression: Secondary | ICD-10-CM | POA: Diagnosis not present

## 2021-10-04 DIAGNOSIS — G893 Neoplasm related pain (acute) (chronic): Secondary | ICD-10-CM | POA: Diagnosis not present

## 2021-10-04 DIAGNOSIS — L89150 Pressure ulcer of sacral region, unstageable: Secondary | ICD-10-CM | POA: Diagnosis not present

## 2021-10-04 DIAGNOSIS — R339 Retention of urine, unspecified: Secondary | ICD-10-CM | POA: Diagnosis not present

## 2021-10-04 DIAGNOSIS — E538 Deficiency of other specified B group vitamins: Secondary | ICD-10-CM | POA: Diagnosis not present

## 2021-10-04 DIAGNOSIS — D61818 Other pancytopenia: Secondary | ICD-10-CM | POA: Diagnosis not present

## 2021-10-04 DIAGNOSIS — C9002 Multiple myeloma in relapse: Secondary | ICD-10-CM | POA: Diagnosis not present

## 2021-10-04 DIAGNOSIS — E43 Unspecified severe protein-calorie malnutrition: Secondary | ICD-10-CM | POA: Diagnosis not present

## 2021-10-04 DIAGNOSIS — Z8619 Personal history of other infectious and parasitic diseases: Secondary | ICD-10-CM | POA: Diagnosis not present

## 2021-10-04 DIAGNOSIS — J90 Pleural effusion, not elsewhere classified: Secondary | ICD-10-CM | POA: Diagnosis not present

## 2021-10-04 DIAGNOSIS — N319 Neuromuscular dysfunction of bladder, unspecified: Secondary | ICD-10-CM | POA: Diagnosis not present

## 2021-10-04 DIAGNOSIS — J811 Chronic pulmonary edema: Secondary | ICD-10-CM | POA: Diagnosis not present

## 2021-10-04 DIAGNOSIS — R509 Fever, unspecified: Secondary | ICD-10-CM | POA: Diagnosis not present

## 2021-10-04 NOTE — Telephone Encounter (Signed)
I called the patient to schedule his medicare wellness and was told that he is currently in the hospital in critical state.

## 2021-10-05 DIAGNOSIS — R339 Retention of urine, unspecified: Secondary | ICD-10-CM | POA: Diagnosis not present

## 2021-10-05 DIAGNOSIS — R509 Fever, unspecified: Secondary | ICD-10-CM | POA: Diagnosis not present

## 2021-10-05 DIAGNOSIS — L89150 Pressure ulcer of sacral region, unstageable: Secondary | ICD-10-CM | POA: Diagnosis not present

## 2021-10-05 DIAGNOSIS — G9520 Unspecified cord compression: Secondary | ICD-10-CM | POA: Diagnosis not present

## 2021-10-05 DIAGNOSIS — G893 Neoplasm related pain (acute) (chronic): Secondary | ICD-10-CM | POA: Diagnosis not present

## 2021-10-05 DIAGNOSIS — C9002 Multiple myeloma in relapse: Secondary | ICD-10-CM | POA: Diagnosis not present

## 2021-10-05 DIAGNOSIS — Z8619 Personal history of other infectious and parasitic diseases: Secondary | ICD-10-CM | POA: Diagnosis not present

## 2021-10-05 DIAGNOSIS — N319 Neuromuscular dysfunction of bladder, unspecified: Secondary | ICD-10-CM | POA: Diagnosis not present

## 2021-10-05 DIAGNOSIS — I959 Hypotension, unspecified: Secondary | ICD-10-CM | POA: Diagnosis not present

## 2021-10-05 DIAGNOSIS — E43 Unspecified severe protein-calorie malnutrition: Secondary | ICD-10-CM | POA: Diagnosis not present

## 2021-10-05 DIAGNOSIS — D61818 Other pancytopenia: Secondary | ICD-10-CM | POA: Diagnosis not present

## 2021-10-05 DIAGNOSIS — E538 Deficiency of other specified B group vitamins: Secondary | ICD-10-CM | POA: Diagnosis not present

## 2021-10-06 DIAGNOSIS — G9520 Unspecified cord compression: Secondary | ICD-10-CM | POA: Diagnosis not present

## 2021-10-06 DIAGNOSIS — C9002 Multiple myeloma in relapse: Secondary | ICD-10-CM | POA: Diagnosis not present

## 2021-10-06 DIAGNOSIS — E43 Unspecified severe protein-calorie malnutrition: Secondary | ICD-10-CM | POA: Diagnosis not present

## 2021-10-06 DIAGNOSIS — L89150 Pressure ulcer of sacral region, unstageable: Secondary | ICD-10-CM | POA: Diagnosis not present

## 2021-10-06 DIAGNOSIS — R509 Fever, unspecified: Secondary | ICD-10-CM | POA: Diagnosis not present

## 2021-10-07 DIAGNOSIS — C9002 Multiple myeloma in relapse: Secondary | ICD-10-CM | POA: Diagnosis not present

## 2021-10-07 DIAGNOSIS — E43 Unspecified severe protein-calorie malnutrition: Secondary | ICD-10-CM | POA: Diagnosis not present

## 2021-10-07 DIAGNOSIS — L89150 Pressure ulcer of sacral region, unstageable: Secondary | ICD-10-CM | POA: Diagnosis not present

## 2021-10-07 DIAGNOSIS — G9520 Unspecified cord compression: Secondary | ICD-10-CM | POA: Diagnosis not present

## 2021-10-07 DIAGNOSIS — R509 Fever, unspecified: Secondary | ICD-10-CM | POA: Diagnosis not present

## 2021-10-08 DIAGNOSIS — Z981 Arthrodesis status: Secondary | ICD-10-CM | POA: Diagnosis not present

## 2021-10-08 DIAGNOSIS — E538 Deficiency of other specified B group vitamins: Secondary | ICD-10-CM | POA: Diagnosis not present

## 2021-10-08 DIAGNOSIS — M8458XA Pathological fracture in neoplastic disease, other specified site, initial encounter for fracture: Secondary | ICD-10-CM | POA: Diagnosis not present

## 2021-10-08 DIAGNOSIS — N138 Other obstructive and reflux uropathy: Secondary | ICD-10-CM | POA: Diagnosis not present

## 2021-10-08 DIAGNOSIS — E43 Unspecified severe protein-calorie malnutrition: Secondary | ICD-10-CM | POA: Diagnosis not present

## 2021-10-08 DIAGNOSIS — Z79899 Other long term (current) drug therapy: Secondary | ICD-10-CM | POA: Diagnosis not present

## 2021-10-08 DIAGNOSIS — N319 Neuromuscular dysfunction of bladder, unspecified: Secondary | ICD-10-CM | POA: Diagnosis not present

## 2021-10-08 DIAGNOSIS — G620 Drug-induced polyneuropathy: Secondary | ICD-10-CM | POA: Diagnosis not present

## 2021-10-08 DIAGNOSIS — Z8616 Personal history of COVID-19: Secondary | ICD-10-CM | POA: Diagnosis not present

## 2021-10-08 DIAGNOSIS — Z923 Personal history of irradiation: Secondary | ICD-10-CM | POA: Diagnosis not present

## 2021-10-08 DIAGNOSIS — Z66 Do not resuscitate: Secondary | ICD-10-CM | POA: Diagnosis not present

## 2021-10-08 DIAGNOSIS — Z87891 Personal history of nicotine dependence: Secondary | ICD-10-CM | POA: Diagnosis not present

## 2021-10-08 DIAGNOSIS — L89153 Pressure ulcer of sacral region, stage 3: Secondary | ICD-10-CM | POA: Diagnosis not present

## 2021-10-08 DIAGNOSIS — Z20822 Contact with and (suspected) exposure to covid-19: Secondary | ICD-10-CM | POA: Diagnosis not present

## 2021-10-08 DIAGNOSIS — D61818 Other pancytopenia: Secondary | ICD-10-CM | POA: Diagnosis not present

## 2021-10-08 DIAGNOSIS — G4700 Insomnia, unspecified: Secondary | ICD-10-CM | POA: Diagnosis not present

## 2021-10-08 DIAGNOSIS — G893 Neoplasm related pain (acute) (chronic): Secondary | ICD-10-CM | POA: Diagnosis not present

## 2021-10-08 DIAGNOSIS — J9601 Acute respiratory failure with hypoxia: Secondary | ICD-10-CM | POA: Diagnosis not present

## 2021-10-08 DIAGNOSIS — C9002 Multiple myeloma in relapse: Secondary | ICD-10-CM | POA: Diagnosis not present

## 2021-10-08 DIAGNOSIS — Z515 Encounter for palliative care: Secondary | ICD-10-CM | POA: Diagnosis not present

## 2021-10-08 DIAGNOSIS — Z9481 Bone marrow transplant status: Secondary | ICD-10-CM | POA: Diagnosis not present

## 2021-10-08 DIAGNOSIS — I959 Hypotension, unspecified: Secondary | ICD-10-CM | POA: Diagnosis not present

## 2021-10-08 DIAGNOSIS — G9529 Other cord compression: Secondary | ICD-10-CM | POA: Diagnosis not present

## 2021-10-08 DIAGNOSIS — J690 Pneumonitis due to inhalation of food and vomit: Secondary | ICD-10-CM | POA: Diagnosis not present

## 2021-10-31 ENCOUNTER — Encounter: Payer: Medicare Other | Admitting: Family Medicine

## 2021-11-05 DEATH — deceased
